# Patient Record
Sex: Male | Born: 1935
Health system: Southern US, Community
[De-identification: ages and names within clinical notes are randomized; demographics above are authoritative.]

## PROBLEM LIST (undated history)

## (undated) DIAGNOSIS — M6282 Rhabdomyolysis: Secondary | ICD-10-CM

## (undated) DIAGNOSIS — H9190 Unspecified hearing loss, unspecified ear: Secondary | ICD-10-CM

## (undated) DIAGNOSIS — I951 Orthostatic hypotension: Secondary | ICD-10-CM

## (undated) DIAGNOSIS — B004 Herpesviral encephalitis: Secondary | ICD-10-CM

## (undated) DIAGNOSIS — R911 Solitary pulmonary nodule: Secondary | ICD-10-CM

## (undated) DIAGNOSIS — I480 Paroxysmal atrial fibrillation: Secondary | ICD-10-CM

## (undated) DIAGNOSIS — K5790 Diverticulosis of intestine, part unspecified, without perforation or abscess without bleeding: Secondary | ICD-10-CM

## (undated) DIAGNOSIS — F0390 Unspecified dementia without behavioral disturbance: Secondary | ICD-10-CM

## (undated) DIAGNOSIS — E785 Hyperlipidemia, unspecified: Secondary | ICD-10-CM

## (undated) DIAGNOSIS — R002 Palpitations: Secondary | ICD-10-CM

## (undated) DIAGNOSIS — R413 Other amnesia: Secondary | ICD-10-CM

## (undated) DIAGNOSIS — I2510 Atherosclerotic heart disease of native coronary artery without angina pectoris: Secondary | ICD-10-CM

## (undated) DIAGNOSIS — E871 Hypo-osmolality and hyponatremia: Secondary | ICD-10-CM

## (undated) DIAGNOSIS — R0602 Shortness of breath: Secondary | ICD-10-CM

## (undated) DIAGNOSIS — E119 Type 2 diabetes mellitus without complications: Secondary | ICD-10-CM

## (undated) HISTORY — DX: Type 2 diabetes mellitus without complications: E11.9

## (undated) HISTORY — PX: HERNIA REPAIR: SHX51

## (undated) HISTORY — DX: Atherosclerotic heart disease of native coronary artery without angina pectoris: I25.10

## (undated) HISTORY — PX: TONSILLECTOMY: SUR1361

## (undated) HISTORY — DX: Unspecified hearing loss, unspecified ear: H91.90

## (undated) HISTORY — DX: Hyperlipidemia, unspecified: E78.5

## (undated) HISTORY — DX: Unspecified dementia without behavioral disturbance: F03.90

## (undated) HISTORY — DX: Paroxysmal atrial fibrillation: I48.0

## (undated) HISTORY — DX: Hypo-osmolality and hyponatremia: E87.1

## (undated) HISTORY — DX: Rhabdomyolysis: M62.82

## (undated) HISTORY — DX: Herpesviral encephalitis: B00.4

## (undated) HISTORY — DX: Palpitations: R00.2

## (undated) HISTORY — DX: Shortness of breath: R06.02

## (undated) HISTORY — DX: Orthostatic hypotension: I95.1

## (undated) HISTORY — DX: Solitary pulmonary nodule: R91.1

## (undated) HISTORY — DX: Other amnesia: R41.3

## (undated) HISTORY — DX: Diverticulosis of intestine, part unspecified, without perforation or abscess without bleeding: K57.90

---

## 2001-09-28 ENCOUNTER — Emergency Department (HOSPITAL_COMMUNITY): Admission: EM | Admit: 2001-09-28 | Discharge: 2001-09-28 | Payer: Self-pay | Admitting: *Deleted

## 2001-09-28 ENCOUNTER — Encounter: Payer: Self-pay | Admitting: *Deleted

## 2002-05-28 ENCOUNTER — Encounter: Payer: Self-pay | Admitting: Family Medicine

## 2002-05-28 ENCOUNTER — Ambulatory Visit (HOSPITAL_COMMUNITY): Admission: RE | Admit: 2002-05-28 | Discharge: 2002-05-28 | Payer: Self-pay | Admitting: Family Medicine

## 2003-01-07 ENCOUNTER — Encounter (INDEPENDENT_AMBULATORY_CARE_PROVIDER_SITE_OTHER): Payer: Self-pay | Admitting: *Deleted

## 2003-01-07 ENCOUNTER — Ambulatory Visit (HOSPITAL_COMMUNITY): Admission: RE | Admit: 2003-01-07 | Discharge: 2003-01-07 | Payer: Self-pay | Admitting: Internal Medicine

## 2005-06-18 ENCOUNTER — Ambulatory Visit (HOSPITAL_COMMUNITY): Admission: RE | Admit: 2005-06-18 | Discharge: 2005-06-18 | Payer: Self-pay | Admitting: Family Medicine

## 2005-06-21 ENCOUNTER — Ambulatory Visit (HOSPITAL_COMMUNITY): Admission: RE | Admit: 2005-06-21 | Discharge: 2005-06-21 | Payer: Self-pay | Admitting: Family Medicine

## 2005-10-10 ENCOUNTER — Ambulatory Visit (HOSPITAL_COMMUNITY): Admission: RE | Admit: 2005-10-10 | Discharge: 2005-10-10 | Payer: Self-pay | Admitting: *Deleted

## 2006-05-06 ENCOUNTER — Ambulatory Visit: Payer: Self-pay | Admitting: Critical Care Medicine

## 2006-05-19 ENCOUNTER — Ambulatory Visit: Payer: Self-pay | Admitting: Critical Care Medicine

## 2006-05-19 ENCOUNTER — Encounter (INDEPENDENT_AMBULATORY_CARE_PROVIDER_SITE_OTHER): Payer: Self-pay | Admitting: *Deleted

## 2006-05-19 ENCOUNTER — Ambulatory Visit: Admission: RE | Admit: 2006-05-19 | Discharge: 2006-05-19 | Payer: Self-pay | Admitting: Critical Care Medicine

## 2006-06-02 ENCOUNTER — Ambulatory Visit (HOSPITAL_COMMUNITY): Admission: RE | Admit: 2006-06-02 | Discharge: 2006-06-02 | Payer: Self-pay | Admitting: Critical Care Medicine

## 2006-08-28 ENCOUNTER — Ambulatory Visit: Payer: Self-pay | Admitting: Critical Care Medicine

## 2006-08-28 LAB — CONVERTED CEMR LAB
CO2: 29 meq/L (ref 19–32)
Calcium: 8.9 mg/dL (ref 8.4–10.5)
Chloride: 102 meq/L (ref 96–112)
GFR calc Af Amer: 122 mL/min
Glucose, Bld: 149 mg/dL — ABNORMAL HIGH (ref 70–99)
Sodium: 136 meq/L (ref 135–145)

## 2006-09-01 ENCOUNTER — Ambulatory Visit (HOSPITAL_COMMUNITY): Admission: RE | Admit: 2006-09-01 | Discharge: 2006-09-01 | Payer: Self-pay | Admitting: Critical Care Medicine

## 2006-09-10 ENCOUNTER — Ambulatory Visit (HOSPITAL_COMMUNITY): Admission: RE | Admit: 2006-09-10 | Discharge: 2006-09-10 | Payer: Self-pay | Admitting: Family Medicine

## 2006-12-01 ENCOUNTER — Ambulatory Visit: Payer: Self-pay | Admitting: Critical Care Medicine

## 2006-12-01 LAB — CONVERTED CEMR LAB
Chloride: 104 meq/L (ref 96–112)
Creatinine, Ser: 0.8 mg/dL (ref 0.4–1.5)
Glucose, Bld: 169 mg/dL — ABNORMAL HIGH (ref 70–99)
Potassium: 4.3 meq/L (ref 3.5–5.1)
Sodium: 139 meq/L (ref 135–145)

## 2006-12-02 ENCOUNTER — Ambulatory Visit: Payer: Self-pay | Admitting: Cardiology

## 2007-07-15 ENCOUNTER — Ambulatory Visit (HOSPITAL_COMMUNITY): Admission: RE | Admit: 2007-07-15 | Discharge: 2007-07-15 | Payer: Self-pay | Admitting: *Deleted

## 2007-07-22 ENCOUNTER — Encounter: Payer: Self-pay | Admitting: Cardiovascular Disease

## 2007-08-17 ENCOUNTER — Encounter (INDEPENDENT_AMBULATORY_CARE_PROVIDER_SITE_OTHER): Payer: Self-pay | Admitting: *Deleted

## 2007-08-17 DIAGNOSIS — J984 Other disorders of lung: Secondary | ICD-10-CM | POA: Insufficient documentation

## 2007-09-10 ENCOUNTER — Ambulatory Visit: Payer: Self-pay | Admitting: Cardiology

## 2007-09-11 DIAGNOSIS — I4891 Unspecified atrial fibrillation: Secondary | ICD-10-CM | POA: Insufficient documentation

## 2007-09-11 DIAGNOSIS — E119 Type 2 diabetes mellitus without complications: Secondary | ICD-10-CM | POA: Insufficient documentation

## 2007-09-14 ENCOUNTER — Ambulatory Visit: Payer: Self-pay | Admitting: Critical Care Medicine

## 2007-09-14 DIAGNOSIS — I10 Essential (primary) hypertension: Secondary | ICD-10-CM | POA: Insufficient documentation

## 2007-10-08 ENCOUNTER — Encounter: Payer: Self-pay | Admitting: Critical Care Medicine

## 2007-11-23 IMAGING — CT CT PELVIS W/ CM
2 of 5 series · 15 of 46 positions shown, 17 images · IV contrast (Omnipaque 300)
Comparison: None.

CLINICAL DATA: Abdominal pain. 
 ABDOMEN CT WITH CONTRAST ([DATE] HOURS):
TECHNIQUE: Multidetector CT imaging of the abdomen was performed following the standard protocol during bolus administration of intravenous contrast.
 Contrast:  100 cc Omnipaque 300
TECHNIQUE: Multidetector CT imaging of the pelvis was performed following the standard protocol during bolus administration of intravenous contrast.

[Series 2: abd_pel 5.0 b40f · axial · 0.77mm/px · z∈[-484,-88]mm · 12 of 95 slices shown, 14 images]
[im 8/95  soft-tissue]
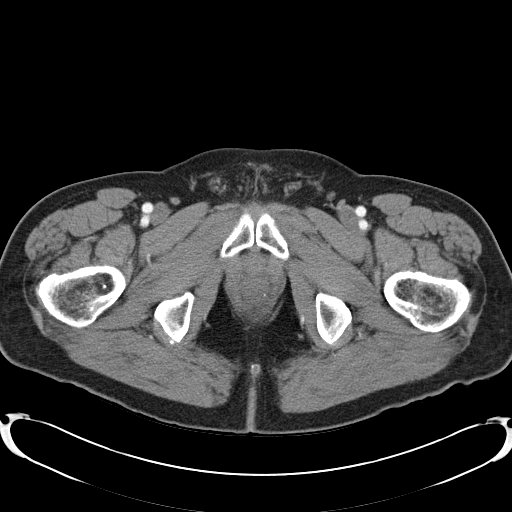
[im 8/95  bone]
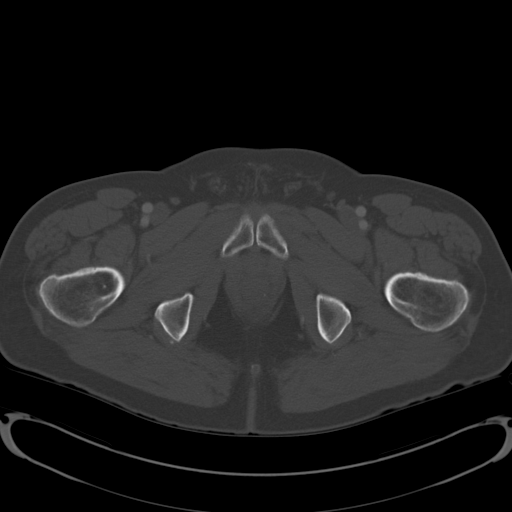
[im 15/95  soft-tissue]
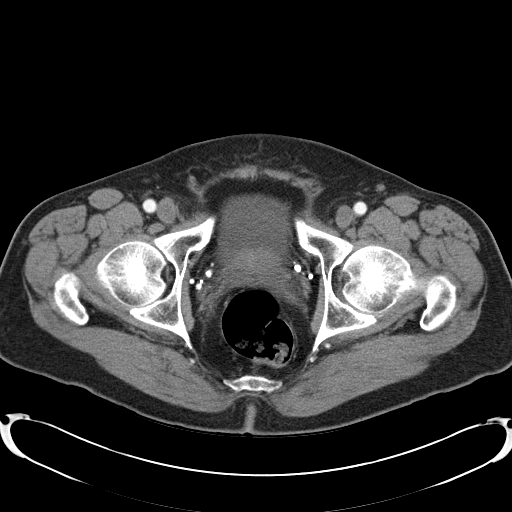
[im 22/95  soft-tissue]
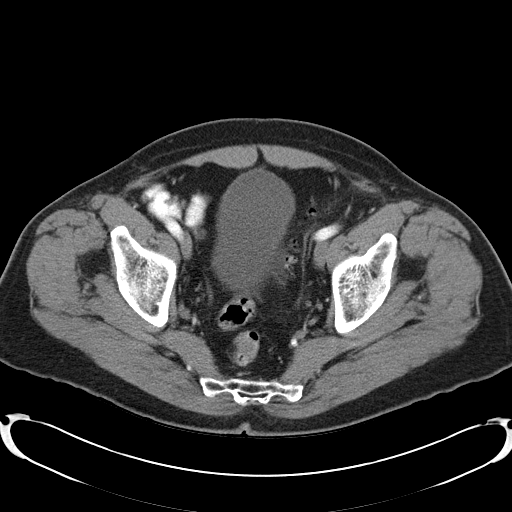
[im 29/95  soft-tissue]
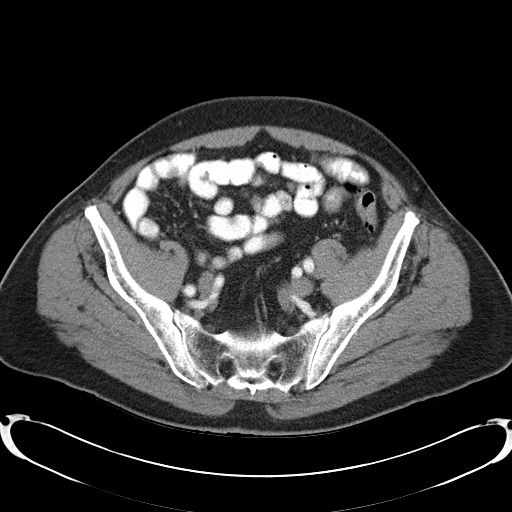
[im 37/95  soft-tissue]
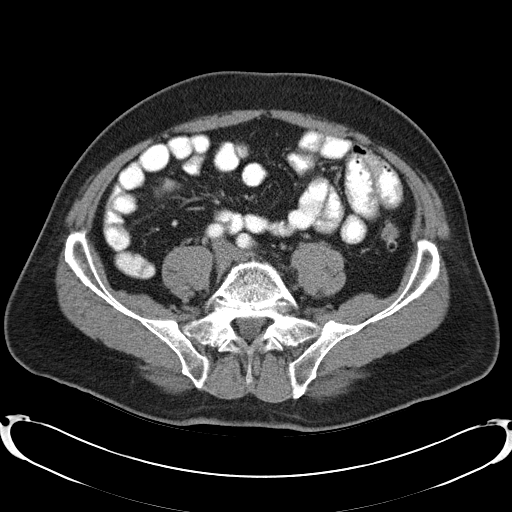
[im 44/95  soft-tissue]
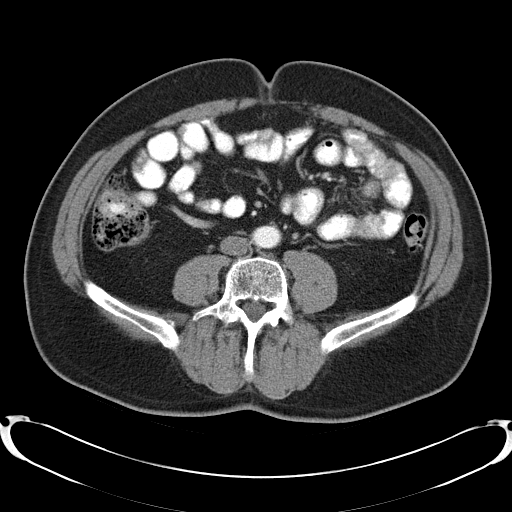
[im 51/95  soft-tissue]
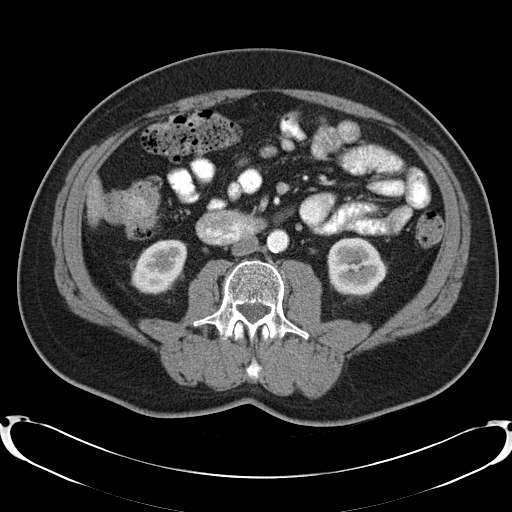
[im 58/95  soft-tissue]
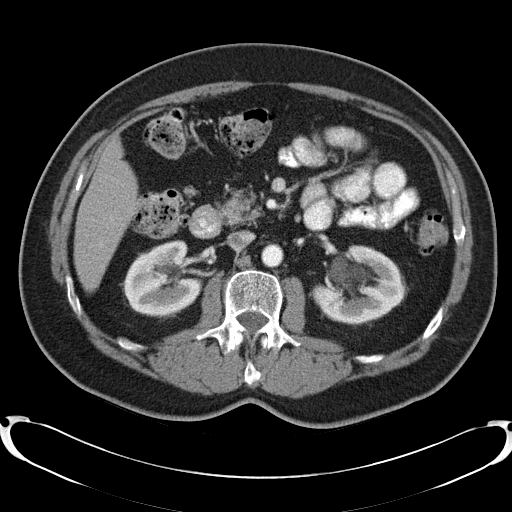
[im 66/95  soft-tissue]
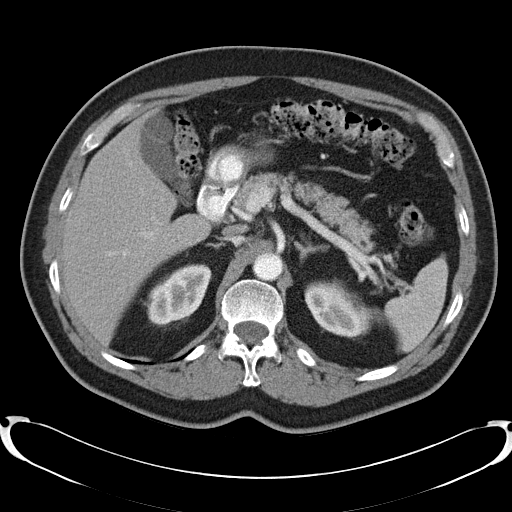
[im 66/95  bone]
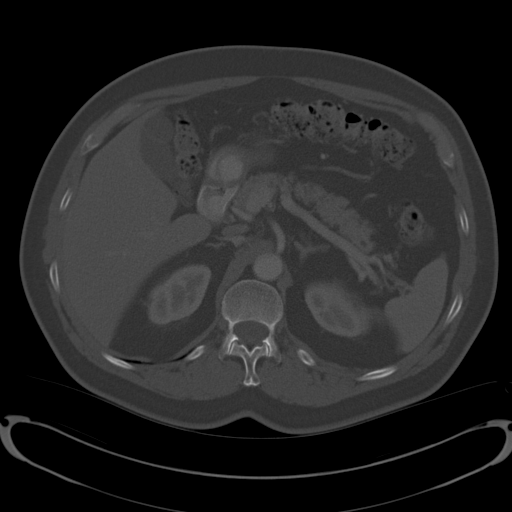
[im 73/95  soft-tissue]
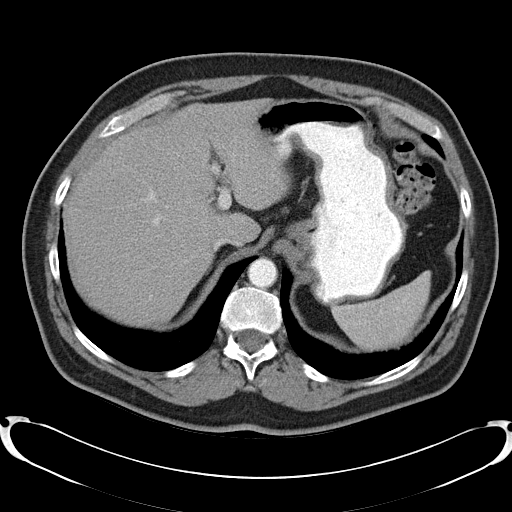
[im 80/95  soft-tissue]
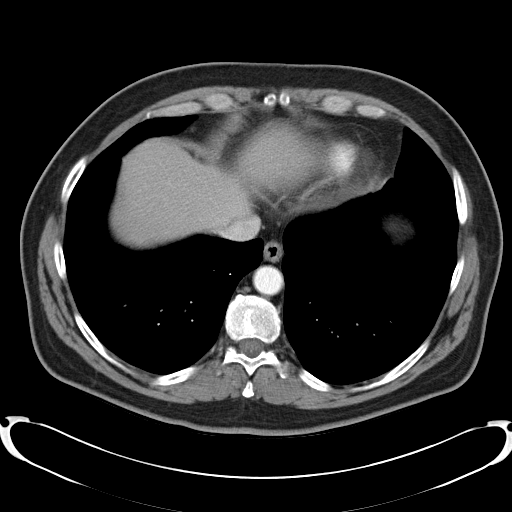
[im 87/95  soft-tissue]
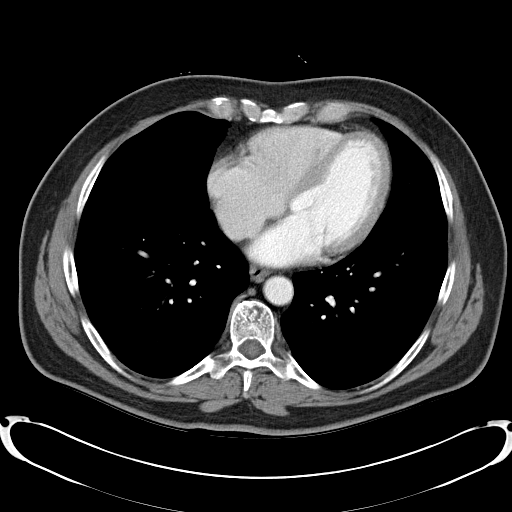

[Series 4: mpr coronal a/p · coronal · 0.73mm/px · 3 of 81 slices shown]
[im 27/81  soft-tissue]
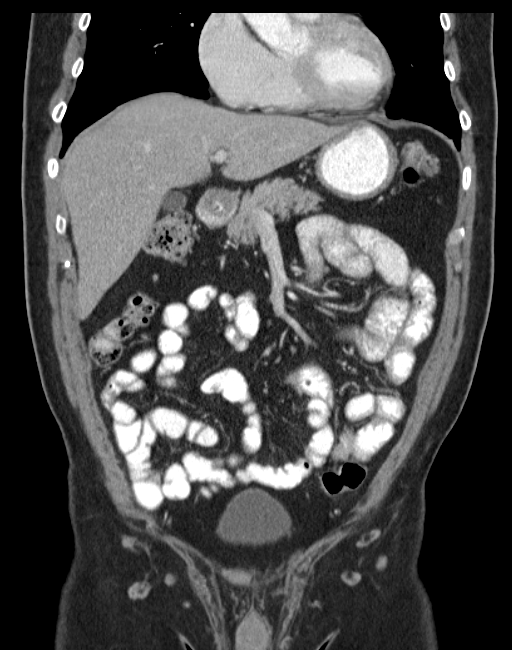
[im 36/81  soft-tissue]
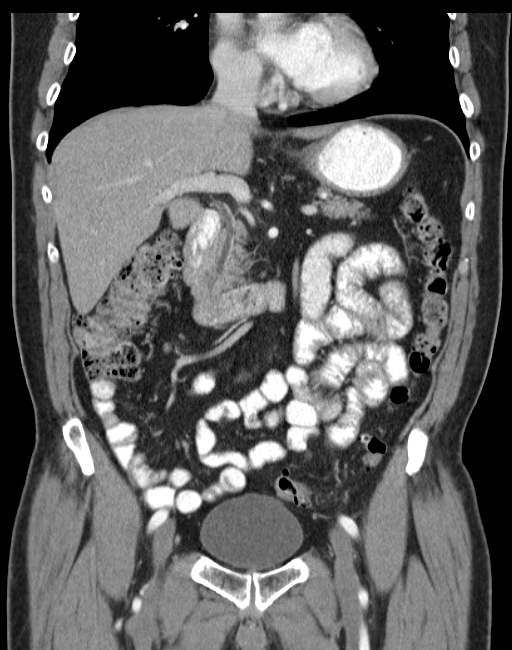
[im 45/81  soft-tissue]
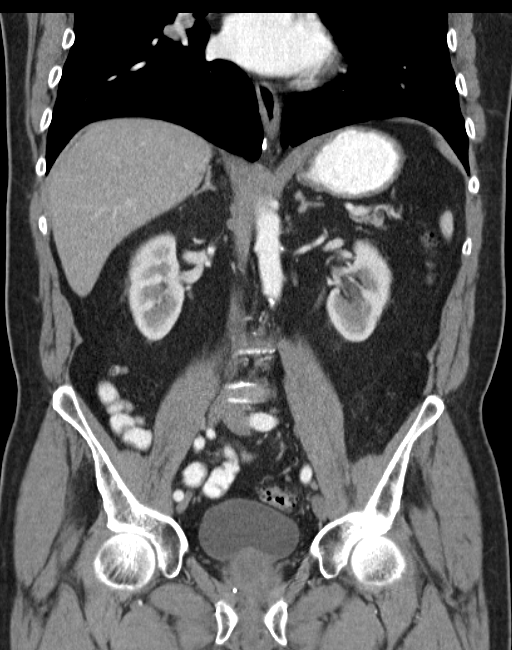

[15 of 46 positions shown; findings below may reference images not displayed]

FINDINGS: A left lower lobe nodule on image 17 is unchanged compared to the chest CT from 09/01/06. 
 Calcified gallstones are again present.  Stool is seen throughout the colon.  The spleen, adrenal glands, and pancreas are within normal limits.  Bilateral pelvic cysts are seen in the kidneys.  Negative free fluid or abnormal adenopathy.
IMPRESSION: 1.  Stable left lower lobe nodule.
 2.  Cholelithiasis. 
 3.  Prominent stool throughout the colon. 
 PELVIS CT WITH CONTRAST:
FINDINGS: The appendix is normal.  Sigmoid diverticulosis without evidence of diverticulitis is present.  The bladder and prostate are within normal limits.  Negative free fluid or abnormal adenopathy.  Spondylolisthesis grade I at L5-S1 is stable.
IMPRESSION: No acute intrapelvic pathology.  Stable exam.

## 2008-08-11 ENCOUNTER — Encounter (HOSPITAL_COMMUNITY): Admission: RE | Admit: 2008-08-11 | Discharge: 2008-09-10 | Payer: Self-pay | Admitting: Family Medicine

## 2008-08-16 ENCOUNTER — Encounter: Payer: Self-pay | Admitting: Internal Medicine

## 2008-09-08 ENCOUNTER — Encounter: Payer: Self-pay | Admitting: Internal Medicine

## 2008-12-13 ENCOUNTER — Encounter: Payer: Self-pay | Admitting: Internal Medicine

## 2008-12-14 ENCOUNTER — Encounter: Payer: Self-pay | Admitting: Internal Medicine

## 2008-12-14 ENCOUNTER — Encounter (INDEPENDENT_AMBULATORY_CARE_PROVIDER_SITE_OTHER): Payer: Self-pay | Admitting: *Deleted

## 2008-12-14 ENCOUNTER — Ambulatory Visit (HOSPITAL_COMMUNITY): Admission: RE | Admit: 2008-12-14 | Discharge: 2008-12-14 | Payer: Self-pay | Admitting: Family Medicine

## 2008-12-19 ENCOUNTER — Encounter: Payer: Self-pay | Admitting: Internal Medicine

## 2009-01-10 ENCOUNTER — Encounter: Payer: Self-pay | Admitting: Internal Medicine

## 2009-03-01 ENCOUNTER — Ambulatory Visit: Payer: Self-pay | Admitting: Internal Medicine

## 2009-03-01 DIAGNOSIS — K5732 Diverticulitis of large intestine without perforation or abscess without bleeding: Secondary | ICD-10-CM | POA: Insufficient documentation

## 2010-01-30 ENCOUNTER — Inpatient Hospital Stay (HOSPITAL_COMMUNITY): Admission: EM | Admit: 2010-01-30 | Discharge: 2010-02-12 | Payer: Self-pay | Admitting: Emergency Medicine

## 2010-02-13 ENCOUNTER — Emergency Department (HOSPITAL_COMMUNITY): Admission: EM | Admit: 2010-02-13 | Discharge: 2010-02-13 | Payer: Self-pay | Admitting: Emergency Medicine

## 2010-02-27 ENCOUNTER — Observation Stay (HOSPITAL_COMMUNITY): Admission: EM | Admit: 2010-02-27 | Discharge: 2010-03-01 | Payer: Self-pay | Admitting: Emergency Medicine

## 2010-03-14 ENCOUNTER — Encounter (HOSPITAL_COMMUNITY): Admission: RE | Admit: 2010-03-14 | Discharge: 2010-03-16 | Payer: Self-pay | Admitting: Internal Medicine

## 2010-03-14 ENCOUNTER — Ambulatory Visit (HOSPITAL_COMMUNITY): Payer: Self-pay | Admitting: Internal Medicine

## 2010-03-20 ENCOUNTER — Ambulatory Visit (HOSPITAL_COMMUNITY): Payer: Self-pay | Admitting: Cardiology

## 2010-03-20 ENCOUNTER — Encounter (HOSPITAL_COMMUNITY): Admission: RE | Admit: 2010-03-20 | Discharge: 2010-03-30 | Payer: Self-pay | Admitting: Cardiology

## 2010-03-29 ENCOUNTER — Ambulatory Visit (HOSPITAL_COMMUNITY): Admission: RE | Admit: 2010-03-29 | Discharge: 2010-03-29 | Payer: Self-pay | Admitting: Neurology

## 2010-04-06 ENCOUNTER — Ambulatory Visit (HOSPITAL_COMMUNITY): Admission: RE | Admit: 2010-04-06 | Discharge: 2010-04-06 | Payer: Self-pay | Admitting: Neurology

## 2010-04-11 ENCOUNTER — Encounter: Payer: Self-pay | Admitting: Cardiovascular Disease

## 2010-06-19 ENCOUNTER — Encounter
Admission: RE | Admit: 2010-06-19 | Discharge: 2010-06-19 | Payer: Self-pay | Source: Home / Self Care | Attending: Neurology | Admitting: Neurology

## 2010-06-21 ENCOUNTER — Encounter
Admission: RE | Admit: 2010-06-21 | Discharge: 2010-06-21 | Payer: Self-pay | Source: Home / Self Care | Attending: Neurology | Admitting: Neurology

## 2010-09-13 LAB — GLUCOSE, CAPILLARY: Glucose-Capillary: 77 mg/dL (ref 70–99)

## 2010-09-13 LAB — BASIC METABOLIC PANEL
Chloride: 112 mEq/L (ref 96–112)
GFR calc Af Amer: >60 mL/min (ref >60)
Potassium: 4.8 mEq/L (ref 3.5–5.1)

## 2010-09-13 LAB — CREATININE, SERUM: GFR calc non Af Amer: >60 mL/min (ref >60)

## 2010-09-14 LAB — CBC
HCT: 27.9 % — ABNORMAL LOW (ref 39.0–52.0)
HCT: 29.6 % — ABNORMAL LOW (ref 39.0–52.0)
HCT: 29.7 % — ABNORMAL LOW (ref 39.0–52.0)
HCT: 31.2 % — ABNORMAL LOW (ref 39.0–52.0)
HCT: 35 % — ABNORMAL LOW (ref 39.0–52.0)
HCT: 35.5 % — ABNORMAL LOW (ref 39.0–52.0)
Hemoglobin: 10.2 g/dL — ABNORMAL LOW (ref 13.0–17.0)
Hemoglobin: 10.2 g/dL — ABNORMAL LOW (ref 13.0–17.0)
Hemoglobin: 10.7 g/dL — ABNORMAL LOW (ref 13.0–17.0)
Hemoglobin: 11.4 g/dL — ABNORMAL LOW (ref 13.0–17.0)
Hemoglobin: 9.6 g/dL — ABNORMAL LOW (ref 13.0–17.0)
MCH: 31.7 pg (ref 26.0–34.0)
MCH: 31.9 pg (ref 26.0–34.0)
MCH: 31.9 pg (ref 26.0–34.0)
MCH: 32.1 pg (ref 26.0–34.0)
MCH: 32.6 pg (ref 26.0–34.0)
MCHC: 34 g/dL (ref 30.0–36.0)
MCHC: 34.1 g/dL (ref 30.0–36.0)
MCHC: 34.3 g/dL (ref 30.0–36.0)
MCHC: 34.4 g/dL (ref 30.0–36.0)
MCHC: 34.5 g/dL (ref 30.0–36.0)
MCHC: 34.5 g/dL (ref 30.0–36.0)
MCHC: 34.6 g/dL (ref 30.0–36.0)
MCV: 96.1 fL (ref 78.0–100.0)
MCV: 96.4 fL (ref 78.0–100.0)
MCV: 92.6 fL (ref 78.0–100.0)
MCV: 92.7 fL (ref 78.0–100.0)
MCV: 92.7 fL (ref 78.0–100.0)
MCV: 92.8 fL (ref 78.0–100.0)
MCV: 93.3 fL (ref 78.0–100.0)
Platelets: 190 10*3/uL (ref 150–400)
Platelets: 209 10*3/uL (ref 150–400)
Platelets: 163 10*3/uL (ref 150–400)
Platelets: 187 10*3/uL (ref 150–400)
Platelets: 225 10*3/uL (ref 150–400)
RBC: 3.51 MIL/uL — ABNORMAL LOW (ref 4.22–5.81)
RBC: 3.01 MIL/uL — ABNORMAL LOW (ref 4.22–5.81)
RBC: 3.19 MIL/uL — ABNORMAL LOW (ref 4.22–5.81)
RBC: 3.21 MIL/uL — ABNORMAL LOW (ref 4.22–5.81)
RBC: 3.36 MIL/uL — ABNORMAL LOW (ref 4.22–5.81)
RDW: 16.6 % — ABNORMAL HIGH (ref 11.5–15.5)
RDW: 17.4 % — ABNORMAL HIGH (ref 11.5–15.5)
RDW: 12.5 % (ref 11.5–15.5)
RDW: 12.6 % (ref 11.5–15.5)
RDW: 12.8 % (ref 11.5–15.5)
RDW: 13 % (ref 11.5–15.5)
RDW: 13.1 % (ref 11.5–15.5)
RDW: 13.2 % (ref 11.5–15.5)
RDW: 13.3 % (ref 11.5–15.5)
RDW: 13.3 % (ref 11.5–15.5)
WBC: 10 10*3/uL (ref 4.0–10.5)
WBC: 6.9 10*3/uL (ref 4.0–10.5)
WBC: 5.2 10*3/uL (ref 4.0–10.5)
WBC: 5.6 10*3/uL (ref 4.0–10.5)
WBC: 5.8 10*3/uL (ref 4.0–10.5)
WBC: 6.4 10*3/uL (ref 4.0–10.5)

## 2010-09-14 LAB — GLUCOSE, CAPILLARY
Glucose-Capillary: 112 mg/dL — ABNORMAL HIGH (ref 70–99)
Glucose-Capillary: 116 mg/dL — ABNORMAL HIGH (ref 70–99)
Glucose-Capillary: 169 mg/dL — ABNORMAL HIGH (ref 70–99)
Glucose-Capillary: 278 mg/dL — ABNORMAL HIGH (ref 70–99)
Glucose-Capillary: 335 mg/dL — ABNORMAL HIGH (ref 70–99)
Glucose-Capillary: 122 mg/dL — ABNORMAL HIGH (ref 70–99)
Glucose-Capillary: 131 mg/dL — ABNORMAL HIGH (ref 70–99)
Glucose-Capillary: 143 mg/dL — ABNORMAL HIGH (ref 70–99)
Glucose-Capillary: 144 mg/dL — ABNORMAL HIGH (ref 70–99)
Glucose-Capillary: 145 mg/dL — ABNORMAL HIGH (ref 70–99)
Glucose-Capillary: 152 mg/dL — ABNORMAL HIGH (ref 70–99)
Glucose-Capillary: 155 mg/dL — ABNORMAL HIGH (ref 70–99)
Glucose-Capillary: 158 mg/dL — ABNORMAL HIGH (ref 70–99)
Glucose-Capillary: 176 mg/dL — ABNORMAL HIGH (ref 70–99)
Glucose-Capillary: 184 mg/dL — ABNORMAL HIGH (ref 70–99)
Glucose-Capillary: 223 mg/dL — ABNORMAL HIGH (ref 70–99)
Glucose-Capillary: 231 mg/dL — ABNORMAL HIGH (ref 70–99)
Glucose-Capillary: 272 mg/dL — ABNORMAL HIGH (ref 70–99)
Glucose-Capillary: 315 mg/dL — ABNORMAL HIGH (ref 70–99)

## 2010-09-14 LAB — BASIC METABOLIC PANEL
BUN: 13 mg/dL (ref 6–23)
BUN: 20 mg/dL (ref 6–23)
BUN: 4 mg/dL — ABNORMAL LOW (ref 6–23)
BUN: 4 mg/dL — ABNORMAL LOW (ref 6–23)
BUN: 5 mg/dL — ABNORMAL LOW (ref 6–23)
BUN: 5 mg/dL — ABNORMAL LOW (ref 6–23)
CO2: 22 mEq/L (ref 19–32)
CO2: 23 mEq/L (ref 19–32)
CO2: 23 mEq/L (ref 19–32)
CO2: 24 mEq/L (ref 19–32)
Calcium: 8.9 mg/dL (ref 8.4–10.5)
Calcium: 7.6 mg/dL — ABNORMAL LOW (ref 8.4–10.5)
Calcium: 7.6 mg/dL — ABNORMAL LOW (ref 8.4–10.5)
Calcium: 7.9 mg/dL — ABNORMAL LOW (ref 8.4–10.5)
Calcium: 8.2 mg/dL — ABNORMAL LOW (ref 8.4–10.5)
Calcium: 8.6 mg/dL (ref 8.4–10.5)
Calcium: 8.7 mg/dL (ref 8.4–10.5)
Chloride: 96 mEq/L (ref 96–112)
Chloride: 100 mEq/L (ref 96–112)
Chloride: 104 mEq/L (ref 96–112)
Chloride: 105 mEq/L (ref 96–112)
Creatinine, Ser: 1.2 mg/dL (ref 0.4–1.5)
Creatinine, Ser: 0.78 mg/dL (ref 0.4–1.5)
Creatinine, Ser: 0.78 mg/dL (ref 0.4–1.5)
Creatinine, Ser: 0.78 mg/dL (ref 0.4–1.5)
Creatinine, Ser: 0.85 mg/dL (ref 0.4–1.5)
Creatinine, Ser: 0.88 mg/dL (ref 0.4–1.5)
Creatinine, Ser: 0.95 mg/dL (ref 0.4–1.5)
GFR calc Af Amer: >60 mL/min (ref >60)
GFR calc Af Amer: >60 mL/min (ref >60)
GFR calc Af Amer: >60 mL/min (ref >60)
GFR calc Af Amer: >60 mL/min (ref >60)
GFR calc Af Amer: >60 mL/min (ref >60)
GFR calc non Af Amer: 59 mL/min — ABNORMAL LOW (ref >60)
GFR calc non Af Amer: >60 mL/min (ref >60)
GFR calc non Af Amer: >60 mL/min (ref >60)
GFR calc non Af Amer: >60 mL/min (ref >60)
GFR calc non Af Amer: >60 mL/min (ref >60)
GFR calc non Af Amer: >60 mL/min (ref >60)
GFR calc non Af Amer: >60 mL/min (ref >60)
GFR calc non Af Amer: >60 mL/min (ref >60)
Glucose, Bld: 111 mg/dL — ABNORMAL HIGH (ref 70–99)
Glucose, Bld: 119 mg/dL — ABNORMAL HIGH (ref 70–99)
Glucose, Bld: 132 mg/dL — ABNORMAL HIGH (ref 70–99)
Glucose, Bld: 134 mg/dL — ABNORMAL HIGH (ref 70–99)
Glucose, Bld: 147 mg/dL — ABNORMAL HIGH (ref 70–99)
Glucose, Bld: 92 mg/dL (ref 70–99)
Glucose, Bld: 95 mg/dL (ref 70–99)
Potassium: 3.1 mEq/L — ABNORMAL LOW (ref 3.5–5.1)
Potassium: 3.2 mEq/L — ABNORMAL LOW (ref 3.5–5.1)
Potassium: 3.4 mEq/L — ABNORMAL LOW (ref 3.5–5.1)
Potassium: 3.6 mEq/L (ref 3.5–5.1)
Sodium: 131 mEq/L — ABNORMAL LOW (ref 135–145)
Sodium: 133 mEq/L — ABNORMAL LOW (ref 135–145)
Sodium: 133 mEq/L — ABNORMAL LOW (ref 135–145)
Sodium: 134 mEq/L — ABNORMAL LOW (ref 135–145)
Sodium: 136 mEq/L (ref 135–145)
Sodium: 136 mEq/L (ref 135–145)

## 2010-09-14 LAB — DIFFERENTIAL
Basophils Absolute: 0 10*3/uL (ref 0.0–0.1)
Basophils Absolute: 0 10*3/uL (ref 0.0–0.1)
Basophils Absolute: 0 10*3/uL (ref 0.0–0.1)
Basophils Absolute: 0 10*3/uL (ref 0.0–0.1)
Basophils Absolute: 0 10*3/uL (ref 0.0–0.1)
Basophils Absolute: 0 10*3/uL (ref 0.0–0.1)
Basophils Absolute: 0 10*3/uL (ref 0.0–0.1)
Basophils Absolute: 0.1 10*3/uL (ref 0.0–0.1)
Basophils Relative: 0 % (ref 0–1)
Basophils Relative: 0 % (ref 0–1)
Basophils Relative: 0 % (ref 0–1)
Basophils Relative: 1 % (ref 0–1)
Basophils Relative: 1 % (ref 0–1)
Basophils Relative: 1 % (ref 0–1)
Basophils Relative: 1 % (ref 0–1)
Eosinophils Absolute: 0.1 10*3/uL (ref 0.0–0.7)
Eosinophils Absolute: 0.5 10*3/uL (ref 0.0–0.7)
Eosinophils Absolute: 0.1 10*3/uL (ref 0.0–0.7)
Eosinophils Absolute: 0.4 10*3/uL (ref 0.0–0.7)
Eosinophils Absolute: 0.4 10*3/uL (ref 0.0–0.7)
Eosinophils Absolute: 0.4 10*3/uL (ref 0.0–0.7)
Eosinophils Absolute: 0.4 10*3/uL (ref 0.0–0.7)
Eosinophils Relative: 1 % (ref 0–5)
Eosinophils Relative: 0 % (ref 0–5)
Eosinophils Relative: 1 % (ref 0–5)
Eosinophils Relative: 6 % — ABNORMAL HIGH (ref 0–5)
Eosinophils Relative: 7 % — ABNORMAL HIGH (ref 0–5)
Eosinophils Relative: 7 % — ABNORMAL HIGH (ref 0–5)
Lymphocytes Relative: 13 % (ref 12–46)
Lymphocytes Relative: 16 % (ref 12–46)
Lymphocytes Relative: 17 % (ref 12–46)
Lymphocytes Relative: 19 % (ref 12–46)
Lymphocytes Relative: 23 % (ref 12–46)
Lymphs Abs: 1.7 10*3/uL (ref 0.7–4.0)
Lymphs Abs: 0.9 10*3/uL (ref 0.7–4.0)
Lymphs Abs: 1 10*3/uL (ref 0.7–4.0)
Lymphs Abs: 1 10*3/uL (ref 0.7–4.0)
Lymphs Abs: 1.6 10*3/uL (ref 0.7–4.0)
Monocytes Absolute: 0.6 10*3/uL (ref 0.1–1.0)
Monocytes Absolute: 0.5 10*3/uL (ref 0.1–1.0)
Monocytes Absolute: 0.5 10*3/uL (ref 0.1–1.0)
Monocytes Absolute: 0.6 10*3/uL (ref 0.1–1.0)
Monocytes Absolute: 0.6 10*3/uL (ref 0.1–1.0)
Monocytes Absolute: 0.8 10*3/uL (ref 0.1–1.0)
Monocytes Relative: 8 % (ref 3–12)
Monocytes Relative: 10 % (ref 3–12)
Monocytes Relative: 10 % (ref 3–12)
Monocytes Relative: 10 % (ref 3–12)
Monocytes Relative: 11 % (ref 3–12)
Monocytes Relative: 7 % (ref 3–12)
Monocytes Relative: 7 % (ref 3–12)
Monocytes Relative: 8 % (ref 3–12)
Monocytes Relative: 9 % (ref 3–12)
Neutro Abs: 3.2 10*3/uL (ref 1.7–7.7)
Neutro Abs: 3.8 10*3/uL (ref 1.7–7.7)
Neutro Abs: 3.8 10*3/uL (ref 1.7–7.7)
Neutro Abs: 4.9 10*3/uL (ref 1.7–7.7)
Neutro Abs: 5 10*3/uL (ref 1.7–7.7)
Neutro Abs: 8.8 10*3/uL — ABNORMAL HIGH (ref 1.7–7.7)
Neutrophils Relative %: 58 % (ref 43–77)
Neutrophils Relative %: 62 % (ref 43–77)
Neutrophils Relative %: 66 % (ref 43–77)
Neutrophils Relative %: 67 % (ref 43–77)
Neutrophils Relative %: 77 % (ref 43–77)
Neutrophils Relative %: 78 % — ABNORMAL HIGH (ref 43–77)
Neutrophils Relative %: 84 % — ABNORMAL HIGH (ref 43–77)

## 2010-09-14 LAB — PROCALCITONIN: Procalcitonin: <0.5 ng/mL

## 2010-09-14 LAB — BLOOD GAS, ARTERIAL
Acid-base deficit: 0.1 mmol/L (ref 0.0–2.0)
Bicarbonate: 24.2 mEq/L — ABNORMAL HIGH (ref 20.0–24.0)
pH, Arterial: 7.392 (ref 7.350–7.450)
pO2, Arterial: 10.2 mmHg — CL (ref 80.0–100.0)

## 2010-09-14 LAB — HEPATIC FUNCTION PANEL
ALT: 23 U/L (ref 0–53)
AST: 25 U/L (ref 0–37)
Albumin: 3.5 g/dL (ref 3.5–5.2)
Alkaline Phosphatase: 50 U/L (ref 39–117)
Total Bilirubin: 0.7 mg/dL (ref 0.3–1.2)

## 2010-09-14 LAB — CSF CELL COUNT WITH DIFFERENTIAL
Eosinophils, CSF: 0 % (ref 0–1)
Lymphs, CSF: 74 % (ref 40–80)
Monocyte-Macrophage-Spinal Fluid: 26 % (ref 15–45)
RBC Count, CSF: 12 /mm3 — ABNORMAL HIGH
Tube #: 4
WBC, CSF: 383 /mm3 (ref 0–5)

## 2010-09-14 LAB — URINE CULTURE
Colony Count: 6000
Culture  Setup Time: 201108310049

## 2010-09-14 LAB — LACTIC ACID, PLASMA: Lactic Acid, Venous: 3.4 mmol/L — ABNORMAL HIGH (ref 0.5–2.2)

## 2010-09-14 LAB — PREPARE FRESH FROZEN PLASMA

## 2010-09-14 LAB — LIPID PANEL
LDL Cholesterol: 61 mg/dL (ref 0–99)
Total CHOL/HDL Ratio: 5.7 RATIO
Triglycerides: 164 mg/dL — ABNORMAL HIGH (ref <150)
VLDL: 33 mg/dL (ref 0–40)

## 2010-09-14 LAB — URINALYSIS, ROUTINE W REFLEX MICROSCOPIC
Bilirubin Urine: NEGATIVE
Glucose, UA: NEGATIVE mg/dL
Hgb urine dipstick: NEGATIVE
Ketones, ur: NEGATIVE mg/dL
Nitrite: NEGATIVE
Protein, ur: NEGATIVE mg/dL
Urobilinogen, UA: 0.2 mg/dL (ref 0.0–1.0)
pH: 5.5 (ref 5.0–8.0)

## 2010-09-14 LAB — COMPREHENSIVE METABOLIC PANEL
ALT: 31 U/L (ref 0–53)
AST: 20 U/L (ref 0–37)
Albumin: 3.2 g/dL — ABNORMAL LOW (ref 3.5–5.2)
Alkaline Phosphatase: 41 U/L (ref 39–117)
Alkaline Phosphatase: 30 U/L — ABNORMAL LOW (ref 39–117)
Alkaline Phosphatase: 36 U/L — ABNORMAL LOW (ref 39–117)
BUN: 8 mg/dL (ref 6–23)
BUN: 15 mg/dL (ref 6–23)
BUN: 7 mg/dL (ref 6–23)
CO2: 23 mEq/L (ref 19–32)
Calcium: 8.5 mg/dL (ref 8.4–10.5)
Chloride: 101 mEq/L (ref 96–112)
GFR calc Af Amer: >60 mL/min (ref >60)
GFR calc non Af Amer: >60 mL/min (ref >60)
Glucose, Bld: 128 mg/dL — ABNORMAL HIGH (ref 70–99)
Glucose, Bld: 203 mg/dL — ABNORMAL HIGH (ref 70–99)
Potassium: 3.2 mEq/L — ABNORMAL LOW (ref 3.5–5.1)
Potassium: 3 mEq/L — ABNORMAL LOW (ref 3.5–5.1)
Potassium: 3.9 mEq/L (ref 3.5–5.1)
Sodium: 137 mEq/L (ref 135–145)
Sodium: 131 mEq/L — ABNORMAL LOW (ref 135–145)
Total Bilirubin: 0.9 mg/dL (ref 0.3–1.2)
Total Protein: 5.6 g/dL — ABNORMAL LOW (ref 6.0–8.3)
Total Protein: 5.6 g/dL — ABNORMAL LOW (ref 6.0–8.3)
Total Protein: 6.3 g/dL (ref 6.0–8.3)

## 2010-09-14 LAB — CK TOTAL AND CKMB (NOT AT ARMC)
CK, MB: 10.2 ng/mL (ref 0.3–4.0)
CK, MB: 5.8 ng/mL — ABNORMAL HIGH (ref 0.3–4.0)
Relative Index: 0.4 (ref 0.0–2.5)
Total CK: 1680 U/L — ABNORMAL HIGH (ref 7–232)

## 2010-09-14 LAB — VARICELLA-ZOSTER BY PCR: Varicella-Zoster, PCR: NOT DETECTED

## 2010-09-14 LAB — RAPID URINE DRUG SCREEN, HOSP PERFORMED
Amphetamines: NOT DETECTED
Barbiturates: NOT DETECTED
Benzodiazepines: NOT DETECTED
Cocaine: NOT DETECTED
Opiates: NOT DETECTED

## 2010-09-14 LAB — HERPES SIMPLEX VIRUS(HSV) DNA BY PCR: HSV 2 DNA: NOT DETECTED

## 2010-09-14 LAB — AFB CULTURE WITH SMEAR (NOT AT ARMC)

## 2010-09-14 LAB — POCT CARDIAC MARKERS: Myoglobin, poc: 63.3 ng/mL (ref 12–200)

## 2010-09-14 LAB — ANA: Anti Nuclear Antibody(ANA): NEGATIVE

## 2010-09-14 LAB — CULTURE, BLOOD (ROUTINE X 2)

## 2010-09-14 LAB — ROCKY MTN SPOTTED FVR AB, IGG-BLOOD: RMSF IgG: 0.56 IV

## 2010-09-14 LAB — CSF CULTURE W GRAM STAIN

## 2010-09-14 LAB — TYPE AND SCREEN

## 2010-09-14 LAB — B. BURGDORFI ANTIBODIES: B burgdorferi Ab IgG+IgM: 0.48 {ISR}

## 2010-09-14 LAB — C-REACTIVE PROTEIN: CRP: 5.2 mg/dL — ABNORMAL HIGH (ref <0.6)

## 2010-11-16 NOTE — Op Note (Signed)
   NAME:  William Fisher, William Fisher                          ACCOUNT NO.:  [id number]   MEDICAL RECORD NO.:  [id number]                   PATIENT TYPE:  AMB   LOCATION:  DAY                                  FACILITY:  [facility]   PHYSICIAN:  Lionel December, M.D.                 DATE OF BIRTH:  [birth year]   DATE OF PROCEDURE:  01/07/2003  DATE OF DISCHARGE:                                 OPERATIVE REPORT   PROCEDURE:  Total colonoscopy.   INDICATIONS FOR PROCEDURE:  Mr. Ozment is a 77 year old Caucasian male who  has chronic or intermittent hematochezia possibly secondary to hemorrhoids.  He is undergoing colonoscopy to make sure that he does not have any other  lesion that might be causing his bleeding. The procedure is reviewed with  the patient, informed consent is obtained.   PREOP MEDICATIONS:  Demerol 50 mg IV, Versed 4 mg IV in divided dose.   INSTRUMENT:  Olympus video system.   FINDINGS:  The procedure performed in endoscopy suite. The patient's vital  signs and O2 saturations were monitored during the procedure and remained  stable. The patient was placed in the left lateral decubitus position and  rectal examination performed. This was within normal limits. The scope was  placed in the rectum and advanced under direct vision to the sigmoid colon  and beyond. Preparation was excellent. Multiple diverticula were noted at  the sigmoid colon with a few more in the descending and transverse colon.  The scope was passed to the cecum which was identified by the appendiceal  orifice and ileocecal valve. Pictures taken for the record. As the scope was  withdrawn, colonic mucosa was once again carefully examined and was normal  throughout. Rectal mucosa was also normal. The scope was retroflexed to  examine the anorectal junction and small hemorrhoids were noted below the  dentate line. The endoscope was straightened and withdrawn. The patient  tolerated the procedure well.   FINAL DIAGNOSES:  1. Pancolonic diverticulosis; however, most of the diverticula are at the     sigmoid colon.  2. Small external hemorrhoids.    RECOMMENDATIONS:  High fiber diet, Citrucel or equivalent one tablespoonful  daily. He may consider next screening exam in 10 years from now.                                               Lionel December, M.D.    NR/MEDQ  D:  01/07/2003  T:  01/07/2003  Job:  [id number]   cc:   Patrica Duel, M.D.  [address], [address]  [place]  [place] [address]  Fax: [phone]

## 2010-11-16 NOTE — Assessment & Plan Note (Signed)
[facility]                               PULMONARY OFFICE NOTE   RJ, PEDROSA                       MRN:          [id number]  DATE:05/06/2006                            DOB:          1932/04/12    CHIEF COMPLAINT:  Evaluate left lower lobe nodule.   HISTORY OF PRESENT ILLNESS:  This is a 77 year old white male who on routine  physical was found to have a nodule in the left lower lobe.  He originally  had a CT scan done 6 months ago, it was 4 mm in diameter.  Now on repeat  scan 3 weeks ago it was up to 6  mm in diameter.  It is noncalcified.  The  patient is a lifelong never smoker.  He currently has no symptoms of cough,  shortness of breath, or chest discomfort.  He has had some edema in the  lower extremities and denies any pneumonia.  He is not short of breath with  rest or with exertion.  He denies any hemoptysis or chest discomfort.  There  is no acid reflux, indigestion, loss of appetite.  There is no change in  weight, abdominal pain, dysphagia, sore throat, tooth or dental problems.  There is no nasal congestion, sneezing, itching, earache, or other  complaints.  He is a lifelong never smoker.  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   PAST MEDICAL HISTORY:  Medical; history of heart dysrhythmias with PVC's,  history of diabetes, otherwise noncontributory.   PAST SURGICAL HISTORY:  Herniorrhaphy 25 years ago, tonsillectomy at age 40.   ALLERGIES:  No known drug allergies.   CURRENT MEDICATIONS:  1. Atenolol 50 mg daily.  2. Lisinopril 10 mg daily.  3. Metformin 500 mg b.i.d.   SOCIAL HISTORY:  Lifelong never smoker.  He is retired from farming.  He  lives at home with his wife and has children.   FAMILY HISTORY:  Heart disease in a brother and sister.  Father had a  stroke.   REVIEW OF SYSTEMS:  Otherwise noncontributory.   PHYSICAL EXAMINATION:   GENERAL:  This is a well-developed, well-nourished,  middle-age male in no distress.  VITAL SIGNS:  Temperature 98, blood pressure 118/68, pulse 68, saturation  97% on room air.  CHEST:  Clear without evidence of adventitious breath sounds.  No wheeze,  rales, or rhonchi noted.  HEART:  Regular rate and rhythm without S3, normal S1 and S2.  ABDOMEN:  Soft, nontender.  EXTREMITIES:  No edema or clubbing.  SKIN:  Clear.  NEUROLOGY:  Intact.  HEENT:  No jugular venous distention and no lymphadenopathy.  Oropharynx is  clear.  NECK:  Supple.   Pulmonary functions were obtained and reviewed and revealed normal  spirometry with FEV1 of 120% predicted, FVC of 113% predicted, total  incapacity of 94% predicted, and diffusion capacity of 116% predicted.   CT scan shows a noncalcified 6 mm nodule left lower lobe.  No other  abnormalities seen.  IMPRESSION:  Enlarging left lower lobe noncalcified nodule that is worrisome  for malignancy until proven otherwise, but also may be benign.   RECOMMENDATIONS:  Obtain a PET scan and pursue bronchoscopy.  Also we will  likely pursue open biopsy of this lesion, but will await results of the  other studies.     Charlcie Cradle Delford Field, MD, [facility]  Electronically Signed    PEW/MedQ  DD: 05/06/2006  DT: 05/07/2006  Job #: [id number]   cc:   Patrica Duel, M.D.

## 2010-11-16 NOTE — Letter (Signed)
December 03, 2006    Patrica Duel, M.D.  [address]  [place], [place] [address]   RE:  William Fisher, William Fisher  MRN:  [id number]  /  DOB:  12/24/1931   Dear Dr. Nobie Putnam,   This is a follow-up note on William Fisher, who is the 77 year old male  found to have nodule in the left lower lung.  PET scan was  indeterminate.  Repeat CT scan done on December 02, 2006 shows no change in  left lower lobe nodule and right lower lobe nodules.  No significant  abnormalities on lymph nodes seen.  His fasting blood sugar on this  report date was 149.   We recommend a repeat CT in nine months, and we will arrange this.    Sincerely,      Charlcie Cradle. Delford Field, MD, [facility]  Electronically Signed    PEW/MedQ  DD: 12/03/2006  DT: 12/03/2006  Job #: [id number]

## 2010-11-16 NOTE — Consult Note (Signed)
NAMEKJUAN, William Fisher                            ACCOUNT NO.:  [id number]   MEDICAL RECORD NO.:  [id number]                  PATIENT TYPE:   LOCATION:                                       FACILITY:   PHYSICIAN:  Lionel December, M.D.                 DATE OF BIRTH:  [birth year]   DATE OF CONSULTATION:  12/28/2002  DATE OF DISCHARGE:                                   CONSULTATION   REFERRING PHYSICIAN:  Patrica Duel, M.D.   REASON FOR CONSULTATION:  Rectal bleeding, colonoscopy.   HISTORY OF PRESENT ILLNESS:  William Fisher is a 77 year old gentleman who has  had chronic intermittent bright red blood per rectum noted on the toilet  tissue.  His last colonoscopy was somewhere between 7-10 years ago.  He  denies any prior history of polyps.  No family history of colorectal cancer.  He has bowel movements daily.  Sometimes his stools get hard.  He treats his  rectal bleeding with Preparation H with good result.  He occasionally has  pelvic pain.  He notes it is primarily when he is having difficulty  initiating is urinary stream.  He denies any nausea or vomiting, heartburn,  dysphagia, or odynophagia.   CURRENT MEDICATIONS:  1. Atenolol 25 mg daily.  2. Ibuprofen p.r.n.  3. Aspirin 81 mg daily.  4. Multivitamin daily.   ALLERGIES:  PAIN MEDICATION, unsure name.   PAST MEDICAL HISTORY:  Irregular heartbeat, tonsillectomy, inguinal hernia  repair.   FAMILY HISTORY:  His mother had diabetes mellitus.  His father had a stroke.  A sister was killed in an auto accident in 7.  He has a brother who has  had a heart valve replacement complicated by a stroke.  Negative for  colorectal cancer.   SOCIAL HISTORY:  Married for 46 years.  He has four sons.  He is farmer.  He  denies any tobacco or alcohol use.   REVIEW OF SYSTEMS:  Please see HPI for GI.  GENERAL:  Denies any weight  loss.  CARDIOPULMONARY:  Denies any chest pain or shortness of breath.  Please see HPI for GU.   PHYSICAL  EXAMINATION:  VITAL SIGNS:  Weight 194, height 5 feet 9 inches,  temperature 97.7, blood pressure 120/82, pulse 68.  GENERAL:  A pleasant, well-nourished, well-developed, Caucasian male in no  acute distress.  SKIN:  Warm and dry, no jaundice.  HEENT:  Conjunctivae are pink, sclerae nonicteric.  Oropharyngeal mucosa  moist and pink, no lesions, erythema, or exudate.  No lymphadenopathy or  thyromegaly.  CHEST:  Lungs clear to auscultation.  CARDIAC:  Reveals regular rate and rhythm, normal S1, S2, no murmurs, rubs,  or gallops.  ABDOMEN:  Positive bowel sounds, soft, nontender, nondistended.  No  organomegaly or masses.  EXTREMITIES:  No edema.  RECTAL:  Deferred at time of colonoscopy.   IMPRESSION:  The patient is  a pleasant 77 year old gentleman with chronic  intermittent hematochezia felt to be due from a benign anorectal source.  Recommend colonoscopy at this time for further evaluation.   PLAN:  1. Colonoscopy in the near future.  2. The patient gives history of symptoms suggestive of benign prostatic     hypertrophy.  Recommend him follow up with Dr. Nobie Putnam regarding these     symptoms.     Tana Coast, P.A.                        Lionel December, M.D.    LL/MEDQ  D:  12/28/2002  T:  12/28/2002  Job:  [id number]

## 2010-11-16 NOTE — Op Note (Signed)
NAME:  William Fisher, William Fisher                ACCOUNT NO.:  [id number]   MEDICAL RECORD NO.:  [id number]          PATIENT TYPE:  AMB   LOCATION:  CARD                         FACILITY:  [facility]   PHYSICIAN:  Charlcie Cradle. Delford Field, MD, FCCPDATE OF BIRTH:  Feb 04, 1932   DATE OF PROCEDURE:  05/19/2006  DATE OF DISCHARGE:                                 OPERATIVE REPORT   PROCEDURE PERFORMED:  Bronchoscopy.   INDICATIONS FOR PROCEDURE:  Left lower lobe lung nodule, evaluate for cause.   OPERATOR:  Charlcie Cradle. Delford Field, MD   ANESTHESIA:  1% Xylocaine local.   PREOP MEDICATION:  Demerol 30 mg, Versed 3 mg IV push.   DESCRIPTION OF PROCEDURE:  The Pentax video bronchoscope was introduced via  the right naris.  The upper airways were visualized and were unremarkable.  The entire tracheobronchial tree was visualized and revealed no  endobronchial lesions.  Attention was paid to the left lower lobe orifice.  Transbronchial biopsies x 4 were obtained after the fourth biopsy.  A 50 mL  hemorrhage occurred.  This was controlled with topical epinephrine and  thrombin 1000 units per bronchoscope.   COMPLICATIONS:  Hemorrhage controlled with thrombin and epinephrine,  resolved at end of the procedure.   IMPRESSION:  Left lower lobe nodule, probable granuloma.   RECOMMENDATIONS:  Follow-up of pathology.      Charlcie Cradle Delford Field, MD, [facility]  Electronically Signed     PEW/MEDQ  D:  05/19/2006  T:  05/19/2006  Job:  [id number]

## 2011-05-12 IMAGING — CT CT HEAD W/O CM
1 series · 15 of 30 positions shown, 19 images · non-contrast
Comparison: 02/07/2010, 01/31/2010, 01/30/2010

CLINICAL DATA: History of herpes encephalitis, right temporal
region.  Irregular heart beat, nausea, hypertension, diabetes

CT HEAD WITHOUT CONTRAST
TECHNIQUE: Contiguous axial images were obtained from the base of
the skull through the vertex without contrast.

[Series 2: headseq 4.8 h37s · axial · 0.47mm/px · z∈[+136,+293]mm · 15 of 36 slices shown, 19 images]
[im 2/36  brain]
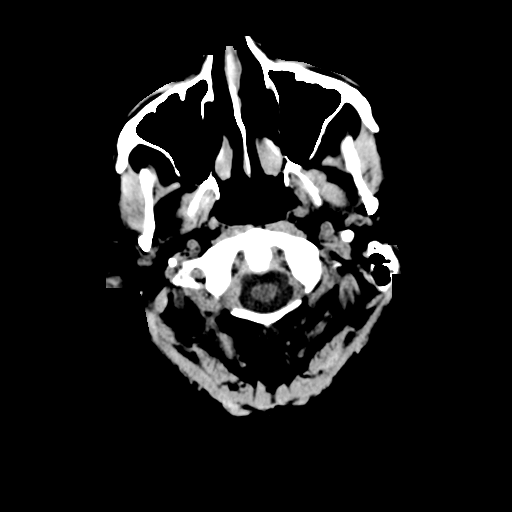
[im 2/36  bone]
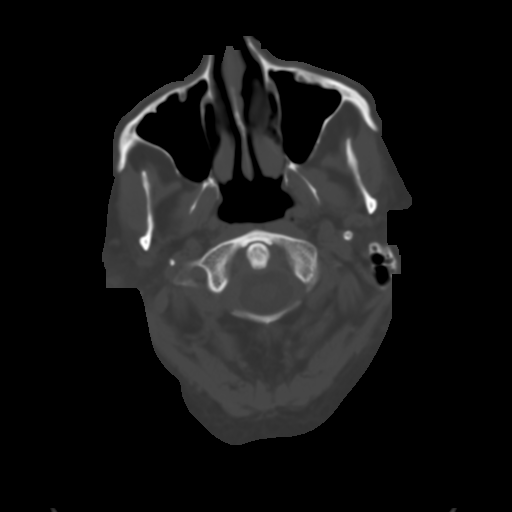
[im 4/36  brain]
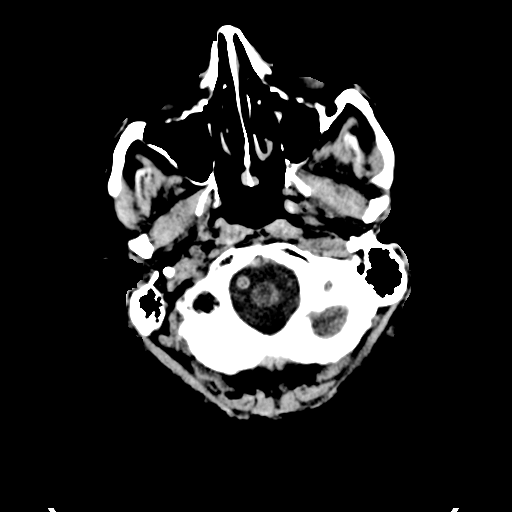
[im 7/36  brain]
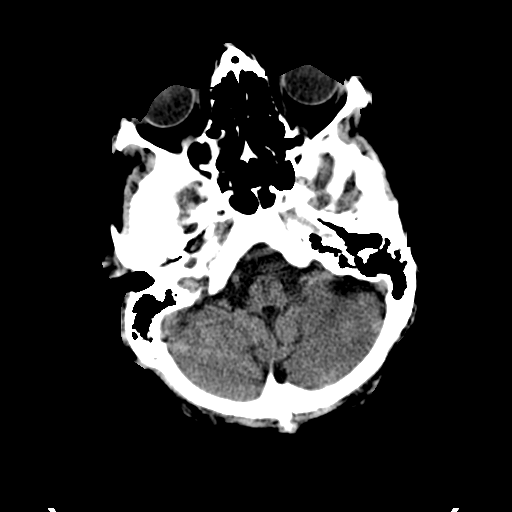
[im 9/36  brain]
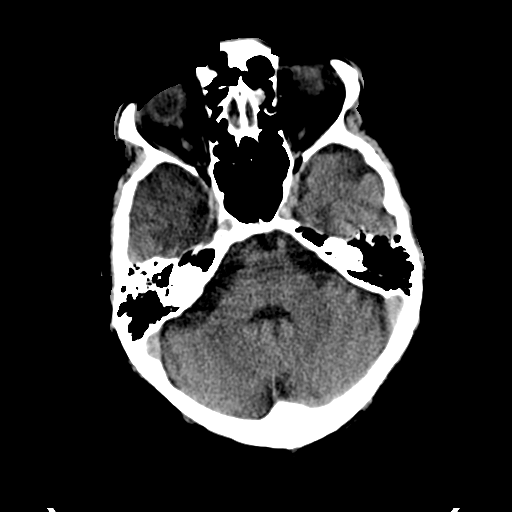
[im 11/36  brain]
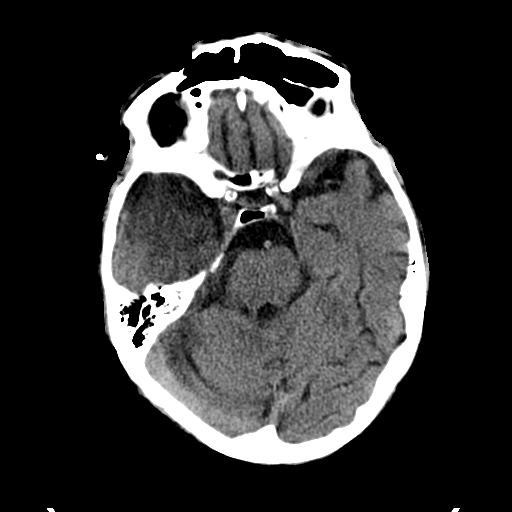
[im 11/36  bone]
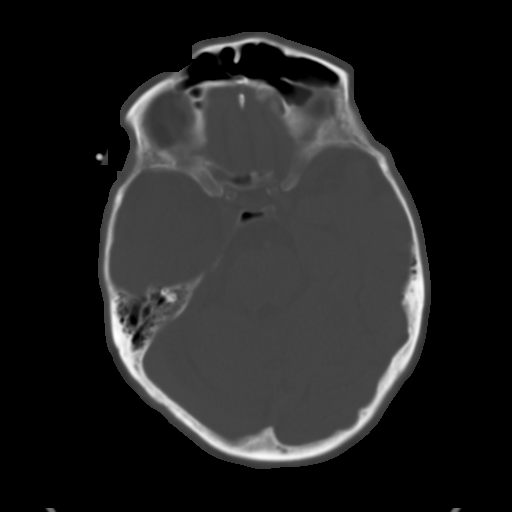
[im 14/36  brain]
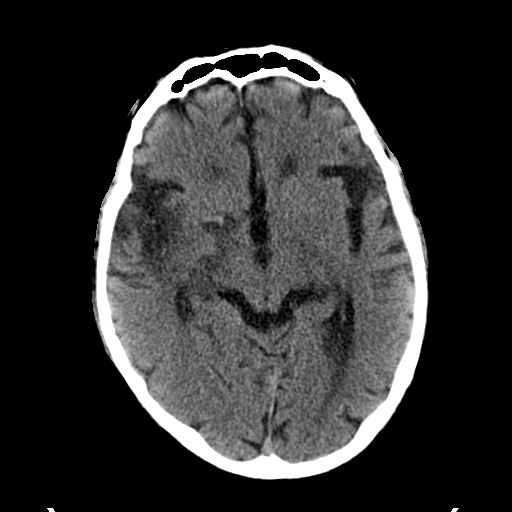
[im 16/36  brain]
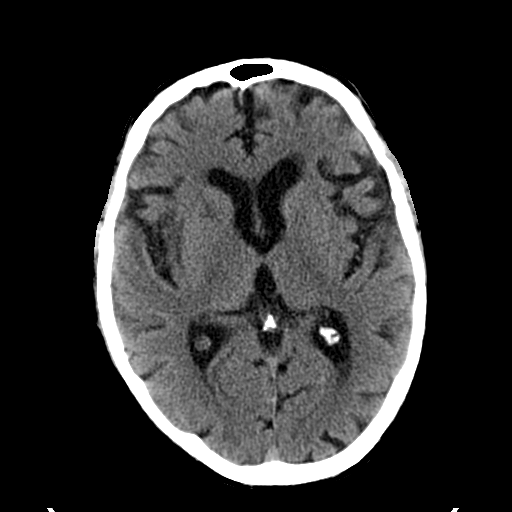
[im 19/36  brain]
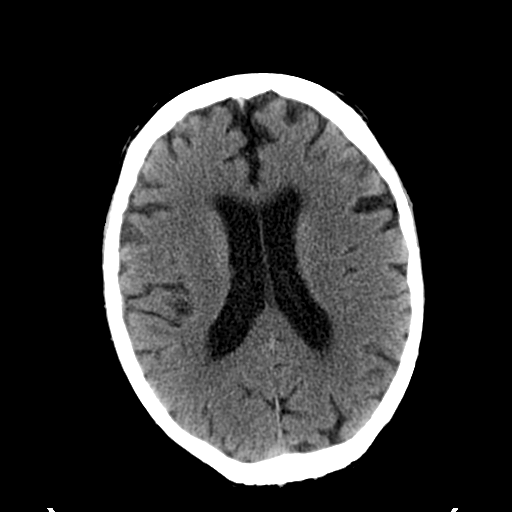
[im 20/36  brain]
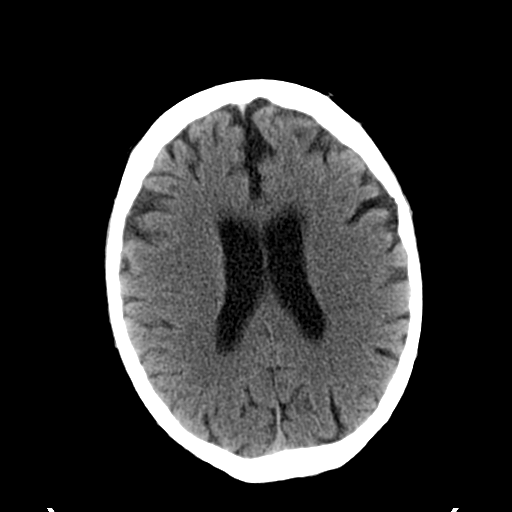
[im 20/36  bone]
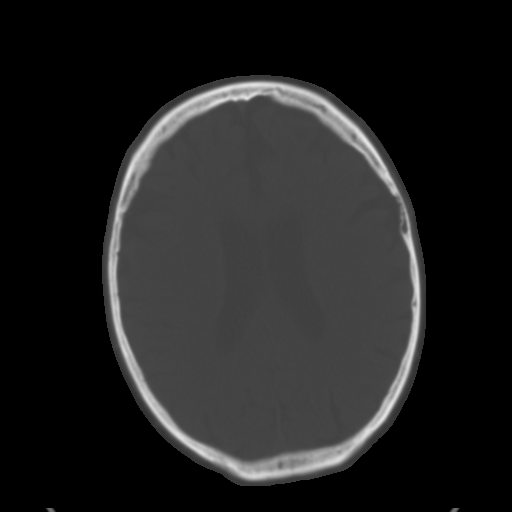
[im 22/36  brain]
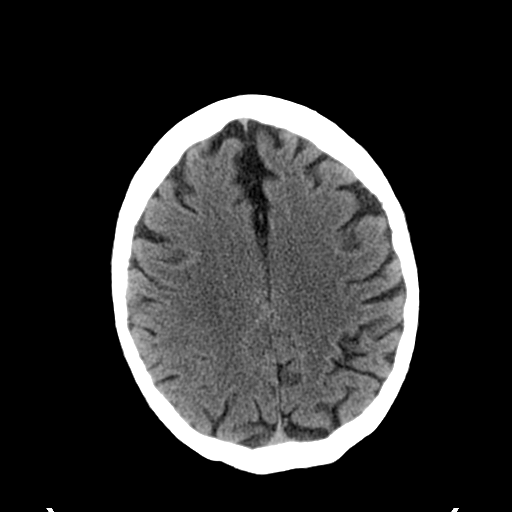
[im 25/36  brain]
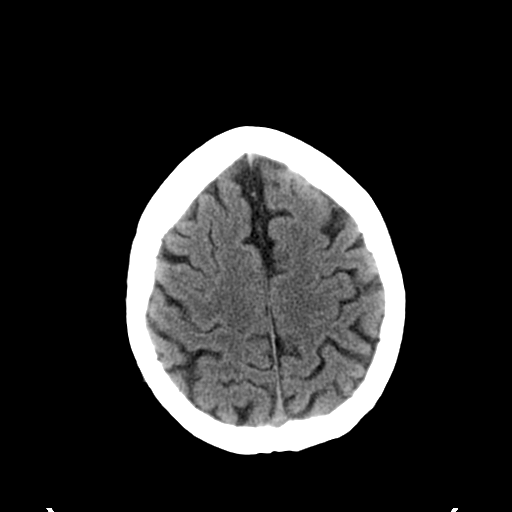
[im 27/36  brain]
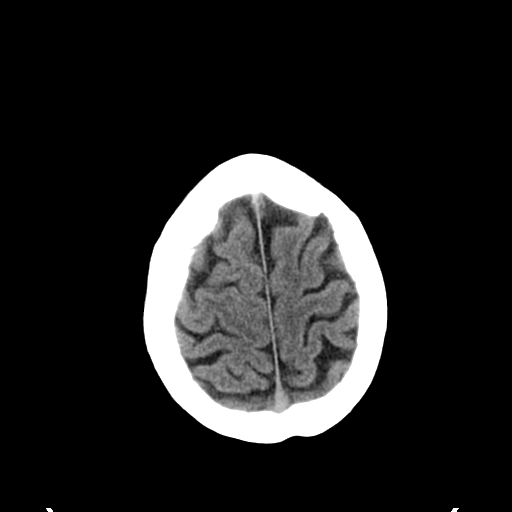
[im 29/36  brain]
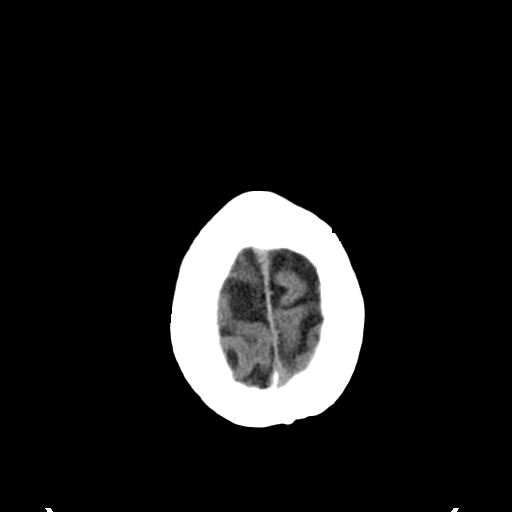
[im 29/36  bone]
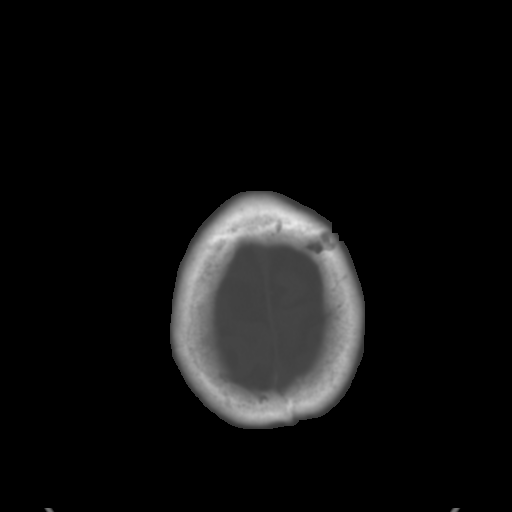
[im 32/36  brain]
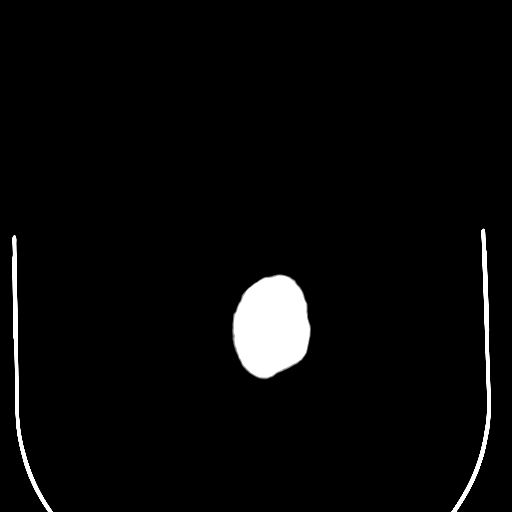
[im 34/36  brain]
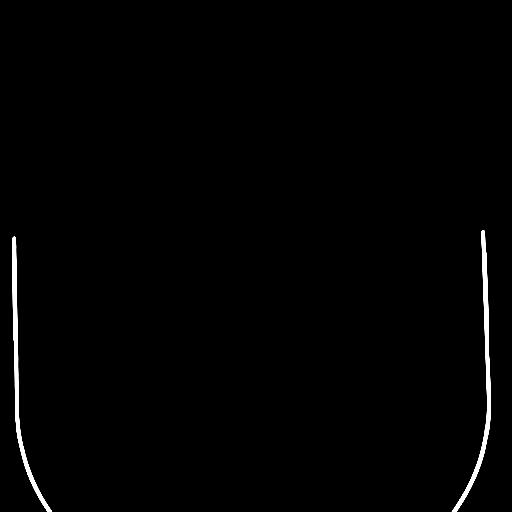

[15 of 30 positions shown; findings below may reference images not displayed]

FINDINGS: Interval improvement in the hypodense edema pattern
throughout the right temporal lobe, insular cortex and external
capsule.  Minimal hypoattenuation persist in this region.  No
significant mass effect.  No acute intracranial hemorrhage,
definite new infarction, mass lesion, midline shift, herniation,
hydrocephalus, or extra-axial fluid collection.  Diffuse brain
atrophy persist with periventricular microvascular ischemic
changes.  The cisterns are patent.  No cerebellar abnormality.
Atherosclerosis of the intracranial vessels at the skull base.
Mastoids and sinuses remain clear.  Small retention cyst or polyp
in the right maxillary sinus superiorly.
IMPRESSION: Improvement in right temporal hypodensity with less mass effect
consistent with resolving herpes encephalitis.

Atrophy and microvascular ischemic changes.

No current acute intracranial finding or hemorrhage.

## 2011-06-12 ENCOUNTER — Other Ambulatory Visit: Payer: Self-pay | Admitting: Neurology

## 2011-06-12 ENCOUNTER — Other Ambulatory Visit: Payer: Self-pay | Admitting: Respiratory Therapy

## 2011-06-12 DIAGNOSIS — B003 Herpesviral meningitis: Secondary | ICD-10-CM

## 2011-06-12 DIAGNOSIS — R519 Headache, unspecified: Secondary | ICD-10-CM

## 2011-06-12 DIAGNOSIS — F0390 Unspecified dementia without behavioral disturbance: Secondary | ICD-10-CM

## 2011-06-18 ENCOUNTER — Ambulatory Visit
Admission: RE | Admit: 2011-06-18 | Discharge: 2011-06-18 | Disposition: A | Discharge status: Home / Self Care | Payer: Medicare Other | Source: Ambulatory Visit | Attending: Neurology | Admitting: Neurology

## 2011-06-18 DIAGNOSIS — B003 Herpesviral meningitis: Secondary | ICD-10-CM

## 2011-06-18 DIAGNOSIS — R519 Headache, unspecified: Secondary | ICD-10-CM

## 2011-06-18 DIAGNOSIS — F0390 Unspecified dementia without behavioral disturbance: Secondary | ICD-10-CM

## 2011-06-18 MED ORDER — GADOBENATE DIMEGLUMINE 529 MG/ML IV SOLN
18.0000 mL | Freq: Once | INTRAVENOUS | Status: AC | PRN
  Administered 2011-06-18: 18 mL via INTRAVENOUS

## 2011-06-19 ENCOUNTER — Other Ambulatory Visit: Payer: Self-pay | Admitting: Neurology

## 2011-06-19 DIAGNOSIS — R519 Headache, unspecified: Secondary | ICD-10-CM

## 2011-07-01 ENCOUNTER — Ambulatory Visit
Admission: RE | Admit: 2011-07-01 | Discharge: 2011-07-01 | Disposition: A | Discharge status: Home / Self Care | Payer: Medicare Other | Source: Ambulatory Visit | Attending: Neurology | Admitting: Neurology

## 2011-07-01 DIAGNOSIS — R519 Headache, unspecified: Secondary | ICD-10-CM

## 2011-07-16 ENCOUNTER — Ambulatory Visit (HOSPITAL_COMMUNITY)
Admission: RE | Admit: 2011-07-16 | Discharge: 2011-07-16 | Disposition: A | Discharge status: Home / Self Care | Payer: Medicare Other | Source: Ambulatory Visit | Attending: Neurology | Admitting: Neurology

## 2011-07-16 DIAGNOSIS — E119 Type 2 diabetes mellitus without complications: Secondary | ICD-10-CM | POA: Insufficient documentation

## 2011-07-16 DIAGNOSIS — IMO0001 Reserved for inherently not codable concepts without codable children: Secondary | ICD-10-CM | POA: Insufficient documentation

## 2011-07-16 DIAGNOSIS — I10 Essential (primary) hypertension: Secondary | ICD-10-CM | POA: Insufficient documentation

## 2011-07-16 DIAGNOSIS — M542 Cervicalgia: Secondary | ICD-10-CM | POA: Insufficient documentation

## 2011-07-16 NOTE — Patient Instructions (Addendum)
HEP

## 2011-07-16 NOTE — Progress Notes (Signed)
Physical Therapy Evaluation  Patient Details  Name: William Fisher MRN: [id number] Date of Birth: Jul 01, 1932  Today's Date: 07/16/2011 Time: 1020-1100 Time Calculation (min): 40 min  Visit#: 1  of 8   Re-eval: 08/15/11 Assessment Diagnosis: cervical pain Prior Therapy: none  Past Medical History: No past medical history on file. Past Surgical History: No past surgical history on file.  Subjective Symptoms/Limitations Symptoms: Mr. Sellen states that he has been having pain on the right side of his neckl that runs up to his head.  The patient states that he normally is sore but the pain comes and goes.  He states that has the pain at least once a day.  The patient is being referred to PT to reduce his complaint of pain. How long can you sit comfortably?: no problem How long can you stand comfortably?: no problem How long can you walk comfortably?: no problem Special Tests: Pt states morning are worst and activity relieves. Pain Assessment Currently in Pain?: Yes Pain Score:   2 Pain Location: Neck Pain Orientation: Right Pain Type: Chronic pain Pain Onset: More than a month ago Pain Frequency: Intermittent Pain Relieving Factors: ibuprofen Effect of Pain on Daily Activities: decreases   Prior Functioning  Prior Function Vocation: Retired Leisure: Hobbies-yes (Comment) Comments: farms   Assessment Cervical AROM Cervical Flexion: wnl with reps causing no increase of symptoms Cervical Extension:  (wnl with reps causing no change.) Cervical - Right Side Bend:  (decreased 20% with reps causing slight increase of pain) Cervical - Left Side Bend:  (decreased 10% with reps causing to increase in pain) Cervical - Right Rotation: decreased 10% Cervical - Left Rotation: wnl Cervical Strength Cervical Flexion: 5/5 Cervical Extension: 5/5 Cervical - Right Side Bend: 5/5 Cervical - Left Side Bend: 5/5 Cervical - Right Rotation: 5/5 Cervical - Left Rotation:  5/5 Palpation Palpation: mm spasm mid trap area   Posture/Postural Control Posture/Postural Control: Postural limitations Postural Limitations: Pt has forward head and increased kyphosis   Stretches Shoulder Rolls:  (shoulders up/back relax for relaxation) Neck Exercises W Back: 10 reps Additional Neck Exercises    Manual Therapy Manual Therapy: Joint mobilization Joint Mobilization: manual traction Massage: to B cervical concentrating on R  Physical Therapy Assessment and Plan PT Assessment and Plan Clinical Impression Statement: Pt with mid trap mm spasm; decreased ROM and pain who will benefit from skilled PT to address above limitation and improve pt qualithy of life. Rehab Potential: Good PT Frequency: Min 2X/week PT Duration: 4 weeks PT Treatment/Interventions: Therapeutic exercise;Other (comment) (modalities as needed for pain.) PT Plan: Begin T-band exercises for posture; c-retraction and chest stretch as well as HMP with c-traction begining with 17 # pull max; min 12# x 12 min     Goals Home Exercise Program Pt will Perform Home Exercise Program: Independently PT Short Term Goals Time to Complete Short Term Goals: 2 weeks PT Short Term Goal 1: ROM wnl PT Short Term Goal 2: Pain level to be no greater than a 4 PT Long Term Goals Time to Complete Long Term Goals: 4 weeks PT Long Term Goal 1: Pt to be I in Advance HEP PT Long Term Goal 2: Pt pain to be no greater than a 2 Long Term Goal 3: Pt to only have pain twice a week  Problem List Patient Active Problem List  Diagnoses  . DM w/o Complication Type II  . HYPERTENSION  . PAROXYSMAL ATRIAL FIBRILLATION  . PULMONARY NODULE  . DIVERTICULITIS-COLON  PT - End of Session Activity Tolerance: Patient tolerated treatment well General Behavior During Session: [facility] for tasks performed Cognition: [facility] for tasks performed   Liban Guedes,CINDY 07/16/2011, 12:13 PM  Physician Documentation Your signature is  required to indicate approval of the treatment plan as stated above.  Please sign and either send electronically or make a copy of this report for your files and return this physician signed original.   Please mark one 1.__approve of plan  2. ___approve of plan with the following conditions.   ______________________________                                                          _____________________ Physician Signature                                                                                                             Date

## 2011-07-18 ENCOUNTER — Ambulatory Visit (HOSPITAL_COMMUNITY)
Admission: RE | Admit: 2011-07-18 | Discharge: 2011-07-18 | Disposition: A | Discharge status: Home / Self Care | Payer: Medicare Other | Source: Ambulatory Visit | Attending: Neurology | Admitting: Neurology

## 2011-07-18 NOTE — Progress Notes (Signed)
Physical Therapy Treatment Patient Details  Name: William Fisher MRN: [id number] Date of Birth: 01/19/1932  Today's Date: 07/18/2011 Time: 2130-8657 Time Calculation (min): 43 min Visit#: 2  of 8   Re-eval: 08/15/11 Charges: Therex x 26' Mech traction x 12'  Subjective: Symptoms/Limitations Symptoms: Pt reports that he felt a little better after he left last session. Pain Assessment Currently in Pain?: Yes Pain Score:   5 Pain Location: Neck Pain Orientation: Right   Exercise/Treatments Stretches Shoulder Rolls: up, back, relax x 10 Neck Exercises Neck Retraction: 10 reps Shoulder Extension: 10 reps;Theraband Theraband Level (Shoulder Extension): Level 3 (Green) Row: 10 reps;Theraband Theraband Level (Row): Level 3 (Green) Scapular Retraction: 10 reps;Theraband Theraband Level (Scapular Retraction): Level 3 (Green) W Back: 10 reps  Modalities Modalities: Traction Traction Type of Traction: Cervical Min (lbs): 12# Max (lbs): 17# Hold Time: 45 Rest Time: 15 Time: 12'  Physical Therapy Assessment and Plan PT Assessment and Plan Clinical Impression Statement: Pt requires frewquent VC's to slow down when completeing exercises. Pt also requires multimodal cueing for proper form with tband exercises. Began cervical mechanical traction this session to decrease cervical pain. Pt reports no change in pain at end of session. PT Plan: Continue to progress per PT POC. Assess reaction to traction.     Problem List Patient Active Problem List  Diagnoses  . DM w/o Complication Type II  . HYPERTENSION  . PAROXYSMAL ATRIAL FIBRILLATION  . PULMONARY NODULE  . DIVERTICULITIS-COLON    PT - End of Session Activity Tolerance: Patient tolerated treatment well General Behavior During Session: [facility] for tasks performed Cognition: [facility] for tasks performed  Antonieta Iba 07/18/2011, 10:23 AM

## 2011-07-22 ENCOUNTER — Ambulatory Visit (HOSPITAL_COMMUNITY): Payer: Medicare Other | Admitting: Physical Therapy

## 2011-07-22 ENCOUNTER — Telehealth (HOSPITAL_COMMUNITY): Payer: Self-pay

## 2011-07-23 ENCOUNTER — Ambulatory Visit (HOSPITAL_COMMUNITY): Payer: Medicare Other | Admitting: Physical Therapy

## 2011-07-24 ENCOUNTER — Ambulatory Visit (HOSPITAL_COMMUNITY): Payer: Medicare Other | Admitting: *Deleted

## 2011-07-25 ENCOUNTER — Ambulatory Visit (HOSPITAL_COMMUNITY): Payer: Medicare Other | Admitting: *Deleted

## 2011-07-29 ENCOUNTER — Ambulatory Visit (HOSPITAL_COMMUNITY)
Admission: RE | Admit: 2011-07-29 | Discharge: 2011-07-29 | Disposition: A | Discharge status: Home / Self Care | Payer: Medicare Other | Source: Ambulatory Visit | Attending: Neurology | Admitting: Neurology

## 2011-07-29 NOTE — Progress Notes (Signed)
Physical Therapy Treatment Patient Details  Name: William Fisher MRN: [id number] Date of Birth: [birth year]  Today's Date: 07/29/2011 Time: 1100-1138 Time Calculation (min): 38 min Visit#: 3  of 8   Re-eval: 08/15/11  there ex 17; HMP 1; c-traction   Subjective: Symptoms/Limitations Symptoms: I'm about the same.  I had to cancel last treatment due to my brother dying. Pain Assessment Currently in Pain?: Yes Pain Score:   5 Pain Location: Neck Pain Orientation: Right Pain Type: Chronic pain Pain Onset: More than a month ago Pain Frequency: Intermittent   Exercise/Treatments   Stretches Chest Stretch: 5 reps Shoulder Rolls: up, back, relax x 10 Neck Exercises Neck Retraction: 10 reps;Limitations Neck Retraction Limitations: against wall Neck Lateral Flexion - Right: 5 reps Neck Lateral Flexion - Left: 5 reps Shoulder Extension: 10 reps;Theraband Theraband Level (Shoulder Extension): Level 3 (Green) Row: 10 reps;Theraband Theraband Level (Row): Level 3 (Green) Scapular Retraction: 10 reps;Theraband Theraband Level (Scapular Retraction): Level 3 (Green) W Back: 10 reps Additional Neck Exercises Wall Pushups/Modified Pushups:  (10)  Modalities Modalities: Moist Heat;Traction Moist Heat Therapy Number Minutes Moist Heat: 15 Minutes Moist Heat Location:  (cervical) Traction Type of Traction: Cervical Min (lbs): 12 Max (lbs): 18 Hold Time: 60 Rest Time: 20 Time: 12  Physical Therapy Assessment and Plan PT Assessment and Plan Clinical Impression Statement: Pt continues to need verbal cuing for proper technique of exercises. Rehab Potential: Good PT Frequency: Min 2X/week PT Treatment/Interventions: Therapeutic exercise;Other (comment) (HMP with c-traction) PT Plan: begin prone chin tuckhead lift, w-back and row exercises may increase traction to 20#    Goals    Problem List Patient Active Problem List  Diagnoses  . DM w/o Complication Type II  . HYPERTENSION   . PAROXYSMAL ATRIAL FIBRILLATION  . PULMONARY NODULE  . DIVERTICULITIS-COLON    PT - End of Session Activity Tolerance: Patient tolerated treatment well General Behavior During Session: [facility] for tasks performed Cognition: [facility] for tasks performed PT Plan of Care Consulted and Agree with Plan of Care: Patient  RUSSELL,CINDY 07/29/2011, 11:36 AM

## 2011-07-30 ENCOUNTER — Ambulatory Visit (HOSPITAL_COMMUNITY): Payer: Medicare Other | Admitting: *Deleted

## 2011-07-31 ENCOUNTER — Ambulatory Visit (HOSPITAL_COMMUNITY)
Admission: RE | Admit: 2011-07-31 | Discharge: 2011-07-31 | Disposition: A | Discharge status: Home / Self Care | Payer: Medicare Other | Source: Ambulatory Visit | Attending: Internal Medicine | Admitting: Internal Medicine

## 2011-07-31 NOTE — Progress Notes (Signed)
Physical Therapy Treatment Patient Details  Name: William Fisher MRN: [id number] Date of Birth: [birth year]  Today's Date: 07/31/2011 Time: 1020-1104 Time Calculation (min): 44 min Visit#: 4  of 8   Re-eval: 08/15/11 Charges:  therex  20', Traction X 1 unit, MHP X 1 unit    Subjective: Symptoms/Limitations Symptoms: Pt. states his pain did not change after last visit; currently 5/10 pain;  Explained we would try increasing traction today (per PT POC) and see if it would decrease pain and if not, try something different.  (pt. has missed 1 week of appts due to death in family).   Pain Assessment Pain Score:   5 Pain Location: Neck   Exercise/Treatments Stretches Shoulder Rolls: 10 reps Neck Exercises Neck Retraction: 10 reps Neck Retraction Limitations: prone Neck Lateral Flexion - Right: 10 reps Neck Lateral Flexion - Left: 10 reps Shoulder Extension: 15 reps Theraband Level (Shoulder Extension): Level 3 (Green) Row: 15 reps;Theraband Theraband Level (Row): Level 3 (Green) Scapular Retraction: 15 reps;Theraband Theraband Level (Scapular Retraction): Level 3 (Green) W Back: Prone;10 reps Upper Extremity Lift: Limitations Uppder Extremity Lift Limitations: prone rows 10 reps Additional Neck Exercises UBE (Upper Arm Bike): 4' backward   Modalities Moist Heat Therapy Number Minutes Moist Heat: 15 Minutes Moist Heat Location:  (neck) Traction Type of Traction: Cervical Min (lbs): 15 Max (lbs): 20 Hold Time: 60 Rest Time: 15 Time: 15  Physical Therapy Assessment and Plan PT Assessment and Plan Clinical Impression Statement: Began prone exercises with good form; requires VC's for correct form with theraband exercises and will need continued reinforcement.  Pt given tband and written instructions for HEP.  Increased max hold to 20# today with traction to see if more effective in decreasing pain. PT Plan: Assess pain; if no better discuss changing modality with PT.      Problem List Patient Active Problem List  Diagnoses  . DM w/o Complication Type II  . HYPERTENSION  . PAROXYSMAL ATRIAL FIBRILLATION  . PULMONARY NODULE  . DIVERTICULITIS-COLON    PT - End of Session Activity Tolerance: Patient tolerated treatment well General Behavior During Session: [facility] for tasks performed Cognition: [facility] for tasks performed PT Plan of Care PT Home Exercise Plan: pt. given theraband and written instructions for HEP  Xiana Carns B. Bascom Levels, PTA 07/31/2011, 10:55 AM

## 2011-08-01 ENCOUNTER — Ambulatory Visit (HOSPITAL_COMMUNITY): Payer: Medicare Other | Admitting: Physical Therapy

## 2011-08-06 ENCOUNTER — Ambulatory Visit (HOSPITAL_COMMUNITY)
Admission: RE | Admit: 2011-08-06 | Discharge: 2011-08-06 | Disposition: A | Discharge status: Home / Self Care | Payer: Medicare Other | Source: Ambulatory Visit | Attending: Neurology | Admitting: Neurology

## 2011-08-06 DIAGNOSIS — M542 Cervicalgia: Secondary | ICD-10-CM | POA: Insufficient documentation

## 2011-08-06 DIAGNOSIS — I10 Essential (primary) hypertension: Secondary | ICD-10-CM | POA: Insufficient documentation

## 2011-08-06 DIAGNOSIS — IMO0001 Reserved for inherently not codable concepts without codable children: Secondary | ICD-10-CM | POA: Insufficient documentation

## 2011-08-06 DIAGNOSIS — E119 Type 2 diabetes mellitus without complications: Secondary | ICD-10-CM | POA: Insufficient documentation

## 2011-08-06 NOTE — Progress Notes (Signed)
Physical Therapy Treatment Patient Details  Name: William Fisher MRN: [id number] Date of Birth: [birth year]  Today's Date: 08/06/2011 Time: 2725-3664 Time Calculation (min): 43 min Visit#: 5  of 8   Re-eval: 08/15/11 Charges: Therex x 23' Manual x 15' MHP (w/manual x 10')  Subjective: Symptoms/Limitations Symptoms: My pain is the same. The traction hurts. Pain Assessment Currently in Pain?: Yes Pain Score:   5 Pain Location: Neck Pain Orientation: Right   Exercise/Treatments Stretches Shoulder Rolls: 10 reps Neck Exercises Neck Retraction: 10 reps Neck Retraction Limitations: prone Neck Lateral Flexion - Right: 10 reps Neck Lateral Flexion - Left: 10 reps Shoulder Extension: 15 reps Theraband Level (Shoulder Extension): Level 3 (Green) Row: 15 reps;Theraband Theraband Level (Row): Level 3 (Green) Scapular Retraction: 15 reps;Theraband Theraband Level (Scapular Retraction): Level 3 (Green) W Back: Prone;10 reps Upper Extremity Lift: Limitations Uppder Extremity Lift Limitations: prone rows 10 reps Additional Neck Exercises UBE (Upper Arm Bike): 4' backward  Modalities Modalities: Moist Heat Manual Therapy Manual Therapy: Other (comment) Other Manual Therapy: Manual cervical traction and STM to R SCM and lateral pterygoid to decrease pain x 15' Moist Heat Therapy Number Minutes Moist Heat: 15 Minutes Moist Heat Location: Other (comment) (cervical/upper back w/ manual)  Physical Therapy Assessment and Plan PT Assessment and Plan Clinical Impression Statement: Mechanical traction not completed today secondary to no decrease in pain. Began manual cervical traction and STM to R SCM and lateral pterygoid to decrease pain. Pt reports pain decrease to 0/10 at end of session. PT Plan: Continue to progress per PT POC. Assess lasting effects of manual next session.     Problem List Patient Active Problem List  Diagnoses  . DM w/o Complication Type II  . HYPERTENSION  .  PAROXYSMAL ATRIAL FIBRILLATION  . PULMONARY NODULE  . DIVERTICULITIS-COLON    PT - End of Session Activity Tolerance: Patient tolerated treatment well General Behavior During Session: [facility] for tasks performed Cognition: [name] for tasks performed  Antonieta Iba 08/06/2011, 11:57 AM

## 2011-08-08 ENCOUNTER — Ambulatory Visit (HOSPITAL_COMMUNITY)
Admission: RE | Admit: 2011-08-08 | Discharge: 2011-08-08 | Disposition: A | Discharge status: Home / Self Care | Payer: Medicare Other | Source: Ambulatory Visit | Attending: Internal Medicine | Admitting: Internal Medicine

## 2011-08-08 ENCOUNTER — Encounter: Payer: Self-pay | Admitting: Cardiovascular Disease

## 2011-08-08 NOTE — Progress Notes (Addendum)
Physical Therapy Treatment Patient Details  Name: EL PILE MRN: [id number] Date of Birth: Dec 08, 1931  Today's Date: 08/08/2011 Time: 4098-1191 Time Calculation (min): 30 min Visit#: 6  of 8   Re-eval: 08/15/11 Charges:  ROMM x 1 Therex 15' Pt education (self care) x 8'  Subjective: Symptoms/Limitations Symptoms: I feel terrible. I don't think these exercises are helping. Pain Assessment Currently in Pain?: Yes Pain Score:   6 Pain Location: Neck Pain Orientation: Right  Objective:  08/08/11 1122  Cervical AROM  Cervical Flexion WNL  Cervical Extension WNL  Cervical - Right Side Bend decreased 10%  Cervical - Left Side Bend decreased 10%  Cervical - Right Rotation WNL     Exercise/Treatments  Stretches Upper Trapezius Stretch: 3 reps;30 seconds Shoulder Rolls: 10 reps Neck Exercises Neck Retraction: 10 reps Shoulder Extension: 15 reps Theraband Level (Shoulder Extension): Level 3 (Green) Row: 15 reps;Theraband Theraband Level (Row): Level 3 (Green) Scapular Retraction: 15 reps;Theraband Theraband Level (Scapular Retraction): Level 3 (Green) W Back: 10 reps;Seated Additional Neck Exercises UBE (Upper Arm Bike): 4' backward  Physical Therapy Assessment and Plan PT Assessment and Plan Clinical Impression Statement: Pt displays improvements in AROM but continues to be limited with B side bend. Pt has not seen any change in his pain since beginning therapy. Pt reports that he wishes to complete his exercises at home. Pt educated on the importance of proper form with exercises and completing them everyday. PT Plan: D/C per pt request.    Goals Home Exercise Program Pt will Perform Home Exercise Program: Independently PT Short Term Goals Time to Complete Short Term Goals: 2 weeks PT Short Term Goal 1: ROM wnl PT Short Term Goal 1 - Progress: Partly met PT Short Term Goal 2: Pain level to be no greater than a 4 PT Short Term Goal 2 - Progress: Not met PT Long  Term Goals Time to Complete Long Term Goals: 4 weeks PT Long Term Goal 1: Pt to be I in Advance HEP PT Long Term Goal 1 - Progress: Partly met PT Long Term Goal 2: Pt pain to be no greater than a 2 PT Long Term Goal 2 - Progress: Not met Long Term Goal 3: Pt to only have pain twice a week Long Term Goal 3 Progress: Not met  Problem List Patient Active Problem List  Diagnoses  . DM w/o Complication Type II  . HYPERTENSION  . PAROXYSMAL ATRIAL FIBRILLATION  . PULMONARY NODULE  . DIVERTICULITIS-COLON    PT - End of Session Activity Tolerance: Patient tolerated treatment well General Behavior During Session: [facility] for tasks performed Cognition: [facility] for tasks performed  Antonieta Iba / Lurena Joiner B. Demarrion Meiklejohn, PT, DPT  08/08/2011, 11:50 AM

## 2011-08-12 ENCOUNTER — Ambulatory Visit (HOSPITAL_COMMUNITY): Payer: Medicare Other | Admitting: Physical Therapy

## 2011-08-14 ENCOUNTER — Ambulatory Visit (HOSPITAL_COMMUNITY): Payer: Medicare Other | Admitting: *Deleted

## 2011-12-18 ENCOUNTER — Ambulatory Visit (INDEPENDENT_AMBULATORY_CARE_PROVIDER_SITE_OTHER): Payer: Medicare Other | Admitting: Cardiovascular Disease

## 2011-12-18 ENCOUNTER — Encounter: Payer: Self-pay | Admitting: Cardiovascular Disease

## 2011-12-18 ENCOUNTER — Encounter: Payer: Self-pay | Admitting: *Deleted

## 2011-12-18 VITALS — BP 116/72 | HR 89 | Resp 16 | Ht 69.0 in | Wt 183.0 lb

## 2011-12-18 DIAGNOSIS — I10 Essential (primary) hypertension: Secondary | ICD-10-CM

## 2011-12-18 DIAGNOSIS — I4891 Unspecified atrial fibrillation: Secondary | ICD-10-CM

## 2011-12-18 DIAGNOSIS — E119 Type 2 diabetes mellitus without complications: Secondary | ICD-10-CM

## 2011-12-18 MED ORDER — ATENOLOL 25 MG PO TABS: 25.0000 mg | ORAL_TABLET | Freq: Every day | ORAL | Status: DC

## 2011-12-18 NOTE — Assessment & Plan Note (Signed)
Well controlled.  Continue current medications and low sodium Dash type diet.    

## 2011-12-18 NOTE — Progress Notes (Signed)
Patient ID: William Fisher, male   DOB: 01-24-1932, 78 y.o.   MRN: [id number] 78 yo referred by Dr [name] for F/U   Perviously seen by [facility].  History of atrial arrythmias and ? Very isolated PAF.  Was on coumadin until 2011 when her got encephalitis and was stopped.  No CAD with normal cath in 2009.  He recovered remarkably well from encephalitis with just residual headaches that he sees [name] for.  Continues to have palpitations and relative tachycardia.  On Atenolol in past.  Has NIDDM with some dietary indiscretion.  Elevated chol Rx with statin in past.  No chest pain syncope or dyspnea.  Compliant with meds  Memory is failing a bit.  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    Reviewed numerous records from [facility] time 20 minutes.  Last seen by [name] 5/12    ROS: Denies fever, malais, weight loss, blurry vision, decreased visual acuity, cough, sputum, SOB, hemoptysis, pleuritic pain, palpitaitons, heartburn, abdominal pain, melena, lower extremity edema, claudication, or rash.  All other systems reviewed and negative   General: Affect appropriate Healthy:  appears stated age HEENT: normal Neck supple with no adenopathy JVP normal no bruits no thyromegaly Lungs clear with no wheezing and good diaphragmatic motion Heart:  S1/S2 no murmur,rub, gallop or click PMI normal Abdomen: benighn, BS positve, no tenderness, no AAA no bruit.  No HSM or HJR Distal pulses intact with no bruits No edema Neuro non-focal Skin warm and dry No muscular weakness  Medications Current Outpatient Prescriptions  Medication Sig Dispense Refill  . aspirin 81 MG tablet Take 81 mg by mouth daily.      Marland Kitchen glipiZIDE (GLUCOTROL XL) 5 MG 24 hr tablet Take 5 mg by mouth daily.      Marland Kitchen ibuprofen (ADVIL,MOTRIN) 800 MG tablet Take 800 mg by mouth every 8 (eight) hours as needed.      . metFORMIN (GLUCOPHAGE) 500 MG tablet Take 500 mg by mouth 2 (two) times daily with a meal.      . Multiple Vitamin (MULTIVITAMIN) capsule Take  1 capsule by mouth daily.      Marland Kitchen atenolol (TENORMIN) 25 MG tablet Take 1 tablet (25 mg total) by mouth daily.  30 tablet  6    Allergies Codeine and Guaifenesin  Family History: No family history on file.  Social History: History   Social History  . Marital Status: Married    Spouse Name: N/A    Number of Children: N/A  . Years of Education: N/A   Occupational History  . Not on file.   Social History Main Topics  . Smoking status: Never Smoker   . Smokeless tobacco: Not on file  . Alcohol Use: No  . Drug Use: No  . Sexually Active: Not on file   Other Topics Concern  . Not on file   Social History Narrative  . No narrative on file    Electrocardiogram:  NSR rate 91 PVC no ischemic changes  Assessment and Plan

## 2011-12-18 NOTE — Assessment & Plan Note (Signed)
Discussed low carb diet.  Target hemoglobin A1c is 6.5 or less.  Continue current medications.  

## 2011-12-18 NOTE — Patient Instructions (Signed)
**Note De-Identified  Obfuscation** Your physician has recommended you make the following change in your medication: start taking Atenolol 25 mg daily  Your physician recommends that you schedule a follow-up appointment in: 6 months

## 2011-12-18 NOTE — Assessment & Plan Note (Signed)
Maint NSR with age and history of encephalitis do not think coumadin needs to be resumed.  Restart atenolol 25 mg as he does have relative tachycardia and palpitations.

## 2011-12-19 NOTE — Addendum Note (Signed)
Addended by: Marrion Coy L on: 12/19/2011 04:26 PM   Modules accepted: Orders

## 2011-12-24 ENCOUNTER — Encounter: Payer: Self-pay | Admitting: Cardiovascular Disease

## 2012-07-23 ENCOUNTER — Ambulatory Visit (INDEPENDENT_AMBULATORY_CARE_PROVIDER_SITE_OTHER): Payer: Medicare Other | Admitting: Cardiovascular Disease

## 2012-07-23 ENCOUNTER — Encounter: Payer: Self-pay | Admitting: Cardiovascular Disease

## 2012-07-23 VITALS — BP 124/80 | HR 87 | Ht 68.5 in | Wt 181.0 lb

## 2012-07-23 DIAGNOSIS — I10 Essential (primary) hypertension: Secondary | ICD-10-CM

## 2012-07-23 DIAGNOSIS — I4891 Unspecified atrial fibrillation: Secondary | ICD-10-CM

## 2012-07-23 DIAGNOSIS — E119 Type 2 diabetes mellitus without complications: Secondary | ICD-10-CM

## 2012-07-23 NOTE — Assessment & Plan Note (Signed)
Poorly controlled Compliant with meds.  F/u Dr Margo Aye Discussed issues with dietary indiscretion

## 2012-07-23 NOTE — Assessment & Plan Note (Signed)
Well controlled.  Continue current medications and low sodium Dash type diet.    

## 2012-07-23 NOTE — Assessment & Plan Note (Signed)
Maint NSR  Encouraged him to take Atenolol daily  ASA 81 mg

## 2012-07-23 NOTE — Patient Instructions (Addendum)
Your physician recommends that you schedule a follow-up appointment in: 6 MONTHS 

## 2012-07-23 NOTE — Progress Notes (Signed)
Patient ID: William Fisher, male   DOB: [birth year], 79 y.o.   MRN: [id number] 79 yo referred by William Fisher for F/U Perviously seen by [facility]. History of atrial arrythmias and ? Very isolated PAF. Was on coumadin until 2011 when her got encephalitis and was stopped. No CAD with normal cath in 2009. He recovered remarkably well from encephalitis with just residual headaches that he sees William Fisher for. Continues to have palpitations and relative tachycardia. On Atenolol in past. Has NIDDM with some dietary indiscretion. Elevated chol Rx with statin in past. No chest pain syncope or dyspnea. Compliant with meds Memory is failing a bit. 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  Reviewed numerous records from [facility] time 20 minutes. Last seen by William Fisher 5/12   Not taking atenolol daily just as needed.  Some palpitations but nothing too bothersome.  Reviewed lab work from William Fisher done 1/20  LDL 98  Kidneys and liver normal. PSA normal BS too high with A1c 8.3  Discussed low carb diet with him  Grieving over loss of wife in June 28, 2023 Married 55 years She died of cancer ant [employer]   ROS: Denies fever, malais, weight loss, blurry vision, decreased visual acuity, cough, sputum, SOB, hemoptysis, pleuritic pain, palpitaitons, heartburn, abdominal pain, melena, lower extremity edema, claudication, or rash.  All other systems reviewed and negative  General: Affect appropriate Healthy:  appears stated age HEENT: normal Neck supple with no adenopathy JVP normal no bruits no thyromegaly Lungs clear with no wheezing and good diaphragmatic motion Heart:  S1/S2 no murmur, no rub, gallop or click PMI normal Abdomen: benighn, BS positve, no tenderness, no AAA no bruit.  No HSM or HJR Distal pulses intact with no bruits No edema Neuro non-focal Skin warm and dry No muscular weakness   Current Outpatient Prescriptions  Medication Sig Dispense Refill  . atenolol (TENORMIN) 25 MG tablet Take 25-50 mg by mouth as needed.      Marland Kitchen  ibuprofen (ADVIL,MOTRIN) 800 MG tablet Take 800 mg by mouth every 8 (eight) hours as needed.      . metFORMIN (GLUCOPHAGE) 500 MG tablet Take 500 mg by mouth 2 (two) times daily with a meal.      . Multiple Vitamin (MULTIVITAMIN) capsule Take 1 capsule by mouth daily.        Allergies  Codeine and Guaifenesin  Electrocardiogram:  6/19  SR rate 91  PVC  Assessment and Plan

## 2012-10-21 ENCOUNTER — Encounter: Payer: Self-pay | Admitting: Cardiovascular Disease

## 2012-11-09 ENCOUNTER — Encounter: Payer: Self-pay | Admitting: Cardiovascular Disease

## 2012-11-09 ENCOUNTER — Ambulatory Visit (INDEPENDENT_AMBULATORY_CARE_PROVIDER_SITE_OTHER): Payer: Medicare Other | Admitting: Cardiovascular Disease

## 2012-11-09 VITALS — BP 120/82 | HR 90 | Ht 70.0 in | Wt 186.4 lb

## 2012-11-09 DIAGNOSIS — I10 Essential (primary) hypertension: Secondary | ICD-10-CM

## 2012-11-09 DIAGNOSIS — E119 Type 2 diabetes mellitus without complications: Secondary | ICD-10-CM

## 2012-11-09 DIAGNOSIS — I4891 Unspecified atrial fibrillation: Secondary | ICD-10-CM

## 2012-11-09 MED ORDER — ATENOLOL 25 MG PO TABS: 25.0000 mg | ORAL_TABLET | ORAL | Status: DC | PRN

## 2012-11-09 NOTE — Assessment & Plan Note (Signed)
Maint NSR with no real palpitaitons Refill for Atenolol called in

## 2012-11-09 NOTE — Assessment & Plan Note (Signed)
Meds adjusted by Dr Margo Aye Discussed low carb diet, and exercise. Agree with Dr Margo Aye if he doesn't start doing better with his diet he will need insulin

## 2012-11-09 NOTE — Assessment & Plan Note (Signed)
Well controlled.  Continue current medications and low sodium Dash type diet.    

## 2012-11-09 NOTE — Patient Instructions (Signed)
Your physician wants you to follow-up in: 1 year with Dr. Nishan You will receive a reminder letter in the mail two months in advance. If you don't receive a letter, please call our office to schedule the follow-up appointment.  Your physician recommends that you continue on your current medications as directed. Please refer to the Current Medication list given to you today.  

## 2012-11-09 NOTE — Progress Notes (Signed)
Patient ID: NUH LIPTON, male   DOB: 05/22/1932, 79 y.o.   MRN: [id number] 79 yo referred by Dr [name] for F/U Perviously seen by [facility]. History of atrial arrythmias and ? Very isolated PAF. Was on coumadin until 2011 when her got encephalitis and was stopped. No CAD with normal cath in 2009. He recovered remarkably well from encephalitis with just residual headaches that he sees [name] for. Continues to have palpitations and relative tachycardia. On Atenolol in past. Has NIDDM with some dietary indiscretion. Elevated chol Rx with statin in past. No chest pain syncope or dyspnea. Compliant with meds Memory is failing a bit. 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  Reviewed numerous records from [facility] time 20 minutes. Last seen by [name] 5/12   Labs from primary [facility] reviewed LDL 98  AIC 8..3   ROS: Denies fever, malais, weight loss, blurry vision, decreased visual acuity, cough, sputum, SOB, hemoptysis, pleuritic pain, palpitaitons, heartburn, abdominal pain, melena, lower extremity edema, claudication, or rash.  All other systems reviewed and negative  General: Affect appropriate Healthy:  appears stated age HEENT: normal Neck supple with no adenopathy JVP normal no bruits no thyromegaly Lungs clear with no wheezing and good diaphragmatic motion Heart:  S1/S2 no murmur, no rub, gallop or click PMI normal Abdomen: benighn, BS positve, no tenderness, no AAA no bruit.  No HSM or HJR Distal pulses intact with no bruits No edema Neuro non-focal Skin warm and dry No muscular weakness   Current Outpatient Prescriptions  Medication Sig Dispense Refill  . atenolol (TENORMIN) 25 MG tablet Take 25-50 mg by mouth as needed.      . Dapagliflozin Propanediol (FARXIGA) 5 MG TABS Take by mouth daily.      Marland Kitchen ibuprofen (ADVIL,MOTRIN) 800 MG tablet Take 800 mg by mouth every 8 (eight) hours as needed.      . Multiple Vitamin (MULTIVITAMIN) capsule Take 1 capsule by mouth daily.      . NON FORMULARY  cebria fo memory      . Saxagliptin-Metformin (KOMBIGLYZE XR) 10-998 MG TB24 Take by mouth daily.       No current facility-administered medications for this visit.    Allergies  Codeine and Guaifenesin  Electrocardiogram: SR rate 92 normal ECG  Assessment and Plan

## 2012-12-21 ENCOUNTER — Encounter: Payer: Self-pay | Admitting: Internal Medicine

## 2013-02-02 ENCOUNTER — Ambulatory Visit: Payer: Medicare Other | Admitting: Cardiovascular Disease

## 2013-07-28 ENCOUNTER — Encounter: Payer: Self-pay | Admitting: Internal Medicine

## 2013-10-17 ENCOUNTER — Other Ambulatory Visit: Payer: Self-pay | Admitting: Cardiovascular Disease

## 2013-12-13 ENCOUNTER — Other Ambulatory Visit: Payer: Self-pay | Admitting: Cardiovascular Disease

## 2013-12-13 NOTE — Telephone Encounter (Signed)
Needed 1 yr fu 5/15,  made apt for 6/25 at 9:15am with Dr.[place]isahn in [place].LM giving pt this information.one month refill given until after yearly fu complete

## 2013-12-23 ENCOUNTER — Encounter: Payer: Self-pay | Admitting: Cardiovascular Disease

## 2013-12-23 ENCOUNTER — Ambulatory Visit (INDEPENDENT_AMBULATORY_CARE_PROVIDER_SITE_OTHER): Payer: Medicare Other | Admitting: Cardiovascular Disease

## 2013-12-23 VITALS — BP 110/76 | HR 75 | Ht 70.0 in | Wt 183.0 lb

## 2013-12-23 DIAGNOSIS — I480 Paroxysmal atrial fibrillation: Secondary | ICD-10-CM

## 2013-12-23 DIAGNOSIS — I10 Essential (primary) hypertension: Secondary | ICD-10-CM

## 2013-12-23 DIAGNOSIS — I4891 Unspecified atrial fibrillation: Secondary | ICD-10-CM

## 2013-12-23 DIAGNOSIS — E119 Type 2 diabetes mellitus without complications: Secondary | ICD-10-CM

## 2013-12-23 NOTE — Patient Instructions (Signed)
Your physician wants you to follow-up in: 1 year in [place] You will receive a reminder letter in the mail two months in advance. If you don't receive a letter, please call our office to schedule the follow-up appointment.      Your physician recommends that you continue on your current medications as directed. Please refer to the Current Medication list given to you today.      Thank you for choosing [facility] !

## 2013-12-23 NOTE — Assessment & Plan Note (Signed)
Well controlled.  Continue current medications and low sodium Dash type diet.   Consider adding ACE for renal protection Will defer to primary

## 2013-12-23 NOTE — Assessment & Plan Note (Signed)
Discussed low carb diet.  Target hemoglobin A1c is 6.5 or less.  Continue current medications.  

## 2013-12-23 NOTE — Assessment & Plan Note (Signed)
No palpitations  maint NSR  ECG normal ASA

## 2013-12-23 NOTE — Progress Notes (Signed)
Patient ID: William Fisher, male   DOB: 02/25/1932, 80 y.o.   MRN: [id number] 80 yo referred by William Fisher for F/U Perviously seen by William Fisher. History of atrial arrythmias and ? Very isolated PAF. Was on coumadin until 2011 when her got encephalitis and was stopped. No CAD with normal cath in 2009. He recovered remarkably well from encephalitis with just residual headaches that he sees [place] AbtsJim Fisher for. Continues to have palpitations and relative tachycardia. On Atenolol in past. Has NIDDM with some dietary indiscretion. Elevated chol Rx with statin in past. No chest pain syncope or dyspnea. Compliant with meds Memory is failing a bit. 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  Reviewed numerous records from William Fisher time 20 minutes. Last seen by William Fisher 5/12  [facility] LDL 98 AIC 8..3    Lonely as wife died 1.5 years ago.  Biggest issue has been his DM.  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  All other systems reviewed and negative  General: Affect appropriate Healthy:  appears stated age HEENT: normal Neck supple with no adenopathy JVP normal no bruits no thyromegaly Lungs clear with no wheezing and good diaphragmatic motion Heart:  S1/S2 no murmur, no rub, gallop or click PMI normal Abdomen: benighn, BS positve, no tenderness, no AAA no bruit.  No HSM or HJR Distal pulses intact with no bruits No edema Neuro non-focal Skin warm and dry Some moles on his back that don't look malignant No muscular weakness   Current Outpatient Prescriptions  Medication Sig Dispense Refill  . aspirin EC 81 MG tablet Take 81 mg by mouth daily.      Marland Kitchen. atenolol (TENORMIN) 25 MG tablet  TAKE 1- 2 TABLETS BY MOUTH EVERY DAY AS NEEDED  30 tablet  0  . glipiZIDE (GLUCOTROL XL) 2.5 MG 24 hr tablet Take 2.5 mg by mouth daily with breakfast.       . ibuprofen (ADVIL,MOTRIN) 800 MG tablet Take 800 mg by mouth every 8 (eight) hours as needed.      . Multiple Vitamin (MULTIVITAMIN) capsule Take 1 capsule by mouth daily.      . Saxagliptin-Metformin (KOMBIGLYZE XR) 10-998 MG TB24 Take by mouth daily.       No current facility-administered medications for this visit.    Allergies  Codeine and Guaifenesin  Electrocardiogram:  SR PAC;s  No acute ischemic changes    2014  NSR normal ECG today rate 75  Assessment and Plan

## 2014-02-07 ENCOUNTER — Other Ambulatory Visit: Payer: Self-pay | Admitting: Cardiovascular Disease

## 2014-07-13 ENCOUNTER — Encounter: Payer: Self-pay | Admitting: Neurology

## 2014-07-13 ENCOUNTER — Ambulatory Visit (INDEPENDENT_AMBULATORY_CARE_PROVIDER_SITE_OTHER): Payer: Medicare Other | Admitting: Neurology

## 2014-07-13 VITALS — BP 161/85 | HR 89 | Ht 69.0 in | Wt 178.0 lb

## 2014-07-13 DIAGNOSIS — R413 Other amnesia: Secondary | ICD-10-CM

## 2014-07-13 DIAGNOSIS — R5381 Other malaise: Secondary | ICD-10-CM

## 2014-07-13 DIAGNOSIS — E538 Deficiency of other specified B group vitamins: Secondary | ICD-10-CM

## 2014-07-13 DIAGNOSIS — R5383 Other fatigue: Secondary | ICD-10-CM

## 2014-07-13 HISTORY — DX: Other amnesia: R41.3

## 2014-07-13 NOTE — Progress Notes (Signed)
Reason for visit: Change in behavior  William Fisher is a 81 y.o. 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 The patient had involvement of the right temporal area, and he was treated with acyclovir at the time. The patient comes to the office today with his son. The son indicates that since the episode of herpes encephalitis, the patient has been a bit more docile, and he has been more susceptible to financial scams. His wife died 2 years ago, and since he has been living alone, this has become more of an issue. The patient has not had any definite change in memory. He has not had intermittent episodes of confusion or blackout episodes. He has not had any problems with numbness or weakness of the face, arms, or legs. He reports no problems with balance or issues with falling. He has not having problems controlling the bowels or the bladder. The patient operates a motor vehicle without difficulty. He does his own finances and pays his bills on time. He occasionally will misplace something about the house, but not frequently. Occasionally he may have troubles remembering the words for something or the name for someone. This is not a big issue for him. The patient has been seen previously for headaches, this is no longer an issue. He is sent to this office for further evaluation. The son believes that the change in behavior has been a static issue since the encephalitis.  Past Medical History  Diagnosis Date  . Hyperlipidemia   . Herpes encephalitis   . Diabetes mellitus   . Palpitations   . Dementia   . Coronary artery disease   . Orthostatic hypotension   . SOB (shortness of breath)   . Paroxysmal atrial fibrillation   . Memory change 07/13/2014  . HOH (hard of hearing)     hearing aides    Past Surgical History  Procedure Laterality Date  . Tonsillectomy    . Hernia repair       Family History  Problem Relation Age of Onset  . Stroke Father   . Heart disease Brother   . Stroke Brother     Social history:  reports that he has never smoked. He has never used smokeless tobacco. He reports that he does not drink alcohol or use illicit drugs.  Medications:  Current Outpatient Prescriptions on File Prior to Visit  Medication Sig Dispense Refill  . atenolol (TENORMIN) 25 MG tablet TAKE 1- 2 TABLETS BY MOUTH EVERY DAY AS NEEDED 30 tablet 11  . ibuprofen (ADVIL,MOTRIN) 800 MG tablet Take 800 mg by mouth every 8 (eight) hours as needed.    . Multiple Vitamin (MULTIVITAMIN) capsule Take 1 capsule by mouth daily.     No current facility-administered medications on file prior to visit.      Allergies  Allergen Reactions  . Codeine   . Guaifenesin     ROS:  Out of a complete 14 system review of symptoms, the patient complains only of the following symptoms, and all other reviewed systems are negative.  Hearing loss Moles Joint pain  Blood pressure 161/85, pulse 89, height  (1.753 m), weight 178 lb (80.74 kg).  Physical Exam  General: The patient is alert and cooperative at the time of the examination.  Eyes: Pupils are equal, round, and reactive to light. Discs are flat bilaterally.  Neck: The neck is  supple, no carotid bruits are noted.  Respiratory: The respiratory examination is clear.  Cardiovascular: The cardiovascular examination reveals a regular rate and rhythm, no obvious murmurs or rubs are noted.  Skin: Extremities are without significant edema.  Neurologic Exam  Mental status: The Mini-Mental Status Examination done today shows a total score of 26/30.  Cranial nerves: Facial symmetry is present. There is good sensation of the face to pinprick and soft touch bilaterally. The strength of the facial muscles and the muscles to head turning and shoulder shrug are normal bilaterally. Speech is well enunciated, no aphasia or dysarthria  is noted. Extraocular movements are full. Visual fields are full. The tongue is midline, and the patient has symmetric elevation of the soft palate. No obvious hearing deficits are noted.  Motor: The motor testing reveals 5 over 5 strength of all 4 extremities. Good symmetric motor tone is noted throughout.  Sensory: Sensory testing is intact to pinprick, soft touch, vibration sensation, and position sense on all 4 extremities. No evidence of extinction is noted.  Coordination: Cerebellar testing reveals good finger-nose-finger and heel-to-shin bilaterally.  Gait and station: Gait is normal. Tandem gait is normal. Romberg is negative. No drift is seen.  Reflexes: Deep tendon reflexes are symmetric and normal bilaterally. Toes are downgoing bilaterally.   MRI brain 06/18/11:  Impression: Abnormal MRI brain (with and without contrast) demonstrating: 1. Right anterior temporal cystic encephalomalacia and gliosis from prior herpes encephalitis. 2. In comparison from prior MRI on 06/21/10, there has been resolution of right temporal enhancement.    Assessment/Plan:  1. History of herpes encephalitis, right temporal involvement  2. Mild static encephalopathy  The patient may have mild static encephalopathy following the herpes encephalitis. The family does not believe that the patient has had a progressive memory issue. We will check blood work today, and we will follow the memory over time, if some progression is noted, he will be started on medications for memory. The patient appears to be functioning relatively well day-to-day.  Marlan Palau. Keith Willis MD 07/14/2014 7:56 AM  [facility] [address] [place], [place] [id number]  Phone [phone] Fax [phone]

## 2014-07-14 LAB — SPECIMEN STATUS REPORT

## 2014-07-16 ENCOUNTER — Telehealth: Payer: Self-pay | Admitting: Neurology

## 2014-07-16 LAB — VITAMIN B12: VITAMIN B 12: 276 pg/mL (ref 211–946)

## 2014-07-16 LAB — COPPER, SERUM: Copper: 97 ug/dL (ref 72–166)

## 2014-07-16 LAB — TSH: TSH: 4.85 u[IU]/mL — ABNORMAL HIGH (ref 0.450–4.500)

## 2014-07-16 NOTE — Telephone Encounter (Signed)
I called the patient. The blood work shows minimal elevation of TSH. I will send the blood work to the primary MD. I discussed this with the patient.

## 2014-07-28 ENCOUNTER — Telehealth: Payer: Self-pay | Admitting: *Deleted

## 2014-07-28 DIAGNOSIS — R413 Other amnesia: Secondary | ICD-10-CM

## 2014-07-28 NOTE — Telephone Encounter (Signed)
I called the son. I discussed the blood work results with him. The patient has had significant issues with poor judgment, he has been involved with several phone scams, and he has spent a lot of money in this regard. The family is concerned that he will become financially destitute. I will set up formal neuropsychological evaluation this get a better picture of his executive functioning. The patient does have mild memory disorder. The family may try to get a court order to determine that this patient is not competent to manage his financial affairs.

## 2015-03-14 ENCOUNTER — Encounter: Payer: Self-pay | Admitting: Internal Medicine

## 2015-03-14 ENCOUNTER — Encounter: Payer: Self-pay | Admitting: Cardiovascular Disease

## 2015-03-14 ENCOUNTER — Ambulatory Visit: Payer: Medicare Other | Admitting: Neurology

## 2015-07-13 ENCOUNTER — Other Ambulatory Visit: Payer: Self-pay | Admitting: Cardiovascular Disease

## 2015-10-23 DIAGNOSIS — E782 Mixed hyperlipidemia: Secondary | ICD-10-CM | POA: Diagnosis not present

## 2015-10-23 DIAGNOSIS — E119 Type 2 diabetes mellitus without complications: Secondary | ICD-10-CM | POA: Diagnosis not present

## 2015-10-23 DIAGNOSIS — D519 Vitamin B12 deficiency anemia, unspecified: Secondary | ICD-10-CM | POA: Diagnosis not present

## 2015-10-25 DIAGNOSIS — D519 Vitamin B12 deficiency anemia, unspecified: Secondary | ICD-10-CM | POA: Diagnosis not present

## 2015-10-25 DIAGNOSIS — G3184 Mild cognitive impairment, so stated: Secondary | ICD-10-CM | POA: Diagnosis not present

## 2015-10-25 DIAGNOSIS — I10 Essential (primary) hypertension: Secondary | ICD-10-CM | POA: Diagnosis not present

## 2015-10-25 DIAGNOSIS — E782 Mixed hyperlipidemia: Secondary | ICD-10-CM | POA: Diagnosis not present

## 2016-01-24 DIAGNOSIS — D519 Vitamin B12 deficiency anemia, unspecified: Secondary | ICD-10-CM | POA: Diagnosis not present

## 2016-01-24 DIAGNOSIS — E559 Vitamin D deficiency, unspecified: Secondary | ICD-10-CM | POA: Diagnosis not present

## 2016-01-24 DIAGNOSIS — E782 Mixed hyperlipidemia: Secondary | ICD-10-CM | POA: Diagnosis not present

## 2016-01-24 DIAGNOSIS — E119 Type 2 diabetes mellitus without complications: Secondary | ICD-10-CM | POA: Diagnosis not present

## 2016-01-26 DIAGNOSIS — D519 Vitamin B12 deficiency anemia, unspecified: Secondary | ICD-10-CM | POA: Diagnosis not present

## 2016-01-26 DIAGNOSIS — G3184 Mild cognitive impairment, so stated: Secondary | ICD-10-CM | POA: Diagnosis not present

## 2016-01-26 DIAGNOSIS — E1165 Type 2 diabetes mellitus with hyperglycemia: Secondary | ICD-10-CM | POA: Diagnosis not present

## 2016-01-26 DIAGNOSIS — E782 Mixed hyperlipidemia: Secondary | ICD-10-CM | POA: Diagnosis not present

## 2016-01-26 DIAGNOSIS — I10 Essential (primary) hypertension: Secondary | ICD-10-CM | POA: Diagnosis not present

## 2016-05-01 DIAGNOSIS — E119 Type 2 diabetes mellitus without complications: Secondary | ICD-10-CM | POA: Diagnosis not present

## 2016-05-01 DIAGNOSIS — E782 Mixed hyperlipidemia: Secondary | ICD-10-CM | POA: Diagnosis not present

## 2016-05-01 DIAGNOSIS — D519 Vitamin B12 deficiency anemia, unspecified: Secondary | ICD-10-CM | POA: Diagnosis not present

## 2016-05-03 DIAGNOSIS — D519 Vitamin B12 deficiency anemia, unspecified: Secondary | ICD-10-CM | POA: Diagnosis not present

## 2016-05-03 DIAGNOSIS — E782 Mixed hyperlipidemia: Secondary | ICD-10-CM | POA: Diagnosis not present

## 2016-05-03 DIAGNOSIS — G3184 Mild cognitive impairment, so stated: Secondary | ICD-10-CM | POA: Diagnosis not present

## 2016-05-03 DIAGNOSIS — E119 Type 2 diabetes mellitus without complications: Secondary | ICD-10-CM | POA: Diagnosis not present

## 2016-05-03 DIAGNOSIS — I10 Essential (primary) hypertension: Secondary | ICD-10-CM | POA: Diagnosis not present

## 2016-08-02 DIAGNOSIS — E119 Type 2 diabetes mellitus without complications: Secondary | ICD-10-CM | POA: Diagnosis not present

## 2016-08-02 DIAGNOSIS — I10 Essential (primary) hypertension: Secondary | ICD-10-CM | POA: Diagnosis not present

## 2016-08-02 DIAGNOSIS — E782 Mixed hyperlipidemia: Secondary | ICD-10-CM | POA: Diagnosis not present

## 2016-08-06 DIAGNOSIS — I10 Essential (primary) hypertension: Secondary | ICD-10-CM | POA: Diagnosis not present

## 2016-08-06 DIAGNOSIS — G3184 Mild cognitive impairment, so stated: Secondary | ICD-10-CM | POA: Diagnosis not present

## 2016-08-06 DIAGNOSIS — D519 Vitamin B12 deficiency anemia, unspecified: Secondary | ICD-10-CM | POA: Diagnosis not present

## 2016-08-06 DIAGNOSIS — E1165 Type 2 diabetes mellitus with hyperglycemia: Secondary | ICD-10-CM | POA: Diagnosis not present

## 2016-12-03 DIAGNOSIS — Z125 Encounter for screening for malignant neoplasm of prostate: Secondary | ICD-10-CM | POA: Diagnosis not present

## 2016-12-03 DIAGNOSIS — E1165 Type 2 diabetes mellitus with hyperglycemia: Secondary | ICD-10-CM | POA: Diagnosis not present

## 2016-12-03 DIAGNOSIS — D519 Vitamin B12 deficiency anemia, unspecified: Secondary | ICD-10-CM | POA: Diagnosis not present

## 2017-03-10 DIAGNOSIS — E1165 Type 2 diabetes mellitus with hyperglycemia: Secondary | ICD-10-CM | POA: Diagnosis not present

## 2017-03-10 DIAGNOSIS — D519 Vitamin B12 deficiency anemia, unspecified: Secondary | ICD-10-CM | POA: Diagnosis not present

## 2017-03-10 DIAGNOSIS — Z23 Encounter for immunization: Secondary | ICD-10-CM | POA: Diagnosis not present

## 2017-03-10 DIAGNOSIS — I10 Essential (primary) hypertension: Secondary | ICD-10-CM | POA: Diagnosis not present

## 2017-03-10 DIAGNOSIS — G3184 Mild cognitive impairment, so stated: Secondary | ICD-10-CM | POA: Diagnosis not present

## 2017-03-11 DIAGNOSIS — H903 Sensorineural hearing loss, bilateral: Secondary | ICD-10-CM | POA: Diagnosis not present

## 2017-04-14 DIAGNOSIS — E1165 Type 2 diabetes mellitus with hyperglycemia: Secondary | ICD-10-CM | POA: Diagnosis not present

## 2017-04-14 DIAGNOSIS — I10 Essential (primary) hypertension: Secondary | ICD-10-CM | POA: Diagnosis not present

## 2017-04-16 DIAGNOSIS — G3184 Mild cognitive impairment, so stated: Secondary | ICD-10-CM | POA: Diagnosis not present

## 2017-04-16 DIAGNOSIS — D519 Vitamin B12 deficiency anemia, unspecified: Secondary | ICD-10-CM | POA: Diagnosis not present

## 2017-04-16 DIAGNOSIS — E1165 Type 2 diabetes mellitus with hyperglycemia: Secondary | ICD-10-CM | POA: Diagnosis not present

## 2017-04-16 DIAGNOSIS — I10 Essential (primary) hypertension: Secondary | ICD-10-CM | POA: Diagnosis not present

## 2017-08-07 DIAGNOSIS — I10 Essential (primary) hypertension: Secondary | ICD-10-CM | POA: Diagnosis not present

## 2017-08-07 DIAGNOSIS — E1165 Type 2 diabetes mellitus with hyperglycemia: Secondary | ICD-10-CM | POA: Diagnosis not present

## 2017-08-07 DIAGNOSIS — Z125 Encounter for screening for malignant neoplasm of prostate: Secondary | ICD-10-CM | POA: Diagnosis not present

## 2017-08-07 DIAGNOSIS — E782 Mixed hyperlipidemia: Secondary | ICD-10-CM | POA: Diagnosis not present

## 2017-08-11 DIAGNOSIS — D519 Vitamin B12 deficiency anemia, unspecified: Secondary | ICD-10-CM | POA: Diagnosis not present

## 2017-08-11 DIAGNOSIS — I10 Essential (primary) hypertension: Secondary | ICD-10-CM | POA: Diagnosis not present

## 2017-08-11 DIAGNOSIS — G3184 Mild cognitive impairment, so stated: Secondary | ICD-10-CM | POA: Diagnosis not present

## 2017-08-11 DIAGNOSIS — E782 Mixed hyperlipidemia: Secondary | ICD-10-CM | POA: Diagnosis not present

## 2017-11-10 DIAGNOSIS — E1165 Type 2 diabetes mellitus with hyperglycemia: Secondary | ICD-10-CM | POA: Diagnosis not present

## 2017-11-10 DIAGNOSIS — E782 Mixed hyperlipidemia: Secondary | ICD-10-CM | POA: Diagnosis not present

## 2017-11-10 DIAGNOSIS — D519 Vitamin B12 deficiency anemia, unspecified: Secondary | ICD-10-CM | POA: Diagnosis not present

## 2017-11-26 DIAGNOSIS — E1165 Type 2 diabetes mellitus with hyperglycemia: Secondary | ICD-10-CM | POA: Diagnosis not present

## 2017-11-26 DIAGNOSIS — I10 Essential (primary) hypertension: Secondary | ICD-10-CM | POA: Diagnosis not present

## 2017-11-26 DIAGNOSIS — G3184 Mild cognitive impairment, so stated: Secondary | ICD-10-CM | POA: Diagnosis not present

## 2017-11-26 DIAGNOSIS — E782 Mixed hyperlipidemia: Secondary | ICD-10-CM | POA: Diagnosis not present

## 2017-12-12 DIAGNOSIS — H11121 Conjunctival concretions, right eye: Secondary | ICD-10-CM | POA: Diagnosis not present

## 2018-01-12 DIAGNOSIS — H16223 Keratoconjunctivitis sicca, not specified as Sjogren's, bilateral: Secondary | ICD-10-CM | POA: Diagnosis not present

## 2018-01-12 DIAGNOSIS — H524 Presbyopia: Secondary | ICD-10-CM | POA: Diagnosis not present

## 2018-02-27 DIAGNOSIS — E1165 Type 2 diabetes mellitus with hyperglycemia: Secondary | ICD-10-CM | POA: Diagnosis not present

## 2018-02-27 DIAGNOSIS — E782 Mixed hyperlipidemia: Secondary | ICD-10-CM | POA: Diagnosis not present

## 2018-02-27 DIAGNOSIS — I10 Essential (primary) hypertension: Secondary | ICD-10-CM | POA: Diagnosis not present

## 2018-03-04 DIAGNOSIS — G3184 Mild cognitive impairment, so stated: Secondary | ICD-10-CM | POA: Diagnosis not present

## 2018-03-04 DIAGNOSIS — E782 Mixed hyperlipidemia: Secondary | ICD-10-CM | POA: Diagnosis not present

## 2018-03-04 DIAGNOSIS — D519 Vitamin B12 deficiency anemia, unspecified: Secondary | ICD-10-CM | POA: Diagnosis not present

## 2018-03-04 DIAGNOSIS — E1165 Type 2 diabetes mellitus with hyperglycemia: Secondary | ICD-10-CM | POA: Diagnosis not present

## 2018-05-14 DIAGNOSIS — Z23 Encounter for immunization: Secondary | ICD-10-CM | POA: Diagnosis not present

## 2018-07-08 DIAGNOSIS — E1165 Type 2 diabetes mellitus with hyperglycemia: Secondary | ICD-10-CM | POA: Diagnosis not present

## 2018-07-08 DIAGNOSIS — D519 Vitamin B12 deficiency anemia, unspecified: Secondary | ICD-10-CM | POA: Diagnosis not present

## 2018-07-08 DIAGNOSIS — I10 Essential (primary) hypertension: Secondary | ICD-10-CM | POA: Diagnosis not present

## 2018-07-08 DIAGNOSIS — E782 Mixed hyperlipidemia: Secondary | ICD-10-CM | POA: Diagnosis not present

## 2018-07-13 DIAGNOSIS — E782 Mixed hyperlipidemia: Secondary | ICD-10-CM | POA: Diagnosis not present

## 2018-07-13 DIAGNOSIS — D519 Vitamin B12 deficiency anemia, unspecified: Secondary | ICD-10-CM | POA: Diagnosis not present

## 2018-07-13 DIAGNOSIS — E1165 Type 2 diabetes mellitus with hyperglycemia: Secondary | ICD-10-CM | POA: Diagnosis not present

## 2018-07-13 DIAGNOSIS — I10 Essential (primary) hypertension: Secondary | ICD-10-CM | POA: Diagnosis not present

## 2018-07-21 DIAGNOSIS — H5203 Hypermetropia, bilateral: Secondary | ICD-10-CM | POA: Diagnosis not present

## 2018-10-26 DIAGNOSIS — Z0000 Encounter for general adult medical examination without abnormal findings: Secondary | ICD-10-CM | POA: Diagnosis not present

## 2018-11-18 DIAGNOSIS — E1165 Type 2 diabetes mellitus with hyperglycemia: Secondary | ICD-10-CM | POA: Diagnosis not present

## 2018-11-18 DIAGNOSIS — D519 Vitamin B12 deficiency anemia, unspecified: Secondary | ICD-10-CM | POA: Diagnosis not present

## 2018-11-18 DIAGNOSIS — I10 Essential (primary) hypertension: Secondary | ICD-10-CM | POA: Diagnosis not present

## 2018-11-18 DIAGNOSIS — E782 Mixed hyperlipidemia: Secondary | ICD-10-CM | POA: Diagnosis not present

## 2018-11-24 DIAGNOSIS — G3184 Mild cognitive impairment, so stated: Secondary | ICD-10-CM | POA: Diagnosis not present

## 2018-11-24 DIAGNOSIS — I10 Essential (primary) hypertension: Secondary | ICD-10-CM | POA: Diagnosis not present

## 2018-11-24 DIAGNOSIS — E782 Mixed hyperlipidemia: Secondary | ICD-10-CM | POA: Diagnosis not present

## 2018-11-24 DIAGNOSIS — E1165 Type 2 diabetes mellitus with hyperglycemia: Secondary | ICD-10-CM | POA: Diagnosis not present

## 2019-04-05 DIAGNOSIS — E1165 Type 2 diabetes mellitus with hyperglycemia: Secondary | ICD-10-CM | POA: Diagnosis not present

## 2019-04-05 DIAGNOSIS — E782 Mixed hyperlipidemia: Secondary | ICD-10-CM | POA: Diagnosis not present

## 2019-04-05 DIAGNOSIS — I10 Essential (primary) hypertension: Secondary | ICD-10-CM | POA: Diagnosis not present

## 2019-04-05 DIAGNOSIS — D519 Vitamin B12 deficiency anemia, unspecified: Secondary | ICD-10-CM | POA: Diagnosis not present

## 2019-04-08 DIAGNOSIS — I10 Essential (primary) hypertension: Secondary | ICD-10-CM | POA: Diagnosis not present

## 2019-04-08 DIAGNOSIS — E782 Mixed hyperlipidemia: Secondary | ICD-10-CM | POA: Diagnosis not present

## 2019-04-08 DIAGNOSIS — Z23 Encounter for immunization: Secondary | ICD-10-CM | POA: Diagnosis not present

## 2019-04-08 DIAGNOSIS — E1165 Type 2 diabetes mellitus with hyperglycemia: Secondary | ICD-10-CM | POA: Diagnosis not present

## 2019-04-19 DIAGNOSIS — E1165 Type 2 diabetes mellitus with hyperglycemia: Secondary | ICD-10-CM | POA: Diagnosis not present

## 2019-04-19 DIAGNOSIS — I10 Essential (primary) hypertension: Secondary | ICD-10-CM | POA: Diagnosis not present

## 2019-04-19 DIAGNOSIS — G3184 Mild cognitive impairment, so stated: Secondary | ICD-10-CM | POA: Diagnosis not present

## 2019-04-19 DIAGNOSIS — E782 Mixed hyperlipidemia: Secondary | ICD-10-CM | POA: Diagnosis not present

## 2019-06-02 DIAGNOSIS — E782 Mixed hyperlipidemia: Secondary | ICD-10-CM | POA: Diagnosis not present

## 2019-06-02 DIAGNOSIS — E1165 Type 2 diabetes mellitus with hyperglycemia: Secondary | ICD-10-CM | POA: Diagnosis not present

## 2019-06-02 DIAGNOSIS — I10 Essential (primary) hypertension: Secondary | ICD-10-CM | POA: Diagnosis not present

## 2019-06-02 DIAGNOSIS — G3184 Mild cognitive impairment, so stated: Secondary | ICD-10-CM | POA: Diagnosis not present

## 2019-07-22 DIAGNOSIS — E1165 Type 2 diabetes mellitus with hyperglycemia: Secondary | ICD-10-CM | POA: Diagnosis not present

## 2019-07-22 DIAGNOSIS — I10 Essential (primary) hypertension: Secondary | ICD-10-CM | POA: Diagnosis not present

## 2019-07-22 DIAGNOSIS — E7849 Other hyperlipidemia: Secondary | ICD-10-CM | POA: Diagnosis not present

## 2019-07-22 DIAGNOSIS — G3184 Mild cognitive impairment, so stated: Secondary | ICD-10-CM | POA: Diagnosis not present

## 2019-08-12 DIAGNOSIS — E782 Mixed hyperlipidemia: Secondary | ICD-10-CM | POA: Diagnosis not present

## 2019-08-12 DIAGNOSIS — E1165 Type 2 diabetes mellitus with hyperglycemia: Secondary | ICD-10-CM | POA: Diagnosis not present

## 2019-08-12 DIAGNOSIS — D519 Vitamin B12 deficiency anemia, unspecified: Secondary | ICD-10-CM | POA: Diagnosis not present

## 2019-08-12 DIAGNOSIS — G3184 Mild cognitive impairment, so stated: Secondary | ICD-10-CM | POA: Diagnosis not present

## 2019-08-17 DIAGNOSIS — I10 Essential (primary) hypertension: Secondary | ICD-10-CM | POA: Diagnosis not present

## 2019-08-17 DIAGNOSIS — G3184 Mild cognitive impairment, so stated: Secondary | ICD-10-CM | POA: Diagnosis not present

## 2019-08-17 DIAGNOSIS — E1165 Type 2 diabetes mellitus with hyperglycemia: Secondary | ICD-10-CM | POA: Diagnosis not present

## 2019-08-17 DIAGNOSIS — E782 Mixed hyperlipidemia: Secondary | ICD-10-CM | POA: Diagnosis not present

## 2019-10-22 DIAGNOSIS — I10 Essential (primary) hypertension: Secondary | ICD-10-CM | POA: Diagnosis not present

## 2019-10-22 DIAGNOSIS — E782 Mixed hyperlipidemia: Secondary | ICD-10-CM | POA: Diagnosis not present

## 2019-10-22 DIAGNOSIS — E1165 Type 2 diabetes mellitus with hyperglycemia: Secondary | ICD-10-CM | POA: Diagnosis not present

## 2019-10-22 DIAGNOSIS — D519 Vitamin B12 deficiency anemia, unspecified: Secondary | ICD-10-CM | POA: Diagnosis not present

## 2019-11-25 DIAGNOSIS — E1165 Type 2 diabetes mellitus with hyperglycemia: Secondary | ICD-10-CM | POA: Diagnosis not present

## 2019-11-25 DIAGNOSIS — D509 Iron deficiency anemia, unspecified: Secondary | ICD-10-CM | POA: Diagnosis not present

## 2019-11-25 DIAGNOSIS — D519 Vitamin B12 deficiency anemia, unspecified: Secondary | ICD-10-CM | POA: Diagnosis not present

## 2019-11-25 DIAGNOSIS — D649 Anemia, unspecified: Secondary | ICD-10-CM | POA: Diagnosis not present

## 2019-11-30 DIAGNOSIS — E1165 Type 2 diabetes mellitus with hyperglycemia: Secondary | ICD-10-CM | POA: Diagnosis not present

## 2019-11-30 DIAGNOSIS — I10 Essential (primary) hypertension: Secondary | ICD-10-CM | POA: Diagnosis not present

## 2019-11-30 DIAGNOSIS — G3184 Mild cognitive impairment, so stated: Secondary | ICD-10-CM | POA: Diagnosis not present

## 2019-11-30 DIAGNOSIS — E782 Mixed hyperlipidemia: Secondary | ICD-10-CM | POA: Diagnosis not present

## 2020-01-26 DIAGNOSIS — E1165 Type 2 diabetes mellitus with hyperglycemia: Secondary | ICD-10-CM | POA: Diagnosis not present

## 2020-01-26 DIAGNOSIS — I10 Essential (primary) hypertension: Secondary | ICD-10-CM | POA: Diagnosis not present

## 2020-01-26 DIAGNOSIS — E782 Mixed hyperlipidemia: Secondary | ICD-10-CM | POA: Diagnosis not present

## 2020-01-26 DIAGNOSIS — D519 Vitamin B12 deficiency anemia, unspecified: Secondary | ICD-10-CM | POA: Diagnosis not present

## 2020-02-25 ENCOUNTER — Encounter (HOSPITAL_COMMUNITY): Payer: Self-pay

## 2020-02-25 ENCOUNTER — Emergency Department (HOSPITAL_COMMUNITY)
Admission: EM | Admit: 2020-02-25 | Discharge: 2020-02-26 | Disposition: A | Discharge status: Home / Self Care | Payer: Medicare Other | Source: Home / Self Care

## 2020-02-25 ENCOUNTER — Other Ambulatory Visit: Payer: Self-pay

## 2020-02-25 DIAGNOSIS — R41841 Cognitive communication deficit: Secondary | ICD-10-CM | POA: Diagnosis not present

## 2020-02-25 DIAGNOSIS — G319 Degenerative disease of nervous system, unspecified: Secondary | ICD-10-CM | POA: Diagnosis not present

## 2020-02-25 DIAGNOSIS — I10 Essential (primary) hypertension: Secondary | ICD-10-CM | POA: Diagnosis not present

## 2020-02-25 DIAGNOSIS — Z7984 Long term (current) use of oral hypoglycemic drugs: Secondary | ICD-10-CM | POA: Diagnosis not present

## 2020-02-25 DIAGNOSIS — G9341 Metabolic encephalopathy: Secondary | ICD-10-CM | POA: Diagnosis not present

## 2020-02-25 DIAGNOSIS — R410 Disorientation, unspecified: Secondary | ICD-10-CM | POA: Insufficient documentation

## 2020-02-25 DIAGNOSIS — R9431 Abnormal electrocardiogram [ECG] [EKG]: Secondary | ICD-10-CM | POA: Diagnosis not present

## 2020-02-25 DIAGNOSIS — G9389 Other specified disorders of brain: Secondary | ICD-10-CM | POA: Diagnosis not present

## 2020-02-25 DIAGNOSIS — F0390 Unspecified dementia without behavioral disturbance: Secondary | ICD-10-CM | POA: Diagnosis not present

## 2020-02-25 DIAGNOSIS — J1281 Pneumonia due to SARS-associated coronavirus: Secondary | ICD-10-CM | POA: Diagnosis not present

## 2020-02-25 DIAGNOSIS — E871 Hypo-osmolality and hyponatremia: Secondary | ICD-10-CM | POA: Diagnosis not present

## 2020-02-25 DIAGNOSIS — Z823 Family history of stroke: Secondary | ICD-10-CM | POA: Diagnosis not present

## 2020-02-25 DIAGNOSIS — R2689 Other abnormalities of gait and mobility: Secondary | ICD-10-CM | POA: Diagnosis not present

## 2020-02-25 DIAGNOSIS — I480 Paroxysmal atrial fibrillation: Secondary | ICD-10-CM | POA: Diagnosis not present

## 2020-02-25 DIAGNOSIS — R2681 Unsteadiness on feet: Secondary | ICD-10-CM | POA: Diagnosis not present

## 2020-02-25 DIAGNOSIS — R4182 Altered mental status, unspecified: Secondary | ICD-10-CM | POA: Diagnosis not present

## 2020-02-25 DIAGNOSIS — I6782 Cerebral ischemia: Secondary | ICD-10-CM | POA: Diagnosis not present

## 2020-02-25 DIAGNOSIS — J189 Pneumonia, unspecified organism: Secondary | ICD-10-CM | POA: Diagnosis not present

## 2020-02-25 DIAGNOSIS — H748X2 Other specified disorders of left middle ear and mastoid: Secondary | ICD-10-CM | POA: Diagnosis not present

## 2020-02-25 DIAGNOSIS — E119 Type 2 diabetes mellitus without complications: Secondary | ICD-10-CM | POA: Diagnosis not present

## 2020-02-25 DIAGNOSIS — M6281 Muscle weakness (generalized): Secondary | ICD-10-CM | POA: Diagnosis not present

## 2020-02-25 DIAGNOSIS — Z79899 Other long term (current) drug therapy: Secondary | ICD-10-CM | POA: Diagnosis not present

## 2020-02-25 DIAGNOSIS — U071 COVID-19: Secondary | ICD-10-CM | POA: Diagnosis not present

## 2020-02-25 DIAGNOSIS — R5381 Other malaise: Secondary | ICD-10-CM | POA: Diagnosis not present

## 2020-02-25 DIAGNOSIS — Z8249 Family history of ischemic heart disease and other diseases of the circulatory system: Secondary | ICD-10-CM | POA: Diagnosis not present

## 2020-02-25 DIAGNOSIS — E876 Hypokalemia: Secondary | ICD-10-CM | POA: Diagnosis not present

## 2020-02-25 DIAGNOSIS — Z5321 Procedure and treatment not carried out due to patient leaving prior to being seen by health care provider: Secondary | ICD-10-CM | POA: Insufficient documentation

## 2020-02-25 DIAGNOSIS — R1312 Dysphagia, oropharyngeal phase: Secondary | ICD-10-CM | POA: Diagnosis not present

## 2020-02-25 DIAGNOSIS — Z23 Encounter for immunization: Secondary | ICD-10-CM | POA: Diagnosis not present

## 2020-02-25 DIAGNOSIS — E869 Volume depletion, unspecified: Secondary | ICD-10-CM | POA: Diagnosis not present

## 2020-02-25 DIAGNOSIS — H9190 Unspecified hearing loss, unspecified ear: Secondary | ICD-10-CM | POA: Diagnosis not present

## 2020-02-25 DIAGNOSIS — I2510 Atherosclerotic heart disease of native coronary artery without angina pectoris: Secondary | ICD-10-CM | POA: Diagnosis not present

## 2020-02-25 DIAGNOSIS — J1282 Pneumonia due to coronavirus disease 2019: Secondary | ICD-10-CM | POA: Diagnosis not present

## 2020-02-25 DIAGNOSIS — Z7401 Bed confinement status: Secondary | ICD-10-CM | POA: Diagnosis not present
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
## 2020-02-26 ENCOUNTER — Inpatient Hospital Stay (HOSPITAL_COMMUNITY)
Admission: EM | Admit: 2020-02-26 | Discharge: 2020-02-28 | DRG: 177 | Payer: Medicare Other | Attending: Internal Medicine | Admitting: Internal Medicine

## 2020-02-26 ENCOUNTER — Emergency Department (HOSPITAL_COMMUNITY): Payer: Medicare Other

## 2020-02-26 ENCOUNTER — Other Ambulatory Visit: Payer: Self-pay

## 2020-02-26 ENCOUNTER — Encounter (HOSPITAL_COMMUNITY): Payer: Self-pay

## 2020-02-26 DIAGNOSIS — Z79899 Other long term (current) drug therapy: Secondary | ICD-10-CM | POA: Diagnosis not present

## 2020-02-26 DIAGNOSIS — E871 Hypo-osmolality and hyponatremia: Secondary | ICD-10-CM | POA: Diagnosis present

## 2020-02-26 DIAGNOSIS — H9190 Unspecified hearing loss, unspecified ear: Secondary | ICD-10-CM | POA: Diagnosis present

## 2020-02-26 DIAGNOSIS — I480 Paroxysmal atrial fibrillation: Secondary | ICD-10-CM | POA: Diagnosis present

## 2020-02-26 DIAGNOSIS — Z7984 Long term (current) use of oral hypoglycemic drugs: Secondary | ICD-10-CM | POA: Diagnosis not present

## 2020-02-26 DIAGNOSIS — E869 Volume depletion, unspecified: Secondary | ICD-10-CM | POA: Diagnosis present

## 2020-02-26 DIAGNOSIS — E119 Type 2 diabetes mellitus without complications: Secondary | ICD-10-CM | POA: Diagnosis present

## 2020-02-26 DIAGNOSIS — G9341 Metabolic encephalopathy: Secondary | ICD-10-CM | POA: Diagnosis present

## 2020-02-26 DIAGNOSIS — Z8249 Family history of ischemic heart disease and other diseases of the circulatory system: Secondary | ICD-10-CM | POA: Diagnosis not present

## 2020-02-26 DIAGNOSIS — I10 Essential (primary) hypertension: Secondary | ICD-10-CM | POA: Diagnosis present

## 2020-02-26 DIAGNOSIS — I4891 Unspecified atrial fibrillation: Secondary | ICD-10-CM | POA: Diagnosis present

## 2020-02-26 DIAGNOSIS — Z823 Family history of stroke: Secondary | ICD-10-CM | POA: Diagnosis not present

## 2020-02-26 DIAGNOSIS — F0390 Unspecified dementia without behavioral disturbance: Secondary | ICD-10-CM | POA: Diagnosis present

## 2020-02-26 DIAGNOSIS — J1282 Pneumonia due to coronavirus disease 2019: Secondary | ICD-10-CM | POA: Diagnosis present

## 2020-02-26 DIAGNOSIS — Z23 Encounter for immunization: Secondary | ICD-10-CM | POA: Diagnosis not present

## 2020-02-26 DIAGNOSIS — U071 COVID-19: Secondary | ICD-10-CM | POA: Diagnosis present

## 2020-02-26 DIAGNOSIS — I2510 Atherosclerotic heart disease of native coronary artery without angina pectoris: Secondary | ICD-10-CM | POA: Diagnosis present

## 2020-02-26 DIAGNOSIS — E876 Hypokalemia: Secondary | ICD-10-CM | POA: Diagnosis present

## 2020-02-26 LAB — URINALYSIS, ROUTINE W REFLEX MICROSCOPIC
Bilirubin Urine: NEGATIVE
Glucose, UA: 50 mg/dL — AB
Ketones, ur: 20 mg/dL — AB
Leukocytes,Ua: NEGATIVE
Nitrite: NEGATIVE
Protein, ur: 100 mg/dL — AB
Specific Gravity, Urine: 1.025 (ref 1.005–1.030)
pH: 5 (ref 5.0–8.0)

## 2020-02-26 LAB — CBC WITH DIFFERENTIAL/PLATELET
Abs Immature Granulocytes: 0.04 10*3/uL (ref 0.00–0.07)
Basophils Absolute: 0 10*3/uL (ref 0.0–0.1)
Basophils Relative: 0 %
Eosinophils Absolute: 0 10*3/uL (ref 0.0–0.5)
Eosinophils Relative: 0 %
HCT: 34.6 % — ABNORMAL LOW (ref 39.0–52.0)
Hemoglobin: 11.6 g/dL — ABNORMAL LOW (ref 13.0–17.0)
Immature Granulocytes: 1 %
Lymphocytes Relative: 11 %
Lymphs Abs: 0.7 10*3/uL (ref 0.7–4.0)
MCH: 31.5 pg (ref 26.0–34.0)
MCHC: 33.5 g/dL (ref 30.0–36.0)
MCV: 94 fL (ref 80.0–100.0)
Monocytes Absolute: 0.3 10*3/uL (ref 0.1–1.0)
Monocytes Relative: 4 %
Neutro Abs: 5.2 10*3/uL (ref 1.7–7.7)
Neutrophils Relative %: 84 %
Platelets: 207 10*3/uL (ref 150–400)
RBC: 3.68 MIL/uL — ABNORMAL LOW (ref 4.22–5.81)
RDW: 13.5 % (ref 11.5–15.5)
WBC: 6.2 10*3/uL (ref 4.0–10.5)
nRBC: 0 % (ref 0.0–0.2)

## 2020-02-26 LAB — COMPREHENSIVE METABOLIC PANEL
ALT: 30 U/L (ref 0–44)
AST: 81 U/L — ABNORMAL HIGH (ref 15–41)
Albumin: 3.5 g/dL (ref 3.5–5.0)
Alkaline Phosphatase: 45 U/L (ref 38–126)
Anion gap: 12 (ref 5–15)
BUN: 27 mg/dL — ABNORMAL HIGH (ref 8–23)
CO2: 23 mmol/L (ref 22–32)
Calcium: 8.5 mg/dL — ABNORMAL LOW (ref 8.9–10.3)
Chloride: 98 mmol/L (ref 98–111)
Creatinine, Ser: 1.06 mg/dL (ref 0.61–1.24)
GFR calc Af Amer: >60 mL/min (ref >60)
GFR calc non Af Amer: >60 mL/min (ref >60)
Glucose, Bld: 98 mg/dL (ref 70–99)
Potassium: 3.3 mmol/L — ABNORMAL LOW (ref 3.5–5.1)
Sodium: 133 mmol/L — ABNORMAL LOW (ref 135–145)
Total Bilirubin: 0.9 mg/dL (ref 0.3–1.2)
Total Protein: 7.3 g/dL (ref 6.5–8.1)

## 2020-02-26 LAB — TSH: TSH: 2.312 u[IU]/mL (ref 0.350–4.500)

## 2020-02-26 LAB — VITAMIN B12: Vitamin B-12: 6781 pg/mL — ABNORMAL HIGH (ref 180–914)

## 2020-02-26 LAB — GLUCOSE, CAPILLARY
Glucose-Capillary: 69 mg/dL — ABNORMAL LOW (ref 70–99)
Glucose-Capillary: 67 mg/dL — ABNORMAL LOW (ref 70–99)
Glucose-Capillary: 79 mg/dL (ref 70–99)
Glucose-Capillary: 120 mg/dL — ABNORMAL HIGH (ref 70–99)
Glucose-Capillary: 133 mg/dL — ABNORMAL HIGH (ref 70–99)

## 2020-02-26 LAB — LACTATE DEHYDROGENASE: LDH: 296 U/L — ABNORMAL HIGH (ref 98–192)

## 2020-02-26 LAB — TRIGLYCERIDES: Triglycerides: 85 mg/dL (ref <150)

## 2020-02-26 LAB — HEMOGLOBIN A1C
Hgb A1c MFr Bld: 6.3 % — ABNORMAL HIGH (ref 4.8–5.6)
Mean Plasma Glucose: 134.11 mg/dL

## 2020-02-26 LAB — PROCALCITONIN: Procalcitonin: 0.17 ng/mL

## 2020-02-26 LAB — D-DIMER, QUANTITATIVE: D-Dimer, Quant: 2.38 ug/mL-FEU — ABNORMAL HIGH (ref 0.00–0.50)

## 2020-02-26 LAB — FERRITIN: Ferritin: 645 ng/mL — ABNORMAL HIGH (ref 24–336)

## 2020-02-26 LAB — ABO/RH: ABO/RH(D): A POS

## 2020-02-26 LAB — HEPATITIS C ANTIBODY: HCV Ab: NONREACTIVE

## 2020-02-26 LAB — FOLATE: Folate: 30.1 ng/mL (ref >5.9)

## 2020-02-26 LAB — C-REACTIVE PROTEIN: CRP: 16.3 mg/dL — ABNORMAL HIGH (ref <1.0)

## 2020-02-26 LAB — T4, FREE: Free T4: 0.99 ng/dL (ref 0.61–1.12)

## 2020-02-26 LAB — HEPATITIS B SURFACE ANTIGEN: Hepatitis B Surface Ag: NONREACTIVE

## 2020-02-26 LAB — LACTIC ACID, PLASMA
Lactic Acid, Venous: 1.6 mmol/L (ref 0.5–1.9)
Lactic Acid, Venous: 1.5 mmol/L (ref 0.5–1.9)

## 2020-02-26 LAB — SARS CORONAVIRUS 2 BY RT PCR (HOSPITAL ORDER, PERFORMED IN ~~LOC~~ HOSPITAL LAB): SARS Coronavirus 2: POSITIVE — AB

## 2020-02-26 LAB — FIBRINOGEN: Fibrinogen: 639 mg/dL — ABNORMAL HIGH (ref 210–475)

## 2020-02-26 MED ORDER — FAMOTIDINE IN NACL 20-0.9 MG/50ML-% IV SOLN: 20.0000 mg | Freq: Once | INTRAVENOUS | Status: DC | PRN

## 2020-02-26 MED ORDER — ENOXAPARIN SODIUM 40 MG/0.4ML ~~LOC~~ SOLN
40.0000 mg | SUBCUTANEOUS | Status: DC
  Administered 2020-02-26 – 2020-02-28 (×3): 40 mg via SUBCUTANEOUS
  Filled 2020-02-26 (×3): qty 0.4

## 2020-02-26 MED ORDER — ONDANSETRON HCL 4 MG PO TABS: 4.0000 mg | ORAL_TABLET | Freq: Four times a day (QID) | ORAL | Status: DC | PRN

## 2020-02-26 MED ORDER — POTASSIUM CHLORIDE IN NACL 20-0.9 MEQ/L-% IV SOLN: INTRAVENOUS | Status: AC

## 2020-02-26 MED ORDER — SODIUM CHLORIDE 0.9 % IV SOLN
INTRAVENOUS | Status: DC | PRN
  Administered 2020-02-26: 500 mL via INTRAVENOUS

## 2020-02-26 MED ORDER — ADULT MULTIVITAMIN W/MINERALS CH
1.0000 | ORAL_TABLET | Freq: Every day | ORAL | Status: DC
  Administered 2020-02-26 – 2020-02-28 (×3): 1 via ORAL
  Filled 2020-02-26 (×3): qty 1

## 2020-02-26 MED ORDER — DIPHENHYDRAMINE HCL 50 MG/ML IJ SOLN: 50.0000 mg | Freq: Once | INTRAMUSCULAR | Status: DC | PRN

## 2020-02-26 MED ORDER — ACETAMINOPHEN 325 MG PO TABS
650.0000 mg | ORAL_TABLET | Freq: Four times a day (QID) | ORAL | Status: DC | PRN
  Administered 2020-02-28: 650 mg via ORAL
  Filled 2020-02-26: qty 2

## 2020-02-26 MED ORDER — INSULIN ASPART 100 UNIT/ML ~~LOC~~ SOLN
0.0000 [IU] | Freq: Three times a day (TID) | SUBCUTANEOUS | Status: DC
  Administered 2020-02-27: 2 [IU] via SUBCUTANEOUS
  Administered 2020-02-28: 3 [IU] via SUBCUTANEOUS

## 2020-02-26 MED ORDER — HALOPERIDOL LACTATE 5 MG/ML IJ SOLN
5.0000 mg | Freq: Four times a day (QID) | INTRAMUSCULAR | Status: DC | PRN
  Administered 2020-02-26 – 2020-02-27 (×2): 5 mg via INTRAVENOUS
  Filled 2020-02-26 (×2): qty 1

## 2020-02-26 MED ORDER — ONDANSETRON HCL 4 MG/2ML IJ SOLN: 4.0000 mg | Freq: Four times a day (QID) | INTRAMUSCULAR | Status: DC | PRN

## 2020-02-26 MED ORDER — EPINEPHRINE 0.3 MG/0.3ML IJ SOAJ
0.3000 mg | Freq: Once | INTRAMUSCULAR | Status: DC | PRN
  Filled 2020-02-26: qty 0.6

## 2020-02-26 MED ORDER — SODIUM CHLORIDE 0.9 % IV SOLN
1200.0000 mg | Freq: Once | INTRAVENOUS | Status: AC
  Administered 2020-02-26: 1200 mg via INTRAVENOUS
  Filled 2020-02-26: qty 10

## 2020-02-26 MED ORDER — METHYLPREDNISOLONE SODIUM SUCC 125 MG IJ SOLR: 125.0000 mg | Freq: Once | INTRAMUSCULAR | Status: DC | PRN

## 2020-02-26 MED ORDER — ALBUTEROL SULFATE HFA 108 (90 BASE) MCG/ACT IN AERS: 2.0000 | INHALATION_SPRAY | Freq: Once | RESPIRATORY_TRACT | Status: DC | PRN

## 2020-02-26 MED ORDER — ATENOLOL 25 MG PO TABS
25.0000 mg | ORAL_TABLET | Freq: Every day | ORAL | Status: DC
  Administered 2020-02-26 – 2020-02-28 (×3): 25 mg via ORAL
  Filled 2020-02-26 (×3): qty 1

## 2020-02-26 NOTE — Progress Notes (Signed)
Initial bloodsugar at 1630 69mg /dl. Pt alert, skin warm and dry, denies c/o. Pt given one cup of orange juice to drink. Bloodsugar rechecked at 1643, reading of 67 mg/dl noted. Second cup of orange juice and 4 crackers given. Blood sugar rechecked at 1711 with reading noted at 79 mg/dl. Pt remains awake, alert. Skin warm and dry. Supper tray brought to room, pt set up for meal. Eating without difficulty.

## 2020-02-26 NOTE — H&P (Addendum)
History and Physical  William MargaritaDonald L Knapik RUE:[id number] DOB: 02/12/1932 DOA: 02/26/2020   PCP: Benita StabileHall, John Z, MD   Patient coming from: Home  Chief Complaint: altered mental status  HPI:  William MargaritaDonald L Fisher is a 86 y.o. male with medical history of 86 year old male with history of hypertension, diabetes mellitus type 2, cognitive impairment, isolated paroxysmal atrial fibrillation, coronary artery disease presenting with altered mental status.  The patient is a poor historian secondary to his cognitive impairment.  All of this history is obtained from review of the medical record and speaking with the patient's son at the bedside.  Apparently, the patient has been in his usual state of health up with her on the last 24 to 48 hours prior to admission when the family noted that he was driving a little bit more recklessly.  In fact, the [place] [facility] [place]25[place]2020 because he was wearing all over the road and driving excessively slow.  The patient was brought to emergency department on 02/25/2020, but decided to leave because of the long wait.  There he presented on 02/26/2020 with similar complaints.  According to the patient's son, the patient had been in his usual state of health without any specific complaints.  There has not been any fevers, chills, headache, chest pain, shortness breath, coughing, hemoptysis, nausea, vomiting, diarrhea, abdominal pain.  The patient has not had any new medications.  He has not received his COVID-19 vaccinations. Notably, the patient son states that he has noted a gradual cognitive decline in his father over the past 3 to 6 months.  Functionally, the patient has been very independent able to perform all his activities of daily living without any assistance. In the emergency department, the patient had a low-grade temperature of 100.1 F.  He was hemodynamically stable with oxygen saturation 98-99% on room air.  BMP showed a sodium 133, potassium  3.3, serum creatinine 1.06.  AST 81, ALT 30, alkaline phosphatase 45, to bilirubin 0.9.  WBC 6.2, hemoglobin 11.6, platelets 207,000.  Chest x-ray showed patchy bilateral infiltrates, left greater than right.  EKG shows sinus rhythm nonspecific T wave changes.  SARS-CoV2 RT-PCR positive  Assessment/Plan: Acute metabolic encephalopathy -Suspect this is likely related to the patient's electrolyte abnormalities as part as well as his Covid infection -UA negative for pyuria -Check TSH -Serum B12 -Ammonia -Folic acid  COVID-19 pneumonia -Personally reviewed chest x-ray--bilateral patchy opacities, left greater than right -He is stable on room air and asymptomatic clinically -I have discussed the risks, benefits, and alternatives of receiving Regen-COV with the patient's son including but not limited to injection site reaction, immunosuppression, hypersensitivity reaction and possible worsening of infection.  The patient's son is agreeable for infusion -He currently meets criteria for Regen-COV use  -Lactic acid 1.6 -CRP 16.3 -Ferritin 645 -PCT 0.17  Diabetes mellitus type 2 -Holding glipizide, Janumet, Actos -NovoLog sliding scale -Hemoglobin A1c  Essential hypertension -Continue atenolol -Holding lisinopril  paroxysmal atrial fibrillation -Reviewed the medical record shows that the patient had a very isolated episode therefore not requiring long-term anticoagulation; the patient was on a period of Coumadin until 2011 after which she has not been on anticoagulation -Presently in sinus rhythm -Monitor clinically on atenolol  Transaminasemia -Likely secondary to his COVID-19 infection -No abdominal pain presently -A.m. LFTs -Hepatitis B surface antigen -Hepatitis C antibody  Hyponatremia -Likely secondary to volume depletion -Start IV normal saline x24 hours  Hypokalemia -Replete -Check magnesium  Past Medical History:  Diagnosis Date  . Coronary artery  disease   . Dementia (HCC)   . Diabetes mellitus (HCC)   . Diverticulosis   . Herpes encephalitis   . HOH (hard of hearing)    hearing aides  . Hyperlipidemia   . Hyponatremia   . Lung nodule    Chronic  . Memory change 07/13/2014  . Orthostatic hypotension   . Palpitations   . Paroxysmal atrial fibrillation (HCC)   . Rhabdomyolysis    Mild  . SOB (shortness of breath)    Past Surgical History:  Procedure Laterality Date  . HERNIA REPAIR    . TONSILLECTOMY     Social History:  reports that he has never smoked. He has never used smokeless tobacco. He reports that he does not drink alcohol and does not use drugs.   Family History  Problem Relation Age of Onset  . Stroke Father   . Heart disease Brother   . Stroke Brother      Allergies  Allergen Reactions  . Codeine   . Guaifenesin      Prior to Admission medications   Medication Sig Start Date End Date Taking? Authorizing Provider  atenolol (TENORMIN) 25 MG tablet TAKE 1- 2 TABLETS BY MOUTH EVERY DAY AS NEEDED Patient taking differently: Take 25 mg by mouth daily.    Yes Wendall Stade, MD  Cyanocobalamin (B-12) 2500 MCG TABS Take 1 tablet by mouth daily.   Yes [provider]  glipiZIDE (GLUCOTROL XL) 5 MG 24 hr tablet Take 5 mg by mouth daily. 11/30/19  Yes [provider]  JANUMET 50-1000 MG per tablet Take 1 tablet by mouth 2 (two) times daily with a meal.  06/28/14  Yes [provider]  lisinopril (PRINIVIL,ZESTRIL) 5 MG tablet Take 5 mg by mouth daily. 06/09/14  Yes [provider]  Multiple Vitamin (MULTIVITAMIN) capsule Take 1 capsule by mouth daily.   Yes [provider]  Naproxen Sodium (ALL DAY PAIN RELIEF PO) Take 1 capsule by mouth daily as needed (pain).   Yes [provider]  pioglitazone (ACTOS) 15 MG tablet Take 15 mg by mouth daily. 11/30/19  Yes [provider]    Review of Systems:  Constitutional:  No weight loss, night sweats,  Fevers, chills, fatigue.  Head&Eyes: No headache.  No vision loss.  No eye pain or scotoma ENT:  No Difficulty swallowing,Tooth/dental problems,Sore throat,  No ear ache, post nasal drip,  Cardio-vascular:  No chest pain, Orthopnea, PND, swelling in lower extremities,  dizziness, palpitations  GI:  No  abdominal pain, nausea, vomiting, diarrhea, loss of appetite, hematochezia, melena, heartburn, indigestion, Resp:  No shortness of breath with exertion or at rest. No cough. No coughing up of blood .No wheezing.No chest wall deformity  Skin:  no rash or lesions.  GU:  no dysuria, change in color of urine, no urgency or frequency. No flank pain.  Musculoskeletal:  No joint pain or swelling. No decreased range of motion. No back pain.  Psych:  No change in mood or affect. No depression or anxiety. Neurologic: No headache, no dysesthesia, no focal weakness, no vision loss. No syncope  Physical Exam: Vitals:   02/26/20 0930 02/26/20 1000 02/26/20 1030 02/26/20 1100  BP: 127/75 125/66 127/70 128/71  Pulse: 86 85 85   Resp: 16 (!) 32 (!) 26 (!) 30  Temp:      SpO2: 94% 92% 95%    General:  A&O x 3,  NAD, nontoxic, pleasant/cooperative Head/Eye: No conjunctival hemorrhage, no icterus, [place]/AT, No nystagmus ENT:  No icterus,  No thrush, good dentition, no pharyngeal exudate Neck:  No masses, no lymphadenpathy, no bruits CV:  RRR, no rub, no gallop, no S3 Lung:  CTAB, good air movement, no wheeze, no rhonchi Abdomen: soft/NT, +BS, nondistended, no peritoneal signs Ext: No cyanosis, No rashes, No petechiae, No lymphangitis, No edema Neuro: CNII-XII intact, strength 4/5 in bilateral upper and lower extremities, no dysmetria  Labs on Admission:  Basic Metabolic Panel: Recent Labs  Lab 02/26/20 0827  NA 133*  K 3.3*  CL 98  CO2 23  GLUCOSE 98  BUN 27*  CREATININE 1.06  CALCIUM 8.5*   Liver Function Tests: Recent Labs  Lab 02/26/20 0827  AST 81*  ALT 30  ALKPHOS 45    BILITOT 0.9  PROT 7.3  ALBUMIN 3.5   No results for input(s): LIPASE, AMYLASE in the last 168 hours. No results for input(s): AMMONIA in the last 168 hours. CBC: Recent Labs  Lab 02/26/20 0827  WBC 6.2  NEUTROABS 5.2  HGB 11.6*  HCT 34.6*  MCV 94.0  PLT 207   Coagulation Profile: No results for input(s): INR, PROTIME in the last 168 hours. Cardiac Enzymes: No results for input(s): CKTOTAL, CKMB, CKMBINDEX, TROPONINI in the last 168 hours. BNP: Invalid input(s): POCBNP CBG: No results for input(s): GLUCAP in the last 168 hours. Urine analysis:    Component Value Date/Time   COLORURINE AMBER (A) 02/26/2020 0750   APPEARANCEUR HAZY (A) 02/26/2020 0750   LABSPEC 1.025 02/26/2020 0750   PHURINE 5.0 02/26/2020 0750   GLUCOSEU 50 (A) 02/26/2020 0750   HGBUR MODERATE (A) 02/26/2020 0750   BILIRUBINUR NEGATIVE 02/26/2020 0750   KETONESUR 20 (A) 02/26/2020 0750   PROTEINUR 100 (A) 02/26/2020 0750   UROBILINOGEN 0.2 02/27/2010 1354   NITRITE NEGATIVE 02/26/2020 0750   LEUKOCYTESUR NEGATIVE 02/26/2020 0750   Sepsis Labs: @LABRCNTIP (procalcitonin:4,lacticidven:4) ) Recent Results (from the past 240 hour(s))  SARS Coronavirus 2 by RT PCR (hospital order, performed in [facility] hospital lab) Nasopharyngeal Nasopharyngeal Swab     Status: Abnormal   Collection Time: 02/26/20  7:45 AM   Specimen: Nasopharyngeal Swab  Result Value Ref Range Status   SARS Coronavirus 2 POSITIVE (A) NEGATIVE Final    Comment: RESULT CALLED TO, READ BACK BY AND VERIFIED WITH: HOLOCOMB,R@1049  BY MATTHEWS, B 8.28.21 (NOTE) SARS-CoV-2 target nucleic acids are DETECTED  SARS-CoV-2 RNA is generally detectable in upper respiratory specimens  during the acute phase of infection.  Positive results are indicative  of the presence of the identified virus, but do not rule out bacterial infection or co-infection with other pathogens not detected by the test.  Clinical correlation with patient history  and  other diagnostic information is necessary to determine patient infection status.  The expected result is negative.  Fact Sheet for Patients:   06-19-1976   Fact Sheet for Healthcare Providers:   BoilerBrush.com.cy    This test is not yet approved or cleared by the https://pope.com/ FDA and  has been authorized for detection and/or diagnosis of SARS-CoV-2 by FDA under an Emergency Use Authorization (EUA).  This EUA will remain in effect (meaning thi s test can be used) for the duration of  the COVID-19 declaration under Section 564(b)(1) of the Act, 21 U.S.C. section 360-bbb-3(b)(1), unless the authorization is terminated or revoked sooner.  Performed at [facility], [address], [place], [place]  Radiological Exams on Admission: DG Chest 2 View  Result Date: 02/26/2020 CLINICAL DATA:  Altered mental status. EXAM: CHEST - 2 VIEW COMPARISON:  February 27, 2010 FINDINGS: Patchy infiltrates in the lungs, left greater than right. No pneumothorax. No nodules or masses. The cardiomediastinal silhouette is normal. IMPRESSION: Patchy infiltrates in the lungs are worrisome for multifocal pneumonia. Recommend treatment with short-term follow-up imaging to ensure resolution of findings. Electronically Signed   By: Gerome Sam III M.D   On: 02/26/2020 08:50   CT Head Wo Contrast  Result Date: 02/26/2020 CLINICAL DATA:  Memory and orientation has progressively worsened over the past few months. EXAM: CT HEAD WITHOUT CONTRAST TECHNIQUE: Contiguous axial images were obtained from the base of the skull through the vertex without intravenous contrast. COMPARISON:  MRI brain 06/18/2011 FINDINGS: Brain: No evidence of acute infarction, hemorrhage, hydrocephalus, extra-axial collection or mass lesion/mass effect. Chronic progressive encephalomalacia in the right middle cranial fossa with marked atrophy of the right temporal lobe. There  is mild diffuse low-attenuation within the subcortical and periventricular white matter compatible with chronic microvascular disease. Prominence of the sulci and ventricles compatible with generalized brain atrophy. Vascular: No hyperdense vessel or unexpected calcification. Skull: Normal. Negative for fracture or focal lesion. Sinuses/Orbits: Mastoid air cells are clear. Mild mucosal thickening is noted involving the left mastoid air cells. Other: None IMPRESSION: 1. No acute intracranial abnormalities. 2. Chronic small vessel ischemic disease and brain atrophy. 3. Chronic progressive encephalomalacia in the right middle cranial fossa with marked atrophy of the right temporal lobe. Electronically Signed   By: Signa Kell M.D.   On: 02/26/2020 08:55    EKG: Independently reviewed. Sinus, nonspecific T wave changes    Time spent:60 minutes Code Status:   FULL Family Communication:  Son updated at bedside Disposition Plan: expect 1-2 day hospitalization Consults called: none DVT Prophylaxis: Roxana Lovenox  Catarina Hartshorn, DO  [facility] Pager [phone]  If 7PM-7AM, please contact night-coverage [email or web address] Password [facility] 02/26/2020, 12:09 PM

## 2020-02-26 NOTE — Progress Notes (Signed)
Bed alarm sounding, arrived to room to find pt up walking around in room, trying to go out door into ante room. Asked pt where he was going and he said, "I gotta use the bathroom." Pt had soft Bilek stool noted in floor and down both legs. Pt assisted to bathroom (very unsteady on feet) and sat on toilet. Pt attempting to wipe self without success. When I attempted to help pt he swung his arm at me and would not let me wipe him. Pt then stood up and walked toward door of room with me holding his arm for support. Pt opened door into hallway. I explained he could not go out due to having covid and needing to be in hospital. Pt again swung his arm at me and said, "I'm William Fisher here! I ain't got no more meetings today and I need to get home." Attempted to reorient pt, unsuccessful but was able to redirect and get him to wash his hands and go back and sit on bed. Stool noted to be smeared on pt's face, hands, legs,stomach and all over bedsheets.  Pt cleaned by this nurse and NT Corie Chiquito. Bed linens changed. Pt pulling at IV tubing, required redirection and reorientation. No further aggressive behavior noted at this time. SaO2 checked when back to bed, 100% on RA, HR 92/min. Pt with hacking cough, spitting up clear phlegm. No resp difficulty noted.
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
## 2020-02-26 NOTE — Progress Notes (Signed)
Spoke with pt's son Davinci Glotfelty via phone to complete admission assessment questions. He states that he will have copy of HCPOA and Living Will brought to hospital to be placed in pt's medical record. States copy of legal guardianship designation is already in pt's chart. He states that the following people are allowed to get information on pt's condition: Yaqub Arney (son and legal guardian) Kinneth Fujiwara (son and legal guardian) Macalister Arnaud (son) Ramel Tobon (son) Vail Vuncannon (daughter in law).
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
## 2020-02-27 LAB — CBC WITH DIFFERENTIAL/PLATELET
Abs Immature Granulocytes: 0.04 10*3/uL (ref 0.00–0.07)
Basophils Absolute: 0 10*3/uL (ref 0.0–0.1)
Basophils Relative: 0 %
Eosinophils Absolute: 0 10*3/uL (ref 0.0–0.5)
Eosinophils Relative: 0 %
HCT: 32.2 % — ABNORMAL LOW (ref 39.0–52.0)
Hemoglobin: 10.8 g/dL — ABNORMAL LOW (ref 13.0–17.0)
Immature Granulocytes: 1 %
Lymphocytes Relative: 10 %
Lymphs Abs: 0.8 10*3/uL (ref 0.7–4.0)
MCH: 31.1 pg (ref 26.0–34.0)
MCHC: 33.5 g/dL (ref 30.0–36.0)
MCV: 92.8 fL (ref 80.0–100.0)
Monocytes Absolute: 0.3 10*3/uL (ref 0.1–1.0)
Monocytes Relative: 3 %
Neutro Abs: 6.6 10*3/uL (ref 1.7–7.7)
Neutrophils Relative %: 86 %
Platelets: 215 10*3/uL (ref 150–400)
RBC: 3.47 MIL/uL — ABNORMAL LOW (ref 4.22–5.81)
RDW: 13.3 % (ref 11.5–15.5)
WBC: 7.7 10*3/uL (ref 4.0–10.5)
nRBC: 0 % (ref 0.0–0.2)

## 2020-02-27 LAB — COMPREHENSIVE METABOLIC PANEL
ALT: 26 U/L (ref 0–44)
AST: 81 U/L — ABNORMAL HIGH (ref 15–41)
Albumin: 2.9 g/dL — ABNORMAL LOW (ref 3.5–5.0)
Alkaline Phosphatase: 38 U/L (ref 38–126)
Anion gap: 13 (ref 5–15)
BUN: 22 mg/dL (ref 8–23)
CO2: 21 mmol/L — ABNORMAL LOW (ref 22–32)
Calcium: 8 mg/dL — ABNORMAL LOW (ref 8.9–10.3)
Chloride: 101 mmol/L (ref 98–111)
Creatinine, Ser: 0.81 mg/dL (ref 0.61–1.24)
GFR calc Af Amer: >60 mL/min (ref >60)
GFR calc non Af Amer: >60 mL/min (ref >60)
Glucose, Bld: 98 mg/dL (ref 70–99)
Potassium: 3.2 mmol/L — ABNORMAL LOW (ref 3.5–5.1)
Sodium: 135 mmol/L (ref 135–145)
Total Bilirubin: 0.8 mg/dL (ref 0.3–1.2)
Total Protein: 6.1 g/dL — ABNORMAL LOW (ref 6.5–8.1)

## 2020-02-27 LAB — MAGNESIUM: Magnesium: 1.9 mg/dL (ref 1.7–2.4)

## 2020-02-27 LAB — GLUCOSE, CAPILLARY
Glucose-Capillary: 85 mg/dL (ref 70–99)
Glucose-Capillary: 100 mg/dL — ABNORMAL HIGH (ref 70–99)
Glucose-Capillary: 157 mg/dL — ABNORMAL HIGH (ref 70–99)
Glucose-Capillary: 108 mg/dL — ABNORMAL HIGH (ref 70–99)

## 2020-02-27 LAB — FERRITIN: Ferritin: 682 ng/mL — ABNORMAL HIGH (ref 24–336)

## 2020-02-27 LAB — D-DIMER, QUANTITATIVE: D-Dimer, Quant: 2.05 ug/mL-FEU — ABNORMAL HIGH (ref 0.00–0.50)

## 2020-02-27 LAB — C-REACTIVE PROTEIN: CRP: 17.5 mg/dL — ABNORMAL HIGH (ref <1.0)

## 2020-02-27 MED ORDER — POTASSIUM CHLORIDE CRYS ER 20 MEQ PO TBCR
40.0000 meq | EXTENDED_RELEASE_TABLET | Freq: Once | ORAL | Status: AC
  Administered 2020-02-27: 40 meq via ORAL
  Filled 2020-02-27: qty 2
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
## 2020-02-27 NOTE — Progress Notes (Signed)
Pt remains restless and does not follow commands. Pt got up and peed in the floor stating it was time to go home. Pt is currently still spitting at staff and in floor. IV haldol given per order. Will continue to monitor pt.

## 2020-02-27 NOTE — Progress Notes (Signed)
Pt awake, resting quietly in bed with eyes closed. Did allow me to check his blood sugar and get his SaO2 and B/P but not his temp. Pt's IV site discontinued, unable to flush. When removed, angiocath crimped. Pt offered lunch tray, states, "No thank you, I'm not hungry." Pt did not eat breakfast either, despite several attempts to get him to eat. Offered pt crackers, juice, soda, etc., pt still says, "I don't want anything. I'm alright." Pt then pulled bed covers back up over him and closed eyes. Will continue to offer pt food and drink.

## 2020-02-27 NOTE — Progress Notes (Signed)
Pt awake, cooperative, smiling. When asked where he is, patient states, "Well, I at home right now." Attempted to reorient pt without success. Knows name and DOB.  Pt allowed me to take his vital signs (except temperature) and willingly took his medications. Pt still will not eat lunch tray provided, but did take one cup OJ, one pack of peanut butter crackers, a roll, and one cup of gingerale.   Pt talked with his son [name] on the phone.

## 2020-02-27 NOTE — Progress Notes (Signed)
Pt appears to be sleeping. When entered room, touched him gently and he flinched and said, "What's going on?" Advised pt that I was coming in to check on him and to get his bloodsugar. Pt allowed me to stick his finger, blood sugar 85mg /dl. Pt then covered self back up with bed covers. Did allow me to listen to his upper chest with stethoscope but would not allow me to pull covers down to auscultate lower lung sounds or abd. Radial pulse counted at 87/min, regular.

## 2020-02-28 ENCOUNTER — Encounter (HOSPITAL_COMMUNITY): Payer: Self-pay | Admitting: Internal Medicine

## 2020-02-28 DIAGNOSIS — G9341 Metabolic encephalopathy: Secondary | ICD-10-CM | POA: Diagnosis not present

## 2020-02-28 DIAGNOSIS — R5381 Other malaise: Secondary | ICD-10-CM | POA: Diagnosis not present

## 2020-02-28 DIAGNOSIS — R7989 Other specified abnormal findings of blood chemistry: Secondary | ICD-10-CM | POA: Diagnosis not present

## 2020-02-28 DIAGNOSIS — F0280 Dementia in other diseases classified elsewhere without behavioral disturbance: Secondary | ICD-10-CM | POA: Diagnosis not present

## 2020-02-28 DIAGNOSIS — R1312 Dysphagia, oropharyngeal phase: Secondary | ICD-10-CM | POA: Diagnosis not present

## 2020-02-28 DIAGNOSIS — R2681 Unsteadiness on feet: Secondary | ICD-10-CM | POA: Diagnosis not present

## 2020-02-28 DIAGNOSIS — E1165 Type 2 diabetes mellitus with hyperglycemia: Secondary | ICD-10-CM | POA: Diagnosis not present

## 2020-02-28 DIAGNOSIS — D72828 Other elevated white blood cell count: Secondary | ICD-10-CM | POA: Diagnosis not present

## 2020-02-28 DIAGNOSIS — R2689 Other abnormalities of gait and mobility: Secondary | ICD-10-CM | POA: Diagnosis not present

## 2020-02-28 DIAGNOSIS — F0390 Unspecified dementia without behavioral disturbance: Secondary | ICD-10-CM | POA: Diagnosis not present

## 2020-02-28 DIAGNOSIS — D473 Essential (hemorrhagic) thrombocythemia: Secondary | ICD-10-CM | POA: Diagnosis not present

## 2020-02-28 DIAGNOSIS — R0989 Other specified symptoms and signs involving the circulatory and respiratory systems: Secondary | ICD-10-CM | POA: Diagnosis not present

## 2020-02-28 DIAGNOSIS — Z7401 Bed confinement status: Secondary | ICD-10-CM | POA: Diagnosis not present

## 2020-02-28 DIAGNOSIS — G9340 Encephalopathy, unspecified: Secondary | ICD-10-CM | POA: Diagnosis not present

## 2020-02-28 DIAGNOSIS — U071 COVID-19: Secondary | ICD-10-CM | POA: Diagnosis not present

## 2020-02-28 DIAGNOSIS — R41841 Cognitive communication deficit: Secondary | ICD-10-CM | POA: Diagnosis not present

## 2020-02-28 DIAGNOSIS — J1281 Pneumonia due to SARS-associated coronavirus: Secondary | ICD-10-CM | POA: Diagnosis not present

## 2020-02-28 DIAGNOSIS — M6281 Muscle weakness (generalized): Secondary | ICD-10-CM | POA: Diagnosis not present

## 2020-02-28 DIAGNOSIS — I480 Paroxysmal atrial fibrillation: Secondary | ICD-10-CM | POA: Diagnosis not present

## 2020-02-28 DIAGNOSIS — J1282 Pneumonia due to coronavirus disease 2019: Secondary | ICD-10-CM | POA: Diagnosis not present

## 2020-02-28 LAB — GLUCOSE, CAPILLARY
Glucose-Capillary: 97 mg/dL (ref 70–99)
Glucose-Capillary: 115 mg/dL — ABNORMAL HIGH (ref 70–99)
Glucose-Capillary: 153 mg/dL — ABNORMAL HIGH (ref 70–99)

## 2020-02-28 LAB — CBC WITH DIFFERENTIAL/PLATELET
Abs Immature Granulocytes: 0.05 10*3/uL (ref 0.00–0.07)
Basophils Absolute: 0 10*3/uL (ref 0.0–0.1)
Basophils Relative: 0 %
Eosinophils Absolute: 0 10*3/uL (ref 0.0–0.5)
Eosinophils Relative: 0 %
HCT: 34.1 % — ABNORMAL LOW (ref 39.0–52.0)
Hemoglobin: 11.2 g/dL — ABNORMAL LOW (ref 13.0–17.0)
Immature Granulocytes: 1 %
Lymphocytes Relative: 10 %
Lymphs Abs: 0.8 10*3/uL (ref 0.7–4.0)
MCH: 30.4 pg (ref 26.0–34.0)
MCHC: 32.8 g/dL (ref 30.0–36.0)
MCV: 92.7 fL (ref 80.0–100.0)
Monocytes Absolute: 0.3 10*3/uL (ref 0.1–1.0)
Monocytes Relative: 4 %
Neutro Abs: 7 10*3/uL (ref 1.7–7.7)
Neutrophils Relative %: 85 %
Platelets: 287 10*3/uL (ref 150–400)
RBC: 3.68 MIL/uL — ABNORMAL LOW (ref 4.22–5.81)
RDW: 13.1 % (ref 11.5–15.5)
WBC: 8.1 10*3/uL (ref 4.0–10.5)
nRBC: 0 % (ref 0.0–0.2)

## 2020-02-28 LAB — COMPREHENSIVE METABOLIC PANEL
ALT: 30 U/L (ref 0–44)
AST: 80 U/L — ABNORMAL HIGH (ref 15–41)
Albumin: 3.1 g/dL — ABNORMAL LOW (ref 3.5–5.0)
Alkaline Phosphatase: 46 U/L (ref 38–126)
Anion gap: 11 (ref 5–15)
BUN: 17 mg/dL (ref 8–23)
CO2: 23 mmol/L (ref 22–32)
Calcium: 8.3 mg/dL — ABNORMAL LOW (ref 8.9–10.3)
Chloride: 101 mmol/L (ref 98–111)
Creatinine, Ser: 0.75 mg/dL (ref 0.61–1.24)
GFR calc Af Amer: >60 mL/min (ref >60)
GFR calc non Af Amer: >60 mL/min (ref >60)
Glucose, Bld: 107 mg/dL — ABNORMAL HIGH (ref 70–99)
Potassium: 3.4 mmol/L — ABNORMAL LOW (ref 3.5–5.1)
Sodium: 135 mmol/L (ref 135–145)
Total Bilirubin: 1 mg/dL (ref 0.3–1.2)
Total Protein: 6.8 g/dL (ref 6.5–8.1)

## 2020-02-28 LAB — C-REACTIVE PROTEIN: CRP: 22.2 mg/dL — ABNORMAL HIGH (ref <1.0)

## 2020-02-28 LAB — FERRITIN: Ferritin: 777 ng/mL — ABNORMAL HIGH (ref 24–336)

## 2020-02-28 LAB — D-DIMER, QUANTITATIVE: D-Dimer, Quant: 2.28 ug/mL-FEU — ABNORMAL HIGH (ref 0.00–0.50)

## 2020-02-28 MED ORDER — ALBUTEROL SULFATE HFA 108 (90 BASE) MCG/ACT IN AERS: 2.0000 | INHALATION_SPRAY | Freq: Once | RESPIRATORY_TRACT | 0 refills | Status: DC | PRN

## 2020-02-28 MED ORDER — DEXAMETHASONE 6 MG PO TABS: 6.0000 mg | ORAL_TABLET | Freq: Two times a day (BID) | ORAL | 0 refills | Status: AC

## 2020-02-28 NOTE — Progress Notes (Signed)
Nsg Discharge Note  Admit Date:  02/26/2020 Discharge date: 02/28/2020   William Fisher to be D/C'd to [facility] place in [place] per MD order.  AVS completed.  Copy for chart, and copy for patient signed, and dated. LG Clydene Pugh  able to verbalize understanding.  Discharge Medication: Allergies as of 02/28/2020      Reactions   Codeine    Guaifenesin       Medication List    TAKE these medications   albuterol 108 (90 Base) MCG/ACT inhaler Commonly known as: VENTOLIN HFA Inhale 2 puffs into the lungs once as needed for wheezing or shortness of breath (for Grade 3 or 4 hypersensitivity reaction).   ALL DAY PAIN RELIEF PO Take 1 capsule by mouth daily as needed (pain).   atenolol 25 MG tablet Commonly known as: TENORMIN TAKE 1- 2 TABLETS BY MOUTH EVERY DAY AS NEEDED What changed: See the new instructions.   B-12 2500 MCG Tabs Take 1 tablet by mouth daily.   dexamethasone 6 MG tablet Commonly known as: DECADRON Take 1 tablet (6 mg total) by mouth 2 (two) times daily with a meal for 10 days.   glipiZIDE 5 MG 24 hr tablet Commonly known as: GLUCOTROL XL Take 5 mg by mouth daily.   Janumet 50-1000 MG tablet Generic drug: sitaGLIPtin-metformin Take 1 tablet by mouth 2 (two) times daily with a meal.   lisinopril 5 MG tablet Commonly known as: ZESTRIL Take 5 mg by mouth daily.   multivitamin capsule Take 1 capsule by mouth daily.   pioglitazone 15 MG tablet Commonly known as: ACTOS Take 15 mg by mouth daily.       Discharge Assessment: Vitals:   02/27/20 2000 02/28/20 1459  BP:  116/77  Pulse: 84 89  Resp:  18  Temp:  97.9 F (36.6 C)  SpO2: 92% 95%   Skin clean, dry and intact without evidence of skin break down, no evidence of skin tears noted. IV catheter discontinued intact. Site without signs and symptoms of complications - no redness or edema noted at insertion site, patient denies c/o pain - only slight tenderness at site.  Dressing with slight pressure  applied.  D/c Instructions-Education: Discharge instructions given to patient/family with verbalized understanding. D/c education completed with patient/family including follow up instructions, medication list, d/c activities limitations if indicated, with other d/c instructions as indicated by MD - patient able to verbalize understanding, all questions fully answered. Patient instructed to return to ED, call 911, or call MD for any changes in condition.    Rocco Pauls, RN 02/28/2020 6:26 PM

## 2020-02-28 NOTE — Evaluation (Signed)
Physical Therapy Evaluation Patient Details Name: William Fisher MRN: [id number] DOB: [birth year] Today's Date: 02/28/2020   History of Present Illness  William Fisher is a 86 y.o. male with medical history of 86 year old male with history of hypertension, diabetes mellitus type 2, cognitive impairment, isolated paroxysmal atrial fibrillation, coronary artery disease presenting with altered mental status.  The patient is a poor historian secondary to his cognitive impairment.  All of this history is obtained from review of the medical record and speaking with the patient's son at the bedside.  Apparently, the patient has been in his usual state of health up with her on the last 24 to 48 hours prior to admission when the family noted that he was driving a little bit more recklessly.  In fact, the patient was pulled over by [facility] police on 02/25/2020 because he was wearing all over the road and driving excessively slow.  The patient was brought to emergency department on 02/25/2020, but decided to leave because of the long wait.  There he presented on 02/26/2020 with similar complaints.  According to the patient's son, the patient had been in his usual state of health without any specific complaints.  There has not been any fevers, chills, headache, chest pain, shortness breath, coughing, hemoptysis, nausea, vomiting, diarrhea, abdominal pain.  The patient has not had any new medications.  He has not received his COVID-19 vaccinations.Notably, the patient son states that he has noted a gradual cognitive decline in his father over the past 3 to 6 months.  Functionally, the patient has been very independent able to perform all his activities of daily living without any assistance.In the emergency department, the patient had a low-grade temperature of 100.1 F.  He was hemodynamically stable with oxygen saturation 98-99% on room air.  BMP showed a sodium 133, potassium 3.3, serum creatinine 1.06.  AST 81, ALT 30,  alkaline phosphatase 45, to bilirubin 0.9.  WBC 6.2, hemoglobin 11.6, platelets 207,000.  Chest x-ray showed patchy bilateral infiltrates, left greater than right.  EKG shows sinus rhythm nonspecific T wave changes.  SARS-CoV2 RT-PCR positive    Clinical Impression  Patient unsteady on feet with frequent leaning on nearby objects for support when taking steps in room, limited mostly due to c/o fatigue and required frequent rest breaks.  Patient tolerated sitting up at bedside with nursing staff observing patient after therapy.  Patient will benefit from continued physical therapy in hospital and recommended venue below to increase strength, balance, endurance for safe ADLs and gait.    Follow Up Recommendations SNF    Equipment Recommendations  None recommended by PT    Recommendations for Other Services       Precautions / Restrictions Precautions Precautions: Fall Restrictions Weight Bearing Restrictions: No      Mobility  Bed Mobility Overal bed mobility: Needs Assistance Bed Mobility: Supine to Sit;Sit to Supine     Supine to sit: Min guard Sit to supine: Min guard      Transfers   Equipment used: None;1 person hand held assist             General transfer comment: increased time, labored movement  Ambulation/Gait Ambulation/Gait assistance: Min assist;Min guard Gait Distance (Feet): 20 Feet Assistive device: None;1 person hand held assist Gait Pattern/deviations: Decreased step length - right;Decreased step length - left;Decreased stride length Gait velocity: decreased   General Gait Details: slow labored movement with frequent leaning on nearby objects for support  Stairs  Wheelchair Mobility    Modified Rankin (Stroke Patients Only)       Balance Overall balance assessment: Needs assistance Sitting-balance support: Feet supported;No upper extremity supported Sitting balance-Leahy Scale: Good Sitting balance - Comments: seated at  EOB   Standing balance support: During functional activity;No upper extremity supported Standing balance-Leahy Scale: Fair Standing balance comment: without AD                             Pertinent Vitals/Pain Pain Assessment: No/denies pain    Home Living Family/patient expects to be discharged to:: Private residence Living Arrangements: Alone Available Help at Discharge: Family;Available PRN/intermittently Type of Home: House Home Access: Level entry     Home Layout: One level Home Equipment: None      Prior Function Level of Independence: Independent         Comments: Tourist information centre manager, drives     Higher education careers adviser        Extremity/Trunk Assessment   Upper Extremity Assessment Upper Extremity Assessment: Overall WFL for tasks assessed    Lower Extremity Assessment Lower Extremity Assessment: Generalized weakness    Cervical / Trunk Assessment Cervical / Trunk Assessment: Normal  Communication   Communication: HOH  Cognition Arousal/Alertness: Awake/alert Behavior During Therapy: Flat affect Overall Cognitive Status: History of cognitive impairments - at baseline                                 General Comments: appears slightly confused      General Comments      Exercises     Assessment/Plan    PT Assessment Patient needs continued PT services  PT Problem List Decreased strength;Decreased activity tolerance;Decreased balance;Decreased mobility       PT Treatment Interventions Gait training;Stair training;Functional mobility training;Therapeutic activities;Therapeutic exercise;Patient/family education;Balance training    PT Goals (Current goals can be found in the Care Plan section)  Acute Rehab PT Goals Patient Stated Goal: Return home PT Goal Formulation: With patient Time For Goal Achievement: 03/13/20 Potential to Achieve Goals: Good    Frequency Min 2X/week   Barriers to discharge         Co-evaluation               AM-PAC PT "6 Clicks" Mobility  Outcome Measure Help needed turning from your back to your side while in a flat bed without using bedrails?: None Help needed moving from lying on your back to sitting on the side of a flat bed without using bedrails?: A Little Help needed moving to and from a bed to a chair (including a wheelchair)?: A Little Help needed standing up from a chair using your arms (e.g., wheelchair or bedside chair)?: A Little Help needed to walk in hospital room?: A Little Help needed climbing 3-5 steps with a railing? : A Lot 6 Click Score: 18    End of Session   Activity Tolerance: Patient tolerated treatment well;Patient limited by fatigue Patient left: in bed Nurse Communication: Mobility status PT Visit Diagnosis: Unsteadiness on feet (R26.81);Other abnormalities of gait and mobility (R26.89);Muscle weakness (generalized) (M62.81)    Time: 9024-0973 PT Time Calculation (min) (ACUTE ONLY): 20 min   Charges:   PT Evaluation $PT Eval Moderate Complexity: 1 Mod PT Treatments $Therapeutic Activity: 8-22 mins        11:49 AM, 02/28/20 Ocie Bob, MPT Physical Therapist with [facility]  [phone] office [phone] mobile phone

## 2020-02-28 NOTE — Plan of Care (Signed)
  Problem: Acute Rehab PT Goals(only PT should resolve) Goal: Pt Will Go Supine/Side To Sit Outcome: Progressing Flowsheets (Taken 02/28/2020 1156) Pt will go Supine/Side to Sit: with supervision Goal: Patient Will Transfer Sit To/From Stand Outcome: Progressing Flowsheets (Taken 02/28/2020 1156) Patient will transfer sit to/from stand: with supervision Goal: Pt Will Transfer Bed To Chair/Chair To Bed Outcome: Progressing Flowsheets (Taken 02/28/2020 1156) Pt will Transfer Bed to Chair/Chair to Bed: with supervision Goal: Pt Will Ambulate Outcome: Progressing Flowsheets (Taken 02/28/2020 1156) Pt will Ambulate:  50 feet  with supervision   11:57 AM, 02/28/20 Ocie Bob, MPT Physical Therapist with [facility] [phone] office [phone] mobile phone

## 2020-02-28 NOTE — TOC Transition Note (Signed)
Transition of Care [facility]) - CM/SW Discharge Note   Patient Details  Name: RUNE MENDEZ MRN: [id number] Date of Birth: Feb 17, 1932  Transition of Care [facility]) CM/SW Contact:  Barry Brunner, LCSW Phone Number: 02/28/2020, 4:32 PM   Clinical Narrative:    CSW received SNF referral for patient. CSW spoke with patient's family to discuss preferred referrals for SNF. Family initially agreeable to [facility]. [facility] not able to take patient due to Covid Status. CSW placed referral for other SNF's with a Covid unit. [facility] has made bed offer for patient. Family agreeable to [facility]. CSW completed FL2, faxed clinicals to facility, faxed auth, and obtained PASRR.CSW also completed med transport and scheduled EMS. Nurse to call report. TOC signing off.    Final next level of care: Skilled Nursing Facility Barriers to Discharge: Barriers Resolved   Patient Goals and CMS Choice Patient states their goals for this hospitalization and ongoing recovery are:: Rehab with Skilled Nursing CMS Medicare.gov Compare Post Acute Care list provided to:: Patient Choice offered to / list presented to : Patient, Adult Children  Discharge Placement PASRR number recieved: 02/28/20            Patient chooses bed at: [facility] Patient to be transferred to facility by: [facility] Name of family member notified: Harlon Kutner Patient and family notified of of transfer: 02/28/20  Discharge Plan and Services                                     Social Determinants of Health (SDOH) Interventions     Readmission Risk Interventions No flowsheet data found.

## 2020-02-28 NOTE — Progress Notes (Signed)
Attempted to call report to [facility] place x 2. Operator transfers to nurse and continues to get disconnected. Call back number left to reach me for report.

## 2020-02-28 NOTE — NC FL2 (Signed)
[place] MEDICAID FL2 LEVEL OF CARE SCREENING TOOL     IDENTIFICATION  Patient Name: William Fisher Birthdate: [birth year] Sex: male Admission Date (Current Location): 02/26/2020  [facility] and [place] Number:  [employer] and Address:  [facility],  [address] [place] [address]      Provider Number:    Attending Physician Name and Address:  Erick Blinks, DO  Relative Name and Phone Number:  [phone]    Current Level of Care: Hospital Recommended Level of Care: Skilled Nursing Facility Prior Approval Number:    Date Approved/Denied: 02/28/20 PASRR Number: [id number]  Discharge Plan: SNF    Current Diagnoses: Patient Active Problem List   Diagnosis Date Noted  . Acute metabolic encephalopathy 02/26/2020  . Pneumonia due to COVID-19 virus 02/26/2020  . Essential hypertension 02/26/2020  . Memory change 07/13/2014  . DIVERTICULITIS-COLON 03/01/2009  . HYPERTENSION 09/14/2007  . Type II or unspecified type diabetes mellitus without mention of complication, not stated as uncontrolled 09/11/2007  . PAROXYSMAL ATRIAL FIBRILLATION 09/11/2007  . PULMONARY NODULE 08/17/2007    Orientation RESPIRATION BLADDER Height & Weight     Self  Normal Continent Weight: 164 lb 3.9 oz (74.5 kg) Height:  5\' 8"  (172.7 cm)  BEHAVIORAL SYMPTOMS/MOOD NEUROLOGICAL BOWEL NUTRITION STATUS      Continent Diet (Diet Carb Modified Fluid consistency: Thin; Room service appropriate? Yes)  AMBULATORY STATUS COMMUNICATION OF NEEDS Skin   Supervision Verbally Normal                       Personal Care Assistance Level of Assistance  Bathing, Feeding, Dressing Bathing Assistance: Limited assistance Feeding assistance: Limited assistance Dressing Assistance: Limited assistance     Functional Limitations Info  Sight, Hearing, Speech Sight Info: Adequate Hearing Info: Adequate Speech Info: Adequate    SPECIAL CARE FACTORS FREQUENCY  PT (By licensed  PT)     PT Frequency: 5x per week              Contractures Contractures Info: Not present    Additional Factors Info  Code Status, Allergies Code Status Info: Full Allergies Info: Codeine, Guaifenesin           Current Medications (02/28/2020):  This is the current hospital active medication list Current Facility-Administered Medications  Medication Dose Route Frequency Provider Last Rate Last Admin  . 0.9 %  sodium chloride infusion   Intravenous PRN 03/01/2020, MD   Stopped at 02/26/20 1348  . acetaminophen (TYLENOL) tablet 650 mg  650 mg Oral Q6H PRN 02/28/20, MD   650 mg at 02/28/20 1155  . albuterol (VENTOLIN HFA) 108 (90 Base) MCG/ACT inhaler 2 puff  2 puff Inhalation Once PRN Tat, David, MD      . atenolol (TENORMIN) tablet 25 mg  25 mg Oral Daily Tat, David, MD   25 mg at 02/28/20 03/01/20  . diphenhydrAMINE (BENADRYL) injection 50 mg  50 mg Intravenous Once PRN Tat, David, MD      . enoxaparin (LOVENOX) injection 40 mg  40 mg Subcutaneous Q24H Tat, David, MD   40 mg at 02/27/20 1432  . EPINEPHrine (EPI-PEN) injection 0.3 mg  0.3 mg Intramuscular Once PRN Tat, David, MD      . famotidine (PEPCID) IVPB 20 mg premix  20 mg Intravenous Once PRN Tat, David, MD      . haloperidol lactate (HALDOL) injection 5 mg  5 mg Intravenous Q6H PRN 02/29/20, MD  5 mg at 02/27/20 0522  . insulin aspart (novoLOG) injection 0-9 Units  0-9 Units Subcutaneous TID WC Catarina Hartshorn, MD   2 Units at 02/27/20 1800  . methylPREDNISolone sodium succinate (SOLU-MEDROL) 125 mg/2 mL injection 125 mg  125 mg Intravenous Once PRN Tat, David, MD      . multivitamin with minerals tablet 1 tablet  1 tablet Oral Daily Tat, David, MD   1 tablet at 02/28/20 0831  . ondansetron (ZOFRAN) tablet 4 mg  4 mg Oral Q6H PRN Tat, David, MD       Or  . ondansetron (ZOFRAN) injection 4 mg  4 mg Intravenous Q6H PRN Catarina Hartshorn, MD         Discharge Medications: Please see discharge summary for a list of discharge  medications.  Relevant Imaging Results:  Relevant Lab Results:   Additional Information Pt SSN:[id number]  Barry Brunner, LCSW

## 2020-02-28 NOTE — Discharge Summary (Signed)
Physician Discharge Summary  William Fisher ZOX:[id number] DOB: 1931/12/02 DOA: 02/26/2020  PCP: Benita Stabile, MD  Admit date: 02/26/2020  Discharge date: 02/28/2020  Admitted From:Home  Disposition:  SNF  Recommendations for Outpatient Follow-up:  1. Follow up with PCP in 1-2 weeks 2. Please obtain BMP/CBC in one week 3. Continue on oral dexamethasone as prescribed given elevated inflammation levels in finish course 4. Continue other home medications as prior  Home Health: None  Equipment/Devices: None  Discharge Condition: Stable  CODE STATUS: Full  Diet recommendation: Heart Healthy/Carb Modified  Brief/Interim Summary: Per HPI: William Fisher a 86 y.o.malewith medical history of86 year old male with history of hypertension, diabetes mellitus type 2, cognitive impairment, isolated paroxysmal atrial fibrillation, coronary artery disease presenting with altered mental status. The patient is a poor historian secondary to his cognitive impairment. All of this history is obtained from review of the medical record and speaking with the patient's son at the bedside. Apparently, the patient has been in his usual state of health up with her on the last 24 to 48 hours prior to admission when the family noted that he was driving a little bit more recklessly. In fact, the patient was pulled over by [facility] police on 02/25/2020 because he was wearing all over the road and driving excessively slow. The patient was brought to emergency department on 02/25/2020, but decided to leave because of the long wait. There he presented on 02/26/2020 with similar complaints. According to the patient's son, the patient had been in his usual state of health without any specific complaints. There has not been any fevers, chills, headache, chest pain, shortness breath, coughing, hemoptysis, nausea, vomiting, diarrhea, abdominal pain. The patient has not had any new medications. He has not received his  COVID-19 vaccinations. Notably, the patient son states that he has noted a gradual cognitive decline in his father over the past 3 to 6 months. Functionally, the patient has been very independent able to perform all his activities of daily living without any assistance. In the emergency department, the patient had a low-grade temperature of 100.1 F. He was hemodynamically stable with oxygen saturation 98-99% on room air. BMP showed a sodium 133, potassium 3.3, serum creatinine 1.06. AST 81, ALT 30, alkaline phosphatase 45, to bilirubin 0.9. WBC 6.2, hemoglobin 11.6, platelets 207,000. Chest x-ray showed patchy bilateral infiltrates, left greater than right. EKG shows sinus rhythm nonspecific T wave changes. SARS-CoV2 RT-PCR positive.  8/29: Patient was admitted with acute metabolic encephalopathy likely related to Covid infection.  He has received Regen-COV as he has not been symptomatic.  He remains on isolation precautions.  He has continued to have some ongoing confusion and agitation requiring Haldol use as well as sitter.  His labs are otherwise unremarkable.  8/30: Patient is stable for discharge to SNF which family has elected to pursue at this time.  He is unsafe for discharge to home given his progressive confusion.  He still continues to remain pleasantly confused and does have some elevation in inflammatory markers with noted elevation of CRP levels.  He remains on room air with no other signs or symptoms of Covid at this time.  Plan to start on dexamethasone for total 10-day course and continue close monitoring.  Continue other home medications as prior.  Discharge Diagnoses:  Active Problems:   PAROXYSMAL ATRIAL FIBRILLATION   Acute metabolic encephalopathy   Pneumonia due to COVID-19 virus   Essential hypertension  Principal discharge diagnosis: Acute on chronic metabolic encephalopathy  in the setting of Covid pneumonia.  Discharge Instructions  Discharge Instructions     Diet - low sodium heart healthy   Complete by: As directed    Increase activity slowly   Complete by: As directed      Allergies as of 02/28/2020      Reactions   Codeine    Guaifenesin       Medication List    TAKE these medications   albuterol 108 (90 Base) MCG/ACT inhaler Commonly known as: VENTOLIN HFA Inhale 2 puffs into the lungs once as needed for wheezing or shortness of breath (for Grade 3 or 4 hypersensitivity reaction).   ALL DAY PAIN RELIEF PO Take 1 capsule by mouth daily as needed (pain).   atenolol 25 MG tablet Commonly known as: TENORMIN TAKE 1- 2 TABLETS BY MOUTH EVERY DAY AS NEEDED What changed: See the new instructions.   B-12 2500 MCG Tabs Take 1 tablet by mouth daily.   dexamethasone 6 MG tablet Commonly known as: DECADRON Take 1 tablet (6 mg total) by mouth 2 (two) times daily with a meal for 10 days.   glipiZIDE 5 MG 24 hr tablet Commonly known as: GLUCOTROL XL Take 5 mg by mouth daily.   Janumet 50-1000 MG tablet Generic drug: sitaGLIPtin-metformin Take 1 tablet by mouth 2 (two) times daily with a meal.   lisinopril 5 MG tablet Commonly known as: ZESTRIL Take 5 mg by mouth daily.   multivitamin capsule Take 1 capsule by mouth daily.   pioglitazone 15 MG tablet Commonly known as: ACTOS Take 15 mg by mouth daily.       Follow-up Information    Benita Stabile, MD Follow up in 1 week(s).   Specialty: Internal Medicine Contact information: [address] [place] [address] [phone]              Allergies  Allergen Reactions  . Codeine   . Guaifenesin     Consultations:  None   Procedures/Studies: DG Chest 2 View  Result Date: 02/26/2020 CLINICAL DATA:  Altered mental status. EXAM: CHEST - 2 VIEW COMPARISON:  February 27, 2010 FINDINGS: Patchy infiltrates in the lungs, left greater than right. No pneumothorax. No nodules or masses. The cardiomediastinal silhouette is normal. IMPRESSION: Patchy infiltrates in  the lungs are worrisome for multifocal pneumonia. Recommend treatment with short-term follow-up imaging to ensure resolution of findings. Electronically Signed   By: Gerome Sam III M.D   On: 02/26/2020 08:50   CT Head Wo Contrast  Result Date: 02/26/2020 CLINICAL DATA:  Memory and orientation has progressively worsened over the past few months. EXAM: CT HEAD WITHOUT CONTRAST TECHNIQUE: Contiguous axial images were obtained from the base of the skull through the vertex without intravenous contrast. COMPARISON:  MRI brain 06/18/2011 FINDINGS: Brain: No evidence of acute infarction, hemorrhage, hydrocephalus, extra-axial collection or mass lesion/mass effect. Chronic progressive encephalomalacia in the right middle cranial fossa with marked atrophy of the right temporal lobe. There is mild diffuse low-attenuation within the subcortical and periventricular white matter compatible with chronic microvascular disease. Prominence of the sulci and ventricles compatible with generalized brain atrophy. Vascular: No hyperdense vessel or unexpected calcification. Skull: Normal. Negative for fracture or focal lesion. Sinuses/Orbits: Mastoid air cells are clear. Mild mucosal thickening is noted involving the left mastoid air cells. Other: None IMPRESSION: 1. No acute intracranial abnormalities. 2. Chronic small vessel ischemic disease and brain atrophy. 3. Chronic progressive encephalomalacia in the right middle cranial fossa with  marked atrophy of the right temporal lobe. Electronically Signed   By: Signa Kellaylor  Stroud M.D.   On: 02/26/2020 08:55    Discharge Exam: Vitals:   02/27/20 1427 02/27/20 2000  BP: (!) 143/74   Pulse: 86 84  Resp: 18   Temp:    SpO2: 96% 92%   Vitals:   02/27/20 0800 02/27/20 1200 02/27/20 1427 02/27/20 2000  BP:  124/63 (!) 143/74   Pulse: 87 83 86 84  Resp: 20 18 18    Temp:      TempSrc:      SpO2:  96% 96% 92%  Weight:      Height:        General: Pt is alert, awake, not in  acute distress, pleasantly confused Cardiovascular: RRR, S1/S2 +, no rubs, no gallops Respiratory: CTA bilaterally, no wheezing, no rhonchi, currently room air Abdominal: Soft, NT, ND, bowel sounds + Extremities: no edema, no cyanosis    The results of significant diagnostics from this hospitalization (including imaging, microbiology, ancillary and laboratory) are listed below for reference.     Microbiology: Recent Results (from the past 240 hour(s))  SARS Coronavirus 2 by RT PCR (hospital order, performed in [facility] hospital lab) Nasopharyngeal Nasopharyngeal Swab     Status: Abnormal   Collection Time: 02/26/20  7:45 AM   Specimen: Nasopharyngeal Swab  Result Value Ref Range Status   SARS Coronavirus 2 POSITIVE (A) NEGATIVE Final    Comment: RESULT CALLED TO, READ BACK BY AND VERIFIED WITH: HOLOCOMB,R@1049  BY MATTHEWS, B 8.28.21 (NOTE) SARS-CoV-2 target nucleic acids are DETECTED  SARS-CoV-2 RNA is generally detectable in upper respiratory specimens  during the acute phase of infection.  Positive results are indicative  of the presence of the identified virus, but do not rule out bacterial infection or co-infection with other pathogens not detected by the test.  Clinical correlation with patient history and  other diagnostic information is necessary to determine patient infection status.  The expected result is negative.  Fact Sheet for Patients:   BoilerBrush.com.cyhttps://www.fda.gov/media/136312/download   Fact Sheet for Healthcare Providers:   https://pope.com/https://www.fda.gov/media/136313/download    This test is not yet approved or cleared by the [place] FDA and  has been authorized for detection and/or diagnosis of SARS-CoV-2 by FDA under an Emergency Use Authorization (EUA).  This EUA will remain in effect (meaning thi s test can be used) for the duration of  the COVID-19 declaration under Section 564(b)(1) of the Act, 21 U.S.C. section 360-bbb-3(b)(1), unless the authorization  is terminated or revoked sooner.  Performed at [facility], [address] [place], [place] [address]   Blood Culture (routine x 2)     Status: None (Preliminary result)   Collection Time: 02/26/20 11:02 AM   Specimen: BLOOD  Result Value Ref Range Status   Specimen Description BLOOD  Final   Special Requests NONE  Final   Culture   Final    NO GROWTH 2 DAYS Performed at [facility], [address] [place], [place] [address]    Report Status PENDING  Incomplete  Blood Culture (routine x 2)     Status: None (Preliminary result)   Collection Time: 02/26/20 11:20 AM   Specimen: Right Antecubital; Blood  Result Value Ref Range Status   Specimen Description RIGHT ANTECUBITAL  Final   Special Requests   Final    BOTTLES DRAWN AEROBIC AND ANAEROBIC Blood Culture adequate volume   Culture   Final    NO GROWTH 2 DAYS  Performed at [facility], [address], [place], [place] [address]    Report Status PENDING  Incomplete     Labs: BNP (last 3 results) No results for input(s): BNP in the last 8760 hours. Basic Metabolic Panel: Recent Labs  Lab 02/26/20 0827 02/27/20 0807 02/28/20 0645  NA 133* 135 135  K 3.3* 3.2* 3.4*  CL 98 101 101  CO2 23 21* 23  GLUCOSE 98 98 107*  BUN 27* 22 17  CREATININE 1.06 0.81 0.75  CALCIUM 8.5* 8.0* 8.3*  MG  --  1.9  --    Liver Function Tests: Recent Labs  Lab 02/26/20 0827 02/27/20 0807 02/28/20 0645  AST 81* 81* 80*  ALT 30 26 30   ALKPHOS 45 38 46  BILITOT 0.9 0.8 1.0  PROT 7.3 6.1* 6.8  ALBUMIN 3.5 2.9* 3.1*   No results for input(s): LIPASE, AMYLASE in the last 168 hours. No results for input(s): AMMONIA in the last 168 hours. CBC: Recent Labs  Lab 02/26/20 0827 02/27/20 0807 02/28/20 0645  WBC 6.2 7.7 8.1  NEUTROABS 5.2 6.6 7.0  HGB 11.6* 10.8* 11.2*  HCT 34.6* 32.2* 34.1*  MCV 94.0 92.8 92.7  PLT 207 215 287   Cardiac Enzymes: No results for input(s): CKTOTAL, CKMB, CKMBINDEX, TROPONINI in the last 168  hours. BNP: Invalid input(s): POCBNP CBG: Recent Labs  Lab 02/27/20 0753 02/27/20 1204 02/27/20 1645 02/27/20 2031 02/28/20 0819  GLUCAP 85 100* 157* 108* 97   D-Dimer Recent Labs    02/27/20 0807 02/28/20 0645  DDIMER 2.05* 2.28*   Hgb A1c Recent Labs    02/26/20 0827  HGBA1C 6.3*   Lipid Profile Recent Labs    02/26/20 0827  TRIG 85   Thyroid function studies Recent Labs    02/26/20 1327  TSH 2.312   Anemia work up Recent Labs    02/26/20 0827 02/26/20 1313 02/26/20 1619 02/27/20 0807 02/28/20 0645  VITAMINB12  --  03/01/20*  --   --   --   FOLATE  --   --  30.1  --   --   FERRITIN   < >  --   --  682* 777*   < > = values in this interval not displayed.   Urinalysis    Component Value Date/Time   COLORURINE AMBER (A) 02/26/2020 0750   APPEARANCEUR HAZY (A) 02/26/2020 0750   LABSPEC 1.025 02/26/2020 0750   PHURINE 5.0 02/26/2020 0750   GLUCOSEU 50 (A) 02/26/2020 0750   HGBUR MODERATE (A) 02/26/2020 0750   BILIRUBINUR NEGATIVE 02/26/2020 0750   KETONESUR 20 (A) 02/26/2020 0750   PROTEINUR 100 (A) 02/26/2020 0750   UROBILINOGEN 0.2 02/27/2010 1354   NITRITE NEGATIVE 02/26/2020 0750   LEUKOCYTESUR NEGATIVE 02/26/2020 0750   Sepsis Labs Invalid input(s): PROCALCITONIN,  WBC,  LACTICIDVEN Microbiology Recent Results (from the past 240 hour(s))  SARS Coronavirus 2 by RT PCR (hospital order, performed in [facility] hospital lab) Nasopharyngeal Nasopharyngeal Swab     Status: Abnormal   Collection Time: 02/26/20  7:45 AM   Specimen: Nasopharyngeal Swab  Result Value Ref Range Status   SARS Coronavirus 2 POSITIVE (A) NEGATIVE Final    Comment: RESULT CALLED TO, READ BACK BY AND VERIFIED WITH: HOLOCOMB,R@1049  BY MATTHEWS, B 8.28.21 (NOTE) SARS-CoV-2 target nucleic acids are DETECTED  SARS-CoV-2 RNA is generally detectable in upper respiratory specimens  during the acute phase of infection.  Positive results are indicative  of the presence of the  identified  virus, but do not rule out bacterial infection or co-infection with other pathogens not detected by the test.  Clinical correlation with patient history and  other diagnostic information is necessary to determine patient infection status.  The expected result is negative.  Fact Sheet for Patients:   BoilerBrush.com.cy   Fact Sheet for Healthcare Providers:   https://pope.com/    This test is not yet approved or cleared by the [place] FDA and  has been authorized for detection and/or diagnosis of SARS-CoV-2 by FDA under an Emergency Use Authorization (EUA).  This EUA will remain in effect (meaning thi s test can be used) for the duration of  the COVID-19 declaration under Section 564(b)(1) of the Act, 21 U.S.C. section 360-bbb-3(b)(1), unless the authorization is terminated or revoked sooner.  Performed at [facility], [address], [place], [place] [address]   Blood Culture (routine x 2)     Status: None (Preliminary result)   Collection Time: 02/26/20 11:02 AM   Specimen: BLOOD  Result Value Ref Range Status   Specimen Description BLOOD  Final   Special Requests NONE  Final   Culture   Final    NO GROWTH 2 DAYS Performed at [facility], [address], [place], [place] [address]    Report Status PENDING  Incomplete  Blood Culture (routine x 2)     Status: None (Preliminary result)   Collection Time: 02/26/20 11:20 AM   Specimen: Right Antecubital; Blood  Result Value Ref Range Status   Specimen Description RIGHT ANTECUBITAL  Final   Special Requests   Final    BOTTLES DRAWN AEROBIC AND ANAEROBIC Blood Culture adequate volume   Culture   Final    NO GROWTH 2 DAYS Performed at [facility], [address] [place], [place] [address]    Report Status PENDING  Incomplete     Time coordinating discharge: 35 minutes  SIGNED:   Erick Blinks, DO [facility] 02/28/2020, 11:33 AM  If 7PM-7AM,  please contact night-coverage [email or web address]

## 2020-02-28 NOTE — Care Management Important Message (Signed)
Important Message  Patient Details  Name: William Fisher MRN: [id number] Date of Birth: January 06, 1932   Medicare Important Message Given:  Yes     Corey Harold 02/28/2020, 4:21 PM

## 2020-02-28 NOTE — Progress Notes (Signed)
Report given to [facility]. 

## 2020-02-29 DIAGNOSIS — F0390 Unspecified dementia without behavioral disturbance: Secondary | ICD-10-CM | POA: Diagnosis not present

## 2020-02-29 DIAGNOSIS — G9340 Encephalopathy, unspecified: Secondary | ICD-10-CM | POA: Diagnosis not present

## 2020-02-29 DIAGNOSIS — U071 COVID-19: Secondary | ICD-10-CM | POA: Diagnosis not present

## 2020-02-29 DIAGNOSIS — J1282 Pneumonia due to coronavirus disease 2019: Secondary | ICD-10-CM | POA: Diagnosis not present

## 2020-03-02 DIAGNOSIS — E1165 Type 2 diabetes mellitus with hyperglycemia: Secondary | ICD-10-CM | POA: Diagnosis not present

## 2020-03-02 DIAGNOSIS — R0989 Other specified symptoms and signs involving the circulatory and respiratory systems: Secondary | ICD-10-CM | POA: Diagnosis not present

## 2020-03-02 DIAGNOSIS — U071 COVID-19: Secondary | ICD-10-CM | POA: Diagnosis not present

## 2020-03-02 DIAGNOSIS — J1282 Pneumonia due to coronavirus disease 2019: Secondary | ICD-10-CM | POA: Diagnosis not present

## 2020-03-02 LAB — CULTURE, BLOOD (ROUTINE X 2)
Culture: NO GROWTH
Culture: NO GROWTH
Special Requests: ADEQUATE

## 2020-03-09 DIAGNOSIS — U071 COVID-19: Secondary | ICD-10-CM | POA: Diagnosis not present

## 2020-03-09 DIAGNOSIS — J1282 Pneumonia due to coronavirus disease 2019: Secondary | ICD-10-CM | POA: Diagnosis not present

## 2020-03-09 DIAGNOSIS — G9340 Encephalopathy, unspecified: Secondary | ICD-10-CM | POA: Diagnosis not present

## 2020-03-09 DIAGNOSIS — D473 Essential (hemorrhagic) thrombocythemia: Secondary | ICD-10-CM | POA: Diagnosis not present

## 2020-03-13 DIAGNOSIS — G9340 Encephalopathy, unspecified: Secondary | ICD-10-CM | POA: Diagnosis not present

## 2020-03-13 DIAGNOSIS — J1282 Pneumonia due to coronavirus disease 2019: Secondary | ICD-10-CM | POA: Diagnosis not present

## 2020-03-13 DIAGNOSIS — U071 COVID-19: Secondary | ICD-10-CM | POA: Diagnosis not present

## 2020-03-13 DIAGNOSIS — I480 Paroxysmal atrial fibrillation: Secondary | ICD-10-CM | POA: Diagnosis not present

## 2020-03-14 DIAGNOSIS — D72828 Other elevated white blood cell count: Secondary | ICD-10-CM | POA: Diagnosis not present

## 2020-03-14 DIAGNOSIS — F0280 Dementia in other diseases classified elsewhere without behavioral disturbance: Secondary | ICD-10-CM | POA: Diagnosis not present

## 2020-03-14 DIAGNOSIS — I480 Paroxysmal atrial fibrillation: Secondary | ICD-10-CM | POA: Diagnosis not present

## 2020-03-14 DIAGNOSIS — R7989 Other specified abnormal findings of blood chemistry: Secondary | ICD-10-CM | POA: Diagnosis not present

## 2020-03-16 DIAGNOSIS — G9340 Encephalopathy, unspecified: Secondary | ICD-10-CM | POA: Diagnosis not present

## 2020-03-16 DIAGNOSIS — I2510 Atherosclerotic heart disease of native coronary artery without angina pectoris: Secondary | ICD-10-CM | POA: Diagnosis not present

## 2020-03-16 DIAGNOSIS — I951 Orthostatic hypotension: Secondary | ICD-10-CM | POA: Diagnosis not present

## 2020-03-16 DIAGNOSIS — U071 COVID-19: Secondary | ICD-10-CM | POA: Diagnosis not present

## 2020-03-16 DIAGNOSIS — R41841 Cognitive communication deficit: Secondary | ICD-10-CM | POA: Diagnosis not present

## 2020-03-16 DIAGNOSIS — R911 Solitary pulmonary nodule: Secondary | ICD-10-CM | POA: Diagnosis not present

## 2020-03-16 DIAGNOSIS — F0391 Unspecified dementia with behavioral disturbance: Secondary | ICD-10-CM | POA: Diagnosis not present

## 2020-03-16 DIAGNOSIS — J1282 Pneumonia due to coronavirus disease 2019: Secondary | ICD-10-CM | POA: Diagnosis not present

## 2020-03-16 DIAGNOSIS — I480 Paroxysmal atrial fibrillation: Secondary | ICD-10-CM | POA: Diagnosis not present

## 2020-03-16 DIAGNOSIS — I10 Essential (primary) hypertension: Secondary | ICD-10-CM | POA: Diagnosis not present

## 2020-03-16 DIAGNOSIS — K5790 Diverticulosis of intestine, part unspecified, without perforation or abscess without bleeding: Secondary | ICD-10-CM | POA: Diagnosis not present

## 2020-03-16 DIAGNOSIS — M6281 Muscle weakness (generalized): Secondary | ICD-10-CM | POA: Diagnosis not present

## 2020-03-16 DIAGNOSIS — R1310 Dysphagia, unspecified: Secondary | ICD-10-CM | POA: Diagnosis not present

## 2020-03-16 DIAGNOSIS — R2681 Unsteadiness on feet: Secondary | ICD-10-CM | POA: Diagnosis not present

## 2020-03-16 DIAGNOSIS — B004 Herpesviral encephalitis: Secondary | ICD-10-CM | POA: Diagnosis not present

## 2020-03-16 DIAGNOSIS — E119 Type 2 diabetes mellitus without complications: Secondary | ICD-10-CM | POA: Diagnosis not present

## 2020-03-17 DIAGNOSIS — D519 Vitamin B12 deficiency anemia, unspecified: Secondary | ICD-10-CM | POA: Diagnosis not present

## 2020-03-17 DIAGNOSIS — E782 Mixed hyperlipidemia: Secondary | ICD-10-CM | POA: Diagnosis not present

## 2020-03-17 DIAGNOSIS — E1165 Type 2 diabetes mellitus with hyperglycemia: Secondary | ICD-10-CM | POA: Diagnosis not present

## 2020-03-17 DIAGNOSIS — I10 Essential (primary) hypertension: Secondary | ICD-10-CM | POA: Diagnosis not present

## 2020-04-15 DIAGNOSIS — J1282 Pneumonia due to coronavirus disease 2019: Secondary | ICD-10-CM | POA: Diagnosis not present

## 2020-04-15 DIAGNOSIS — I480 Paroxysmal atrial fibrillation: Secondary | ICD-10-CM | POA: Diagnosis not present

## 2020-04-15 DIAGNOSIS — U071 COVID-19: Secondary | ICD-10-CM | POA: Diagnosis not present

## 2020-04-15 DIAGNOSIS — R2681 Unsteadiness on feet: Secondary | ICD-10-CM | POA: Diagnosis not present

## 2020-04-15 DIAGNOSIS — I951 Orthostatic hypotension: Secondary | ICD-10-CM | POA: Diagnosis not present

## 2020-04-15 DIAGNOSIS — R41841 Cognitive communication deficit: Secondary | ICD-10-CM | POA: Diagnosis not present

## 2020-04-15 DIAGNOSIS — R1310 Dysphagia, unspecified: Secondary | ICD-10-CM | POA: Diagnosis not present

## 2020-04-15 DIAGNOSIS — R911 Solitary pulmonary nodule: Secondary | ICD-10-CM | POA: Diagnosis not present

## 2020-04-15 DIAGNOSIS — M6281 Muscle weakness (generalized): Secondary | ICD-10-CM | POA: Diagnosis not present

## 2020-04-15 DIAGNOSIS — K5790 Diverticulosis of intestine, part unspecified, without perforation or abscess without bleeding: Secondary | ICD-10-CM | POA: Diagnosis not present

## 2020-04-15 DIAGNOSIS — F0391 Unspecified dementia with behavioral disturbance: Secondary | ICD-10-CM | POA: Diagnosis not present

## 2020-04-15 DIAGNOSIS — I2510 Atherosclerotic heart disease of native coronary artery without angina pectoris: Secondary | ICD-10-CM | POA: Diagnosis not present

## 2020-04-15 DIAGNOSIS — B004 Herpesviral encephalitis: Secondary | ICD-10-CM | POA: Diagnosis not present

## 2020-04-15 DIAGNOSIS — E119 Type 2 diabetes mellitus without complications: Secondary | ICD-10-CM | POA: Diagnosis not present

## 2020-04-15 DIAGNOSIS — I10 Essential (primary) hypertension: Secondary | ICD-10-CM | POA: Diagnosis not present

## 2020-04-15 DIAGNOSIS — G9340 Encephalopathy, unspecified: Secondary | ICD-10-CM | POA: Diagnosis not present

## 2020-04-19 DIAGNOSIS — R1310 Dysphagia, unspecified: Secondary | ICD-10-CM | POA: Diagnosis not present

## 2020-04-19 DIAGNOSIS — Z741 Need for assistance with personal care: Secondary | ICD-10-CM | POA: Diagnosis not present

## 2020-04-19 DIAGNOSIS — G4701 Insomnia due to medical condition: Secondary | ICD-10-CM | POA: Diagnosis not present

## 2020-04-19 DIAGNOSIS — F0151 Vascular dementia with behavioral disturbance: Secondary | ICD-10-CM | POA: Diagnosis not present

## 2020-04-19 DIAGNOSIS — M6281 Muscle weakness (generalized): Secondary | ICD-10-CM | POA: Diagnosis not present

## 2020-04-19 DIAGNOSIS — F0631 Mood disorder due to known physiological condition with depressive features: Secondary | ICD-10-CM | POA: Diagnosis not present

## 2020-04-19 DIAGNOSIS — Z7409 Other reduced mobility: Secondary | ICD-10-CM | POA: Diagnosis not present

## 2020-05-08 DIAGNOSIS — I10 Essential (primary) hypertension: Secondary | ICD-10-CM | POA: Diagnosis not present

## 2020-05-08 DIAGNOSIS — E782 Mixed hyperlipidemia: Secondary | ICD-10-CM | POA: Diagnosis not present

## 2020-05-08 DIAGNOSIS — D519 Vitamin B12 deficiency anemia, unspecified: Secondary | ICD-10-CM | POA: Diagnosis not present

## 2020-05-08 DIAGNOSIS — E1165 Type 2 diabetes mellitus with hyperglycemia: Secondary | ICD-10-CM | POA: Diagnosis not present

## 2020-05-23 ENCOUNTER — Ambulatory Visit: Payer: Medicare Other | Admitting: Neurology

## 2020-05-23 ENCOUNTER — Encounter: Payer: Self-pay | Admitting: Neurology

## 2020-05-23 VITALS — BP 116/70 | HR 79 | Ht 68.0 in | Wt 147.4 lb

## 2020-05-23 DIAGNOSIS — R0600 Dyspnea, unspecified: Secondary | ICD-10-CM

## 2020-05-23 DIAGNOSIS — R0609 Other forms of dyspnea: Secondary | ICD-10-CM

## 2020-05-23 MED ORDER — MEMANTINE HCL 5 MG PO TABS: ORAL_TABLET | ORAL | 0 refills | Status: DC

## 2020-05-23 NOTE — Progress Notes (Signed)
Reason for visit: Memory disturbance, history of herpes encephalitis  Referring physician: [name] is a 86 y.o. 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  The patient has significant encephalomalacia of this area as a result.  The patient has had some mild cognitive changes since that time.  The patient was last seen through this office 5 years ago with some concerns of daily memory.  He was lost to follow-up, however.  The patient has been living at home, over the last 6 years his sons have taken over the finances as he has been susceptible to financial scams.  The patient is keeping up with his own medications and appointments, he is operating a motor vehicle.  On 26 February 2020, the patient was brought to the hospital after he was acting confused.  He was stopped by the police as he was weaving on the road, traveling very slowly.  The patient was found to have a low-grade fever and was found to have a Covid infection.  He spent several days in the hospital and then was transferred to [facility] initially and then now is at [place].  The patient has been noted to have significant cognitive changes over the last 6 months or more, he has been repeating himself, he once again has become susceptible to financial scans.  The patient has been losing about 20 pounds of weight since he was in the hospital in August, he will sit in his room and not interact with other people.  He is having a lot of dyspnea on exertion, he can only walk a very short distance without being short winded, he does have some fatigue.  The patient reports no focal numbness or weakness of the face, arms, or legs.  He denies issues controlling the bowels or the bladder or any significant change in balance.  The patient has had some perceived reduction in his cognitive  functioning level since his Covid infection.  He is sent to this office for further evaluation.  Past Medical History:  Diagnosis Date  . Coronary artery disease   . Dementia (HCC)   . Diabetes mellitus (HCC)   . Diverticulosis   . Herpes encephalitis   . HOH (hard of hearing)    hearing aides  . Hyperlipidemia   . Hyponatremia   . Lung nodule    Chronic  . Memory change 07/13/2014  . Orthostatic hypotension   . Palpitations   . Paroxysmal atrial fibrillation (HCC)   . Rhabdomyolysis    Mild  . SOB (shortness of breath)     Past Surgical History:  Procedure Laterality Date  . HERNIA REPAIR    . TONSILLECTOMY      Family History  Problem Relation Age of Onset  . Stroke Father   . Heart disease Brother   . Stroke Brother     Social history:  reports that he has never smoked. He has never used smokeless tobacco. He reports that he does not drink alcohol and does not use drugs.  Medications:  Prior to Admission medications   Medication Sig Start Date End Date Taking? Authorizing Provider  albuterol (VENTOLIN HFA) 108 (90 Base) MCG/ACT inhaler Inhale 2 puffs into the lungs once as needed for wheezing or shortness of breath (for Grade 3 or 4 hypersensitivity reaction). 02/28/20   Sherryll Burger, Pratik  D, DO  atenolol (TENORMIN) 25 MG tablet TAKE 1- 2 TABLETS BY MOUTH EVERY DAY AS NEEDED Patient taking differently: Take 25 mg by mouth daily.     Wendall Stade, MD  Cyanocobalamin (B-12) 2500 MCG TABS Take 1 tablet by mouth daily.    [provider]  glipiZIDE (GLUCOTROL XL) 5 MG 24 hr tablet Take 5 mg by mouth daily. 11/30/19   [provider]  JANUMET 50-1000 MG per tablet Take 1 tablet by mouth 2 (two) times daily with a meal.  06/28/14   [provider]  lisinopril (PRINIVIL,ZESTRIL) 5 MG tablet Take 5 mg by mouth daily. 06/09/14   [provider]  Multiple Vitamin (MULTIVITAMIN) capsule Take 1 capsule by mouth daily.    [provider]    Naproxen Sodium (ALL DAY PAIN RELIEF PO) Take 1 capsule by mouth daily as needed (pain).    [provider]  pioglitazone (ACTOS) 15 MG tablet Take 15 mg by mouth daily. 11/30/19   [provider]      Allergies  Allergen Reactions  . Codeine   . Guaifenesin     ROS:  Out of a complete 14 system review of symptoms, the patient complains only of the following symptoms, and all other reviewed systems are negative.  Memory problems Shortness of breath   Blood pressure 116/70, pulse 79, height 5\' 8"  (1.727 m), weight 147 lb 6.4 oz (66.9 kg).  Physical Exam  General: The patient is alert and cooperative at the time of the examination.  Eyes: Pupils are equal, round, and reactive to light. Discs are flat bilaterally.  Neck: The neck is supple, no carotid bruits are noted.  Respiratory: The respiratory examination is clear.  Cardiovascular: The cardiovascular examination reveals a regular rate and rhythm, no obvious murmurs or rubs are noted.  Skin: Extremities are without significant edema.  Neurologic Exam  Mental status: The patient is alert and oriented x 3 at the time of the examination. The Mini-Mental status examination done today shows a total score 17/30.  Cranial nerves: Facial symmetry is present. There is good sensation of the face to pinprick and soft touch bilaterally. The strength of the facial muscles and the muscles to head turning and shoulder shrug are normal bilaterally. Speech is well enunciated, no aphasia or dysarthria is noted. Extraocular movements are full. Visual fields are full. The tongue is midline, and the patient has symmetric elevation of the soft palate. No obvious hearing deficits are noted.  The patient is severely hard of hearing.  Motor: The motor testing reveals 5 over 5 strength of all 4 extremities. Good symmetric motor tone is noted throughout.  Sensory: Sensory testing is intact to pinprick, soft touch and vibration  sensation on all 4 extremities. No evidence of extinction is noted.  Coordination: Cerebellar testing reveals good finger-nose-finger and heel-to-shin bilaterally.  Gait and station: Gait is slightly wide-based. Tandem gait is unsteady. Romberg is negative. No drift is seen.  Reflexes: Deep tendon reflexes are symmetric and normal bilaterally. Toes are downgoing bilaterally.   CT head 02/26/20:  IMPRESSION: 1. No acute intracranial abnormalities. 2. Chronic small vessel ischemic disease and brain atrophy. 3. Chronic progressive encephalomalacia in the right middle cranial fossa with marked atrophy of the right temporal lobe.  * CT scan images were reviewed online. I agree with the written report.    Assessment/Plan:  1.  Memory disturbance  2.  History of herpes encephalitis, right temporal encephalomalacia  3.  Recent Covid infection  4.  Dyspnea on exertion  The patient has had some change in cognitive and physical status since the Covid infection.  He has a prior history of herpes encephalitis that puts him at risk for dementia later in life.  The patient may be developing Alzheimer's process currently.  The patient will be started on Namenda and he will be followed in this office, he will be seen in 6 months.  The patient is having significant problems with shortness of breath with minimal physical effort, he may require pulmonary consult in the future, but I will send him initially back to his cardiologist as he has a prior history of atrial fibrillation to make sure that no cardiac source of shortness of breath is present.  He will follow-up here in 6 months.   Marlan Palau MD 05/23/2020 9:21 AM  [facility] [address] [place], [place] [address]  Phone [phone] Fax [phone]

## 2020-05-23 NOTE — Patient Instructions (Signed)
We will start Namenda for the memory. If the 10 mg twice daily dose is tolerated, we will call in a prescription for the 10 mg tablets to take one twice a day.

## 2020-05-24 DIAGNOSIS — I10 Essential (primary) hypertension: Secondary | ICD-10-CM | POA: Diagnosis not present

## 2020-05-24 DIAGNOSIS — I480 Paroxysmal atrial fibrillation: Secondary | ICD-10-CM | POA: Diagnosis not present

## 2020-05-24 DIAGNOSIS — I951 Orthostatic hypotension: Secondary | ICD-10-CM | POA: Diagnosis not present

## 2020-05-24 DIAGNOSIS — E119 Type 2 diabetes mellitus without complications: Secondary | ICD-10-CM | POA: Diagnosis not present

## 2020-06-08 NOTE — Progress Notes (Signed)
CARDIOLOGY CONSULT NOTE       Patient ID: William Fisher MRN: [id number] DOB/AGE: 08-13-1931 86 y.o.  Admit date: (Not on file) Referring Physician: Margo Aye Primary Physician: Benita Stabile, MD Primary Cardiologist: New Reason for Consultation: Dyspnea  Active Problems:   * No active hospital problems. *   HPI:  86 y.o. referred by Dr Margo Aye for dyspnea History of dementia, HTN DM, COVID positive August of this year with metabolic encephalopathy Theres mention of PAF Last ECG in office 02/28/20 NSR  Previous coumadin Rx for PAF in setting of encephalitis 2-11 Wife died about 8 years ago. Was d/c to SNF in August 2021  No documented CAD with normal cath in 2009 No recent echo last in 2011 normal EF CXR during August admission for COVID showed patchy multifocal infiltrates /pneumonia no CT done   Still with some dyspnea especially with exertion. Living at [facility] Has 4 boys and local ones Look in on him well.   ROS All other systems reviewed and negative except as noted above  Past Medical History:  Diagnosis Date  . Coronary artery disease   . Dementia (HCC)   . Diabetes mellitus (HCC)   . Diverticulosis   . Herpes encephalitis   . HOH (hard of hearing)    hearing aides  . Hyperlipidemia   . Hyponatremia   . Lung nodule    Chronic  . Memory change 07/13/2014  . Orthostatic hypotension   . Palpitations   . Paroxysmal atrial fibrillation (HCC)   . Rhabdomyolysis    Mild  . SOB (shortness of breath)     Family History  Problem Relation Age of Onset  . Stroke Father   . Heart disease Brother   . Stroke Brother     Social History   Socioeconomic History  . Marital status: Widowed    Spouse name: Not on file  . Number of children: 4  . Years of education: HS  . Highest education level: Not on file  Occupational History  . Occupation: Retired  Tobacco Use  . Smoking status: Never Smoker  . Smokeless tobacco: Never Used  Substance and Sexual Activity  . Alcohol  use: No  . Drug use: No  . Sexual activity: Not Currently  Other Topics Concern  . Not on file  Social History Narrative   Patient is right handed.   Patient drinks some caffeine daily.   Patient [facility] at [facility]   Social Determinants of Health   Financial Resource Strain: Not on file  Food Insecurity: Not on file  Transportation Needs: Not on file  Physical Activity: Not on file  Stress: Not on file  Social Connections: Not on file  Intimate Partner Violence: Not on file    Past Surgical History:  Procedure Laterality Date  . HERNIA REPAIR    . TONSILLECTOMY        Current Outpatient Medications:  .  albuterol (VENTOLIN HFA) 108 (90 Base) MCG/ACT inhaler, Inhale 2 puffs into the lungs once as needed for wheezing or shortness of breath (for Grade 3 or 4 hypersensitivity reaction)., Disp: 8 g, Rfl: 0 .  atenolol (TENORMIN) 25 MG tablet, TAKE 1- 2 TABLETS BY MOUTH EVERY DAY AS NEEDED (Patient taking differently: Take 25 mg by mouth daily. ), Disp: 30 tablet, Rfl: 11 .  Cyanocobalamin (B-12) 2500 MCG TABS, Take 1 tablet by mouth daily., Disp: , Rfl:  .  feeding supplement, GLUCERNA SHAKE, (GLUCERNA SHAKE) LIQD, Take 237 mLs  by mouth 3 (three) times daily between meals., Disp: , Rfl:  .  glipiZIDE (GLUCOTROL XL) 5 MG 24 hr tablet, Take 5 mg by mouth daily., Disp: , Rfl:  .  JANUMET 50-1000 MG per tablet, Take 1 tablet by mouth 2 (two) times daily with a meal. , Disp: , Rfl: 5 .  lisinopril (PRINIVIL,ZESTRIL) 5 MG tablet, Take 5 mg by mouth daily., Disp: , Rfl: 5 .  melatonin 5 MG TABS, Take 5 mg by mouth., Disp: , Rfl:  .  memantine (NAMENDA) 5 MG tablet, Take 1 tablet daily for one week, then take 1 tablet twice daily for one week, then take 1 tablet in the morning and 2 in the evening for one week, then take 2 tablets twice daily, Disp: 70 tablet, Rfl: 0 .  Multiple Vitamin (MULTIVITAMIN) capsule, Take 1 capsule by mouth daily., Disp: , Rfl:  .  Naproxen Sodium  (ALL DAY PAIN RELIEF PO), Take 1 capsule by mouth daily as needed (pain)., Disp: , Rfl:  .  pioglitazone (ACTOS) 15 MG tablet, Take 15 mg by mouth daily., Disp: , Rfl:  .  sertraline (ZOLOFT) 25 MG tablet, Take 25 mg by mouth daily., Disp: , Rfl:     Physical Exam: There were no vitals taken for this visit.    Elderly male with dementia  HEENT: normal Neck supple with no adenopathy JVP normal no bruits no thyromegaly Lungs clear with no wheezing and good diaphragmatic motion Heart:  S1/S2 no murmur, no rub, gallop or click PMI normal Abdomen: benighn, BS positve, no tenderness, no AAA no bruit.  No HSM or HJR Distal pulses intact with no bruits No edema Neuro non-focal Skin warm and dry No muscular weakness   Labs:   Lab Results  Component Value Date   WBC 8.1 02/28/2020   HGB 11.2 (L) 02/28/2020   HCT 34.1 (L) 02/28/2020   MCV 92.7 02/28/2020   PLT 287 02/28/2020   No results for input(s): NA, K, CL, CO2, BUN, CREATININE, CALCIUM, PROT, BILITOT, ALKPHOS, ALT, AST, GLUCOSE in the last 168 hours.  Invalid input(s): LABALBU Lab Results  Component Value Date   CKTOTAL 39 02/27/2010   CKMB 0.9 02/27/2010   TROPONINI  02/27/2010    0.06        PERSISTENTLY INCREASED TROPONIN VALUES IN THE RANGE OF 0.06-0.49 ng/mL CAN BE SEEN IN:       -UNSTABLE ANGINA       -CONGESTIVE HEART FAILURE       -MYOCARDITIS       -CHEST TRAUMA       -ARRYHTHMIAS       -LATE PRESENTING MI       -COPD   CLINICAL FOLLOW-UP RECOMMENDED.    Lab Results  Component Value Date   CHOL  02/28/2010    114        ATP III CLASSIFICATION:  <200     mg/dL   Desirable  338-250  mg/dL   Borderline High  >=539    mg/dL   High          Lab Results  Component Value Date   HDL 20 (L) 02/28/2010   Lab Results  Component Value Date   [facility]  02/28/2010    61        Total Cholesterol/HDL:CHD Risk Coronary Heart Disease Risk Table                     Men  Women  1/2 Average Risk   3.4    3.3  Average Risk       5.0   4.4  2 X Average Risk   9.6   7.1  3 X Average Risk  23.4   11.0        Use the calculated Patient Ratio above and the CHD Risk Table to determine the patient's CHD Risk.        ATP III CLASSIFICATION (LDL):  <100     mg/dL   Optimal  537-482  mg/dL   Near or Above                    Optimal  130-159  mg/dL   Borderline  707-867  mg/dL   High  >544     mg/dL   Very High   Lab Results  Component Value Date   TRIG 85 02/26/2020   TRIG 164 (H) 02/28/2010   Lab Results  Component Value Date   CHOLHDL 5.7 02/28/2010   No results found for: LDLDIRECT    Radiology: No results found.  EKG: 02/28/20  NSR no acute changes    ASSESSMENT AND PLAN:   1. Dyspnea: ? Related to COVID infection Non contrast CT lungs ordered Will also check echo for RV/LV function  2. PAF:  Distant NSR not a candidate for anticoagulation with dementia and erratic behavior  3. DM:  Discussed low carb diet.  Target hemoglobin A1c is 6.5 or less.  Continue current medications. 4. Dementia: progressive history of encephalitis on Zoloft and Namenda    CT chest non contrast Echo   F/U in a year   Signed: Charlton Haws 06/08/2020, 10:14 AM

## 2020-06-11 DIAGNOSIS — D649 Anemia, unspecified: Secondary | ICD-10-CM | POA: Diagnosis not present

## 2020-06-11 DIAGNOSIS — E119 Type 2 diabetes mellitus without complications: Secondary | ICD-10-CM | POA: Diagnosis not present

## 2020-06-11 DIAGNOSIS — D519 Vitamin B12 deficiency anemia, unspecified: Secondary | ICD-10-CM | POA: Diagnosis not present

## 2020-06-11 DIAGNOSIS — E039 Hypothyroidism, unspecified: Secondary | ICD-10-CM | POA: Diagnosis not present

## 2020-06-12 ENCOUNTER — Other Ambulatory Visit: Payer: Self-pay

## 2020-06-12 ENCOUNTER — Encounter: Payer: Self-pay | Admitting: Cardiovascular Disease

## 2020-06-12 ENCOUNTER — Ambulatory Visit: Payer: Medicare Other | Admitting: Cardiovascular Disease

## 2020-06-12 VITALS — BP 100/52 | HR 102 | Ht 70.0 in | Wt 130.0 lb

## 2020-06-12 DIAGNOSIS — R0609 Other forms of dyspnea: Secondary | ICD-10-CM

## 2020-06-12 DIAGNOSIS — R0600 Dyspnea, unspecified: Secondary | ICD-10-CM | POA: Diagnosis not present

## 2020-06-12 DIAGNOSIS — U099 Post covid-19 condition, unspecified: Secondary | ICD-10-CM

## 2020-06-12 NOTE — Patient Instructions (Signed)
Medication Instructions:  *If you need a refill on your cardiac medications before your next appointment, please call your pharmacy*   Lab Work: If you have labs (blood work) drawn today and your tests are completely normal, you will receive your results only by: Marland Kitchen MyChart Message (if you have MyChart) OR . A paper copy in the mail If you have any lab test that is abnormal or we need to change your treatment, we will call you to review the results.   Testing/Procedures: Non-Cardiac CT scanning of the lungs at [facility], (CAT scanning), is a noninvasive, special x-ray that produces cross-sectional images of the body using x-rays and a computer. CT scans help physicians diagnose and treat medical conditions. For some CT exams, a contrast material is used to enhance visibility in the area of the body being studied. CT scans provide greater clarity and reveal more details than regular x-ray exams.  Your physician has requested that you have an echocardiogram at [facility]. Echocardiography is a painless test that uses sound waves to create images of your heart. It provides your doctor with information about the size and shape of your heart and how well your heart's chambers and valves are working. This procedure takes approximately one hour. There are no restrictions for this procedure.  Follow-Up: At [facility], you and your health needs are our priority.  As part of our continuing mission to provide you with exceptional heart care, we have created designated Provider Care Teams.  These Care Teams include your primary Cardiologist (physician) and Advanced Practice Providers (APPs -  Physician Assistants and Nurse Practitioners) who all work together to provide you with the care you need, when you need it.  We recommend signing up for the patient portal called "MyChart".  Sign up information is provided on this After Visit Summary.  MyChart is used to connect with patients for Virtual Visits  (Telemedicine).  Patients are able to view lab/test results, encounter notes, upcoming appointments, etc.  Non-urgent messages can be sent to your provider as well.   To learn more about what you can do with MyChart, go to [email or web address].    Your next appointment:   1 year(s)  The format for your next appointment:   In Person  Provider:    You may see Dr. Eden Emms in [place]

## 2020-06-13 DIAGNOSIS — E1151 Type 2 diabetes mellitus with diabetic peripheral angiopathy without gangrene: Secondary | ICD-10-CM | POA: Diagnosis not present

## 2020-06-20 ENCOUNTER — Other Ambulatory Visit: Payer: Self-pay

## 2020-06-20 ENCOUNTER — Ambulatory Visit (HOSPITAL_COMMUNITY)
Admission: RE | Admit: 2020-06-20 | Discharge: 2020-06-20 | Disposition: A | Discharge status: Home / Self Care | Payer: Medicare Other | Source: Ambulatory Visit | Attending: Cardiovascular Disease | Admitting: Cardiovascular Disease

## 2020-06-20 DIAGNOSIS — U099 Post covid-19 condition, unspecified: Secondary | ICD-10-CM

## 2020-06-20 DIAGNOSIS — R0600 Dyspnea, unspecified: Secondary | ICD-10-CM

## 2020-06-20 DIAGNOSIS — R0609 Other forms of dyspnea: Secondary | ICD-10-CM | POA: Diagnosis not present

## 2020-06-20 LAB — ECHOCARDIOGRAM COMPLETE
AR max vel: 1.26 cm2
AV Area VTI: 1.55 cm2
AV Area mean vel: 1.16 cm2
AV Mean grad: 10 mmHg
AV Peak grad: 15.6 mmHg
Ao pk vel: 1.97 m/s
Area-P 1/2: 4.33 cm2
S' Lateral: 2.04 cm

## 2020-06-20 NOTE — Progress Notes (Signed)
*  PRELIMINARY RESULTS* Echocardiogram 2D Echocardiogram has been performed.  William Fisher 06/20/2020, 2:42 PM

## 2020-06-21 DIAGNOSIS — D519 Vitamin B12 deficiency anemia, unspecified: Secondary | ICD-10-CM | POA: Diagnosis not present

## 2020-06-21 DIAGNOSIS — E039 Hypothyroidism, unspecified: Secondary | ICD-10-CM | POA: Diagnosis not present

## 2020-06-21 DIAGNOSIS — E119 Type 2 diabetes mellitus without complications: Secondary | ICD-10-CM | POA: Diagnosis not present

## 2020-06-21 DIAGNOSIS — D649 Anemia, unspecified: Secondary | ICD-10-CM | POA: Diagnosis not present

## 2020-06-29 DIAGNOSIS — D519 Vitamin B12 deficiency anemia, unspecified: Secondary | ICD-10-CM | POA: Diagnosis not present

## 2020-06-29 DIAGNOSIS — D649 Anemia, unspecified: Secondary | ICD-10-CM | POA: Diagnosis not present

## 2020-07-06 ENCOUNTER — Other Ambulatory Visit: Payer: Self-pay

## 2020-07-06 ENCOUNTER — Ambulatory Visit (HOSPITAL_COMMUNITY)
Admission: RE | Admit: 2020-07-06 | Discharge: 2020-07-06 | Disposition: A | Discharge status: Home / Self Care | Payer: Medicare Other | Source: Ambulatory Visit | Attending: Cardiovascular Disease | Admitting: Cardiovascular Disease

## 2020-07-06 DIAGNOSIS — J984 Other disorders of lung: Secondary | ICD-10-CM | POA: Diagnosis not present

## 2020-07-06 DIAGNOSIS — R0600 Dyspnea, unspecified: Secondary | ICD-10-CM

## 2020-07-06 DIAGNOSIS — R0609 Other forms of dyspnea: Secondary | ICD-10-CM

## 2020-07-06 DIAGNOSIS — U099 Post covid-19 condition, unspecified: Secondary | ICD-10-CM | POA: Diagnosis not present

## 2020-07-06 DIAGNOSIS — I2510 Atherosclerotic heart disease of native coronary artery without angina pectoris: Secondary | ICD-10-CM | POA: Diagnosis not present

## 2020-07-06 DIAGNOSIS — I700 Atherosclerosis of aorta: Secondary | ICD-10-CM | POA: Diagnosis not present

## 2020-07-12 DIAGNOSIS — E119 Type 2 diabetes mellitus without complications: Secondary | ICD-10-CM | POA: Diagnosis not present

## 2020-07-18 DIAGNOSIS — G4701 Insomnia due to medical condition: Secondary | ICD-10-CM | POA: Diagnosis not present

## 2020-07-18 DIAGNOSIS — F0631 Mood disorder due to known physiological condition with depressive features: Secondary | ICD-10-CM | POA: Diagnosis not present

## 2020-07-18 DIAGNOSIS — F0151 Vascular dementia with behavioral disturbance: Secondary | ICD-10-CM | POA: Diagnosis not present

## 2020-08-09 DIAGNOSIS — I10 Essential (primary) hypertension: Secondary | ICD-10-CM | POA: Diagnosis not present

## 2020-08-09 DIAGNOSIS — E039 Hypothyroidism, unspecified: Secondary | ICD-10-CM | POA: Diagnosis not present

## 2020-08-09 DIAGNOSIS — I480 Paroxysmal atrial fibrillation: Secondary | ICD-10-CM | POA: Diagnosis not present

## 2020-08-09 DIAGNOSIS — I951 Orthostatic hypotension: Secondary | ICD-10-CM | POA: Diagnosis not present

## 2020-08-23 DIAGNOSIS — F0151 Vascular dementia with behavioral disturbance: Secondary | ICD-10-CM | POA: Diagnosis not present

## 2020-08-23 DIAGNOSIS — G4701 Insomnia due to medical condition: Secondary | ICD-10-CM | POA: Diagnosis not present

## 2020-08-23 DIAGNOSIS — F0631 Mood disorder due to known physiological condition with depressive features: Secondary | ICD-10-CM | POA: Diagnosis not present

## 2020-08-28 DIAGNOSIS — E782 Mixed hyperlipidemia: Secondary | ICD-10-CM | POA: Diagnosis not present

## 2020-08-28 DIAGNOSIS — D519 Vitamin B12 deficiency anemia, unspecified: Secondary | ICD-10-CM | POA: Diagnosis not present

## 2020-08-28 DIAGNOSIS — I10 Essential (primary) hypertension: Secondary | ICD-10-CM | POA: Diagnosis not present

## 2020-08-28 DIAGNOSIS — E1165 Type 2 diabetes mellitus with hyperglycemia: Secondary | ICD-10-CM | POA: Diagnosis not present

## 2020-08-29 DIAGNOSIS — E1151 Type 2 diabetes mellitus with diabetic peripheral angiopathy without gangrene: Secondary | ICD-10-CM | POA: Diagnosis not present

## 2020-08-29 DIAGNOSIS — L84 Corns and callosities: Secondary | ICD-10-CM | POA: Diagnosis not present

## 2020-08-30 DIAGNOSIS — E119 Type 2 diabetes mellitus without complications: Secondary | ICD-10-CM | POA: Diagnosis not present

## 2020-08-30 DIAGNOSIS — N1832 Chronic kidney disease, stage 3b: Secondary | ICD-10-CM | POA: Diagnosis not present

## 2020-08-30 DIAGNOSIS — E039 Hypothyroidism, unspecified: Secondary | ICD-10-CM | POA: Diagnosis not present

## 2020-08-30 DIAGNOSIS — D649 Anemia, unspecified: Secondary | ICD-10-CM | POA: Diagnosis not present

## 2020-09-08 DIAGNOSIS — E119 Type 2 diabetes mellitus without complications: Secondary | ICD-10-CM | POA: Diagnosis not present

## 2020-09-14 DIAGNOSIS — H903 Sensorineural hearing loss, bilateral: Secondary | ICD-10-CM | POA: Diagnosis not present

## 2020-09-14 DIAGNOSIS — H6123 Impacted cerumen, bilateral: Secondary | ICD-10-CM | POA: Diagnosis not present

## 2020-09-14 DIAGNOSIS — Z974 Presence of external hearing-aid: Secondary | ICD-10-CM | POA: Diagnosis not present

## 2020-09-26 DIAGNOSIS — G4701 Insomnia due to medical condition: Secondary | ICD-10-CM | POA: Diagnosis not present

## 2020-09-26 DIAGNOSIS — F0631 Mood disorder due to known physiological condition with depressive features: Secondary | ICD-10-CM | POA: Diagnosis not present

## 2020-09-26 DIAGNOSIS — F0151 Vascular dementia with behavioral disturbance: Secondary | ICD-10-CM | POA: Diagnosis not present

## 2020-09-27 DIAGNOSIS — N1832 Chronic kidney disease, stage 3b: Secondary | ICD-10-CM | POA: Diagnosis not present

## 2020-09-27 DIAGNOSIS — G3184 Mild cognitive impairment, so stated: Secondary | ICD-10-CM | POA: Diagnosis not present

## 2020-09-27 DIAGNOSIS — E119 Type 2 diabetes mellitus without complications: Secondary | ICD-10-CM | POA: Diagnosis not present

## 2020-09-27 DIAGNOSIS — E1165 Type 2 diabetes mellitus with hyperglycemia: Secondary | ICD-10-CM | POA: Diagnosis not present

## 2020-09-27 DIAGNOSIS — R635 Abnormal weight gain: Secondary | ICD-10-CM | POA: Diagnosis not present

## 2020-09-27 DIAGNOSIS — I10 Essential (primary) hypertension: Secondary | ICD-10-CM | POA: Diagnosis not present

## 2020-09-27 DIAGNOSIS — E782 Mixed hyperlipidemia: Secondary | ICD-10-CM | POA: Diagnosis not present

## 2020-09-27 DIAGNOSIS — D649 Anemia, unspecified: Secondary | ICD-10-CM | POA: Diagnosis not present

## 2020-10-06 DIAGNOSIS — D649 Anemia, unspecified: Secondary | ICD-10-CM | POA: Diagnosis not present

## 2020-10-06 DIAGNOSIS — N1832 Chronic kidney disease, stage 3b: Secondary | ICD-10-CM | POA: Diagnosis not present

## 2020-10-24 DIAGNOSIS — F0631 Mood disorder due to known physiological condition with depressive features: Secondary | ICD-10-CM | POA: Diagnosis not present

## 2020-10-24 DIAGNOSIS — F331 Major depressive disorder, recurrent, moderate: Secondary | ICD-10-CM | POA: Diagnosis not present

## 2020-10-24 DIAGNOSIS — G4701 Insomnia due to medical condition: Secondary | ICD-10-CM | POA: Diagnosis not present

## 2020-10-24 DIAGNOSIS — F0151 Vascular dementia with behavioral disturbance: Secondary | ICD-10-CM | POA: Diagnosis not present

## 2020-10-25 DIAGNOSIS — I10 Essential (primary) hypertension: Secondary | ICD-10-CM | POA: Diagnosis not present

## 2020-10-25 DIAGNOSIS — E119 Type 2 diabetes mellitus without complications: Secondary | ICD-10-CM | POA: Diagnosis not present

## 2020-10-25 DIAGNOSIS — I480 Paroxysmal atrial fibrillation: Secondary | ICD-10-CM | POA: Diagnosis not present

## 2020-10-25 DIAGNOSIS — N1832 Chronic kidney disease, stage 3b: Secondary | ICD-10-CM | POA: Diagnosis not present

## 2020-11-04 DIAGNOSIS — R059 Cough, unspecified: Secondary | ICD-10-CM | POA: Diagnosis not present

## 2020-11-04 DIAGNOSIS — Z03818 Encounter for observation for suspected exposure to other biological agents ruled out: Secondary | ICD-10-CM | POA: Diagnosis not present

## 2020-11-04 DIAGNOSIS — Z1152 Encounter for screening for COVID-19: Secondary | ICD-10-CM | POA: Diagnosis not present

## 2020-11-08 DIAGNOSIS — R895 Abnormal microbiological findings in specimens from other organs, systems and tissues: Secondary | ICD-10-CM | POA: Diagnosis not present

## 2020-11-08 DIAGNOSIS — Z20822 Contact with and (suspected) exposure to covid-19: Secondary | ICD-10-CM | POA: Diagnosis not present

## 2020-11-21 DIAGNOSIS — L84 Corns and callosities: Secondary | ICD-10-CM | POA: Diagnosis not present

## 2020-11-21 DIAGNOSIS — F331 Major depressive disorder, recurrent, moderate: Secondary | ICD-10-CM | POA: Diagnosis not present

## 2020-11-21 DIAGNOSIS — E1151 Type 2 diabetes mellitus with diabetic peripheral angiopathy without gangrene: Secondary | ICD-10-CM | POA: Diagnosis not present

## 2020-11-21 DIAGNOSIS — G4701 Insomnia due to medical condition: Secondary | ICD-10-CM | POA: Diagnosis not present

## 2020-11-21 DIAGNOSIS — F0151 Vascular dementia with behavioral disturbance: Secondary | ICD-10-CM | POA: Diagnosis not present

## 2020-11-22 ENCOUNTER — Encounter: Payer: Self-pay | Admitting: Neurology

## 2020-11-22 ENCOUNTER — Telehealth: Payer: Self-pay | Admitting: Neurology

## 2020-11-22 ENCOUNTER — Ambulatory Visit (INDEPENDENT_AMBULATORY_CARE_PROVIDER_SITE_OTHER): Payer: Medicare Other | Admitting: Neurology

## 2020-11-22 DIAGNOSIS — F0280 Dementia in other diseases classified elsewhere without behavioral disturbance: Secondary | ICD-10-CM

## 2020-11-22 DIAGNOSIS — H903 Sensorineural hearing loss, bilateral: Secondary | ICD-10-CM

## 2020-11-22 DIAGNOSIS — F0390 Unspecified dementia without behavioral disturbance: Secondary | ICD-10-CM

## 2020-11-22 DIAGNOSIS — H9190 Unspecified hearing loss, unspecified ear: Secondary | ICD-10-CM | POA: Insufficient documentation

## 2020-11-22 DIAGNOSIS — G301 Alzheimer's disease with late onset: Secondary | ICD-10-CM

## 2020-11-22 HISTORY — DX: Unspecified dementia, unspecified severity, without behavioral disturbance, psychotic disturbance, mood disturbance, and anxiety: F03.90

## 2020-11-22 MED ORDER — DONEPEZIL HCL 5 MG PO TABS: 5.0000 mg | ORAL_TABLET | Freq: Every day | ORAL | 3 refills | Status: DC

## 2020-11-22 MED ORDER — MEMANTINE HCL 10 MG PO TABS: 10.0000 mg | ORAL_TABLET | Freq: Two times a day (BID) | ORAL | Status: AC

## 2020-11-22 NOTE — Progress Notes (Signed)
Reason for visit: Memory disturbance, dementia  William Fisher is an 87 y.o. male  History of present illness:  William Fisher is an 87 year old right-handed white male with a history of a progressive dementing illness.  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  The patient has had a progressive memory disturbance over the last 6 to 7 years.  On his initial evaluation in January 2016, he was scoring 26 out of 30 on the Mini-Mental status examination, he is now scoring 15/30.  The patient currently resides at [facility] extended-care facility.  He is eating and drinking fairly well, he is maintaining his weight.  He sleeps well at night but sleeps also during the day.  He does not engage in social activities.  He keeps talking about wanting to leave and to drive his car.  He comes in today for further evaluation.  The patient is on Namenda taking 10 mg twice daily.  Past Medical History:  Diagnosis Date  . Coronary artery disease   . Dementia (HCC)   . Diabetes mellitus (HCC)   . Diverticulosis   . Herpes encephalitis   . HOH (hard of hearing)    hearing aides  . Hyperlipidemia   . Hyponatremia   . Lung nodule    Chronic  . Memory change 07/13/2014  . Orthostatic hypotension   . Palpitations   . Paroxysmal atrial fibrillation (HCC)   . Rhabdomyolysis    Mild  . SOB (shortness of breath)     Past Surgical History:  Procedure Laterality Date  . HERNIA REPAIR    . TONSILLECTOMY      Family History  Problem Relation Age of Onset  . Stroke Father   . Heart disease Brother   . Stroke Brother     Social history:  reports that he has never smoked. He has never used smokeless tobacco. He reports that he does not drink alcohol and does not use drugs.    Allergies  Allergen Reactions  . Codeine   . Guaifenesin   . Zinc Sulfate [Zinc]     Medications:  Prior to Admission medications   Medication Sig Start Date End Date  Taking? Authorizing Provider  acetaminophen (TYLENOL) 500 MG tablet Take 500-1,000 mg by mouth every 6 (six) hours as needed.   Yes [provider]  albuterol (VENTOLIN HFA) 108 (90 Base) MCG/ACT inhaler Inhale 2 puffs into the lungs once as needed for wheezing or shortness of breath (for Grade 3 or 4 hypersensitivity reaction). 02/28/20  Yes Sherryll Burger, Pratik D, DO  aspirin EC 81 MG tablet Take 81 mg by mouth daily. Swallow whole.   Yes [provider]  Cholecalciferol (VITAMIN D3) 50 MCG (2000 UT) TABS Take by mouth.   Yes [provider]  Cyanocobalamin (B-12) 2500 MCG TABS Take 1 tablet by mouth daily.   Yes [provider]  JANUMET 50-1000 MG per tablet Take 1 tablet by mouth 2 (two) times daily with a meal.  06/28/14  Yes [provider]  levothyroxine (SYNTHROID) 25 MCG tablet Take 25 mcg by mouth daily before breakfast.   Yes [provider]  LISINOPRIL PO Take 25 mg by mouth daily. 06/09/14  Yes [provider]  melatonin 5 MG TABS Take 5 mg by mouth.   Yes [provider]  memantine (NAMENDA) 5 MG tablet Take 1 tablet daily for one week, then take 1 tablet twice daily for one  week, then take 1 tablet in the morning and 2 in the evening for one week, then take 2 tablets twice daily Patient taking differently: 10 mg. 05/23/20  Yes York Spaniel, MD  Multiple Vitamin (MULTIVITAMIN) capsule Take 1 capsule by mouth daily.   Yes [provider]  pioglitazone (ACTOS) 15 MG tablet Take 15 mg by mouth daily. 11/30/19  Yes [provider]  sertraline (ZOLOFT) 50 MG tablet Take 50 mg by mouth daily.   Yes [provider]  atenolol (TENORMIN) 25 MG tablet TAKE 1- 2 TABLETS BY MOUTH EVERY DAY AS NEEDED Patient taking differently: Take 25 mg by mouth daily.    Wendall Stade, MD  feeding supplement, GLUCERNA SHAKE, (GLUCERNA SHAKE) LIQD Take 237 mLs by mouth 3 (three) times daily between meals.    [provider]  glipiZIDE (GLUCOTROL) 5 MG tablet Take 2.5 mg by mouth daily. 06/04/20   [provider]  Naproxen Sodium (ALL DAY PAIN RELIEF PO) Take 1 capsule by mouth daily as needed (pain).    [provider]    ROS:  Out of a complete 14 system review of symptoms, the patient complains only of the following symptoms, and all other reviewed systems are negative.  Memory problems Hard of hearing  Blood pressure 129/75, pulse 92, height 5\' 7"  (1.702 m), weight 156 lb (70.8 kg).  Physical Exam  General: The patient is alert and cooperative at the time of the examination.  The patient is very hard of hearing.  Skin: No significant peripheral edema is noted.   Neurologic Exam  Mental status: The patient is alert and oriented x 3 at the time of the examination. The Mini-Mental status examination done today shows a total score of 15/30.   Cranial nerves: Facial symmetry is present. Speech is normal, no aphasia or dysarthria is noted. Extraocular movements are full. Visual fields are full.  Motor: The patient has good strength in all 4 extremities.  Sensory examination: Soft touch sensation is symmetric on the face, arms, and legs.  Coordination: The patient has good finger-nose-finger and heel-to-shin bilaterally.  Mild apraxia with use of the lower extremities is noted.  Gait and station: The patient has a normal gait for age.  Romberg is negative.  Reflexes: Deep tendon reflexes are symmetric.   Assessment/Plan:  1.  Progressive memory disturbance, dementia  We have recommended today that he not operate a motor vehicle, the patient does not seem to accept this.  The patient will remain on Namenda, we will add low-dose Aricept to this taking 5 mg at night.  A prescription was sent in.  He will follow-up in 6 months, he may see Dr. in the future.  Marjory Lies MD 11/22/2020 10:10 AM  [facility] [address] [place], [place]  Phone [phone] Fax [phone]

## 2020-11-22 NOTE — Patient Instructions (Addendum)
No further driving is recommended at this time.  We will start aricept at night for the memory.  Begin Aricept (donepezil) at 5 mg at night for one month. If this medication is well-tolerated, please call our office and we will call in a prescription for the 10 mg tablets. Look out for side effects that may include nausea, diarrhea, weight loss, or stomach cramps. This medication will also cause a runny nose, therefore there is no need for allergy medications for this purpose.

## 2020-11-22 NOTE — Telephone Encounter (Signed)
I called the son.  The patient will be coming in today, the son has COVID and cannot be here.  The patient currently is not driving, but he wants to drive.  The last visit 6 months ago revealed a Mini-Mental Status Examination of 17/30.  The patient apparently had some issues with mild confusion in the afternoons or evenings, no overt hallucinations.  It probably is not reasonable for him to be driving at this point.  We will recheck Mini-Mental Status Examination today when he is seen in the office.  We will discuss the driving issue with the patient.

## 2020-11-22 NOTE — Telephone Encounter (Signed)
Pt's son Wilmar Prabhakar on DPR called stating that he is needing to speak to the provider or RN regarding the pt before he is seen at his appt. today at 10am. [name] is needing to discuss the pt driving and other situations with the provider. Please advise.

## 2020-11-28 DIAGNOSIS — F331 Major depressive disorder, recurrent, moderate: Secondary | ICD-10-CM | POA: Diagnosis not present

## 2020-11-29 DIAGNOSIS — F331 Major depressive disorder, recurrent, moderate: Secondary | ICD-10-CM | POA: Diagnosis not present

## 2020-11-29 DIAGNOSIS — R635 Abnormal weight gain: Secondary | ICD-10-CM | POA: Diagnosis not present

## 2020-11-29 DIAGNOSIS — E119 Type 2 diabetes mellitus without complications: Secondary | ICD-10-CM | POA: Diagnosis not present

## 2020-11-29 DIAGNOSIS — F0151 Vascular dementia with behavioral disturbance: Secondary | ICD-10-CM | POA: Diagnosis not present

## 2020-12-08 DIAGNOSIS — E559 Vitamin D deficiency, unspecified: Secondary | ICD-10-CM | POA: Diagnosis not present

## 2020-12-08 DIAGNOSIS — N1832 Chronic kidney disease, stage 3b: Secondary | ICD-10-CM | POA: Diagnosis not present

## 2020-12-08 DIAGNOSIS — E119 Type 2 diabetes mellitus without complications: Secondary | ICD-10-CM | POA: Diagnosis not present

## 2020-12-27 DIAGNOSIS — F331 Major depressive disorder, recurrent, moderate: Secondary | ICD-10-CM | POA: Diagnosis not present

## 2020-12-27 DIAGNOSIS — F0151 Vascular dementia with behavioral disturbance: Secondary | ICD-10-CM | POA: Diagnosis not present

## 2020-12-27 DIAGNOSIS — G4701 Insomnia due to medical condition: Secondary | ICD-10-CM | POA: Diagnosis not present

## 2021-01-03 DIAGNOSIS — N1832 Chronic kidney disease, stage 3b: Secondary | ICD-10-CM | POA: Diagnosis not present

## 2021-01-03 DIAGNOSIS — I10 Essential (primary) hypertension: Secondary | ICD-10-CM | POA: Diagnosis not present

## 2021-01-03 DIAGNOSIS — I480 Paroxysmal atrial fibrillation: Secondary | ICD-10-CM | POA: Diagnosis not present

## 2021-01-03 DIAGNOSIS — E875 Hyperkalemia: Secondary | ICD-10-CM | POA: Diagnosis not present

## 2021-01-12 DIAGNOSIS — E119 Type 2 diabetes mellitus without complications: Secondary | ICD-10-CM | POA: Diagnosis not present

## 2021-01-12 DIAGNOSIS — N1832 Chronic kidney disease, stage 3b: Secondary | ICD-10-CM | POA: Diagnosis not present

## 2021-01-16 DIAGNOSIS — G4701 Insomnia due to medical condition: Secondary | ICD-10-CM | POA: Diagnosis not present

## 2021-01-16 DIAGNOSIS — F331 Major depressive disorder, recurrent, moderate: Secondary | ICD-10-CM | POA: Diagnosis not present

## 2021-01-16 DIAGNOSIS — F0151 Vascular dementia with behavioral disturbance: Secondary | ICD-10-CM | POA: Diagnosis not present

## 2021-02-13 DIAGNOSIS — E1151 Type 2 diabetes mellitus with diabetic peripheral angiopathy without gangrene: Secondary | ICD-10-CM | POA: Diagnosis not present

## 2021-02-13 DIAGNOSIS — L84 Corns and callosities: Secondary | ICD-10-CM | POA: Diagnosis not present

## 2021-02-20 DIAGNOSIS — F331 Major depressive disorder, recurrent, moderate: Secondary | ICD-10-CM | POA: Diagnosis not present

## 2021-02-20 DIAGNOSIS — F0151 Vascular dementia with behavioral disturbance: Secondary | ICD-10-CM | POA: Diagnosis not present

## 2021-02-20 DIAGNOSIS — G4701 Insomnia due to medical condition: Secondary | ICD-10-CM | POA: Diagnosis not present

## 2021-02-28 DIAGNOSIS — E875 Hyperkalemia: Secondary | ICD-10-CM | POA: Diagnosis not present

## 2021-02-28 DIAGNOSIS — I130 Hypertensive heart and chronic kidney disease with heart failure and stage 1 through stage 4 chronic kidney disease, or unspecified chronic kidney disease: Secondary | ICD-10-CM | POA: Diagnosis not present

## 2021-02-28 DIAGNOSIS — N1832 Chronic kidney disease, stage 3b: Secondary | ICD-10-CM | POA: Diagnosis not present

## 2021-02-28 DIAGNOSIS — E1121 Type 2 diabetes mellitus with diabetic nephropathy: Secondary | ICD-10-CM | POA: Diagnosis not present

## 2021-03-07 DIAGNOSIS — H25813 Combined forms of age-related cataract, bilateral: Secondary | ICD-10-CM | POA: Diagnosis not present

## 2021-03-20 DIAGNOSIS — F0151 Vascular dementia with behavioral disturbance: Secondary | ICD-10-CM | POA: Diagnosis not present

## 2021-03-20 DIAGNOSIS — G4701 Insomnia due to medical condition: Secondary | ICD-10-CM | POA: Diagnosis not present

## 2021-03-20 DIAGNOSIS — F331 Major depressive disorder, recurrent, moderate: Secondary | ICD-10-CM | POA: Diagnosis not present

## 2021-03-26 DIAGNOSIS — F331 Major depressive disorder, recurrent, moderate: Secondary | ICD-10-CM | POA: Diagnosis not present

## 2021-04-19 DIAGNOSIS — G4701 Insomnia due to medical condition: Secondary | ICD-10-CM | POA: Diagnosis not present

## 2021-04-19 DIAGNOSIS — F331 Major depressive disorder, recurrent, moderate: Secondary | ICD-10-CM | POA: Diagnosis not present

## 2021-04-19 DIAGNOSIS — F01B3 Vascular dementia, moderate, with mood disturbance: Secondary | ICD-10-CM | POA: Diagnosis not present

## 2021-05-02 DIAGNOSIS — E1121 Type 2 diabetes mellitus with diabetic nephropathy: Secondary | ICD-10-CM | POA: Diagnosis not present

## 2021-05-02 DIAGNOSIS — I1310 Hypertensive heart and chronic kidney disease without heart failure, with stage 1 through stage 4 chronic kidney disease, or unspecified chronic kidney disease: Secondary | ICD-10-CM | POA: Diagnosis not present

## 2021-05-02 DIAGNOSIS — N1832 Chronic kidney disease, stage 3b: Secondary | ICD-10-CM | POA: Diagnosis not present

## 2021-05-02 DIAGNOSIS — I480 Paroxysmal atrial fibrillation: Secondary | ICD-10-CM | POA: Diagnosis not present

## 2021-05-08 DIAGNOSIS — L84 Corns and callosities: Secondary | ICD-10-CM | POA: Diagnosis not present

## 2021-05-08 DIAGNOSIS — E1151 Type 2 diabetes mellitus with diabetic peripheral angiopathy without gangrene: Secondary | ICD-10-CM | POA: Diagnosis not present

## 2021-05-11 DIAGNOSIS — F331 Major depressive disorder, recurrent, moderate: Secondary | ICD-10-CM | POA: Diagnosis not present

## 2021-05-29 DIAGNOSIS — F0150 Vascular dementia without behavioral disturbance: Secondary | ICD-10-CM | POA: Diagnosis not present

## 2021-05-29 DIAGNOSIS — G4701 Insomnia due to medical condition: Secondary | ICD-10-CM | POA: Diagnosis not present

## 2021-05-29 DIAGNOSIS — F331 Major depressive disorder, recurrent, moderate: Secondary | ICD-10-CM | POA: Diagnosis not present

## 2021-05-31 DIAGNOSIS — I1310 Hypertensive heart and chronic kidney disease without heart failure, with stage 1 through stage 4 chronic kidney disease, or unspecified chronic kidney disease: Secondary | ICD-10-CM | POA: Diagnosis not present

## 2021-05-31 DIAGNOSIS — F0150 Vascular dementia without behavioral disturbance: Secondary | ICD-10-CM | POA: Diagnosis not present

## 2021-05-31 DIAGNOSIS — I480 Paroxysmal atrial fibrillation: Secondary | ICD-10-CM | POA: Diagnosis not present

## 2021-05-31 DIAGNOSIS — E1121 Type 2 diabetes mellitus with diabetic nephropathy: Secondary | ICD-10-CM | POA: Diagnosis not present

## 2021-06-08 DIAGNOSIS — E039 Hypothyroidism, unspecified: Secondary | ICD-10-CM | POA: Diagnosis not present

## 2021-06-08 DIAGNOSIS — D511 Vitamin B12 deficiency anemia due to selective vitamin B12 malabsorption with proteinuria: Secondary | ICD-10-CM | POA: Diagnosis not present

## 2021-06-08 DIAGNOSIS — E785 Hyperlipidemia, unspecified: Secondary | ICD-10-CM | POA: Diagnosis not present

## 2021-06-08 DIAGNOSIS — E119 Type 2 diabetes mellitus without complications: Secondary | ICD-10-CM | POA: Diagnosis not present

## 2021-06-08 DIAGNOSIS — I1310 Hypertensive heart and chronic kidney disease without heart failure, with stage 1 through stage 4 chronic kidney disease, or unspecified chronic kidney disease: Secondary | ICD-10-CM | POA: Diagnosis not present

## 2021-06-13 ENCOUNTER — Encounter: Payer: Self-pay | Admitting: Neurology

## 2021-06-13 ENCOUNTER — Ambulatory Visit: Payer: Medicare Other | Admitting: Neurology

## 2021-06-13 VITALS — BP 130/74 | HR 93 | Ht 67.0 in | Wt 161.0 lb

## 2021-06-13 DIAGNOSIS — F02B0 Dementia in other diseases classified elsewhere, moderate, without behavioral disturbance, psychotic disturbance, mood disturbance, and anxiety: Secondary | ICD-10-CM

## 2021-06-13 DIAGNOSIS — G301 Alzheimer's disease with late onset: Secondary | ICD-10-CM | POA: Diagnosis not present

## 2021-06-13 NOTE — Patient Instructions (Addendum)
Continue the Aricept at current dosing, continue the Namenda 10 mg twice daily Recommend trying some social activity at the facility Continue restorative physical therapy  Brain stimulating exercises are good for memory health (reading, playing a game, doing a puzzle, etc) See you back in 6 months or sooner if needed

## 2021-06-13 NOTE — Progress Notes (Signed)
PATIENT: William Fisher DOB: [birth year]  REASON FOR VISIT: Follow up HISTORY FROM: Patient PRIMARY NEUROLOGIST: Dr. Marjory Lies now that Dr. Anne Hahn is retired   HISTORY OF PRESENT ILLNESS: Today 06/13/21 William Fisher here today for follow-up for progressive memory disturbance. In May 2022, MMSE 15/30.Lives at [facility] memory unit. Remains on Namenda 10 mg twice daily, Aricept 5 mg at bedtime. No change since Aricept is added, but doesn't seem any worse. He is very hard of hearing. Most of the time is in good spirits, 5% of the time, can be moody. His son brings pictures to visit, he has a hard time recalling names. Sleeping well at night, no falls. Limited social interaction, doesn't participate in activities. MMSE is 13/30 today. Here today with his son, William Fisher. Have been through 3 pairs of high end hearing aids, his current hearing aids don't work great. 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 His family is considering bringing him home with 24 hr supervision or changing facilities. He doesn't drive.  HISTORY 11/22/2020 Dr. Anne Hahn: William Fisher is an 87 year old right-handed white male with a history of a progressive dementing illness.  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  The patient has had a progressive memory disturbance over the last 6 to 7 years.  On his initial evaluation in January 2016, he was scoring 26 out of 30 on the Mini-Mental status examination, he is now scoring 15/30.  The patient currently resides at [facility] extended-care facility.  He is eating and drinking fairly well, he is maintaining his weight.  He sleeps well at night but sleeps also during the day.  He does not engage in social activities.  He keeps talking about wanting to leave and to drive his car.  He comes in today for further evaluation.  The patient is on Namenda taking 10 mg twice daily.   REVIEW OF SYSTEMS: Out of a complete 14 system review of symptoms,  the patient complains only of the following symptoms, and all other reviewed systems are negative.  Memory loss  ALLERGIES: Allergies  Allergen Reactions   Codeine    Guaifenesin    Zinc Sulfate [Zinc]     HOME MEDICATIONS: Outpatient Medications Prior to Visit  Medication Sig Dispense Refill   acetaminophen (TYLENOL) 500 MG tablet Take 500-1,000 mg by mouth every 6 (six) hours as needed.     aspirin EC 81 MG tablet Take 81 mg by mouth daily. Swallow whole.     Cholecalciferol (VITAMIN D3) 50 MCG (2000 UT) TABS Take by mouth.     Cyanocobalamin (B-12) 2500 MCG TABS Take 1 tablet by mouth daily.     donepezil (ARICEPT) 5 MG tablet Take 1 tablet (5 mg total) by mouth at bedtime. 30 tablet 3   JANUMET 50-1000 MG per tablet Take 1 tablet by mouth 2 (two) times daily with a meal.   5   levothyroxine (SYNTHROID) 25 MCG tablet Take 25 mcg by mouth daily before breakfast.     melatonin 5 MG TABS Take 5 mg by mouth.     memantine (NAMENDA) 10 MG tablet Take 1 tablet (10 mg total) by mouth 2 (two) times daily.     Multiple Vitamin (MULTIVITAMIN) capsule Take 1 capsule by mouth daily.     pioglitazone (ACTOS) 15 MG tablet Take 15 mg by mouth daily.     sertraline (ZOLOFT) 50 MG tablet Take 50 mg by mouth daily.  albuterol (VENTOLIN HFA) 108 (90 Base) MCG/ACT inhaler Inhale 2 puffs into the lungs once as needed for wheezing or shortness of breath (for Grade 3 or 4 hypersensitivity reaction). 8 g 0   atenolol (TENORMIN) 25 MG tablet TAKE 1- 2 TABLETS BY MOUTH EVERY DAY AS NEEDED (Patient taking differently: Take 25 mg by mouth daily.) 30 tablet 11   LISINOPRIL PO Take 25 mg by mouth daily.  5   Naproxen Sodium (ALL DAY PAIN RELIEF PO) Take 1 capsule by mouth daily as needed (pain).     No facility-administered medications prior to visit.    PAST MEDICAL HISTORY: Past Medical History:  Diagnosis Date   Coronary artery disease    Dementia (HCC)    Dementia (HCC) 11/22/2020   Diabetes  mellitus (HCC)    Diverticulosis    Herpes encephalitis    HOH (hard of hearing)    hearing aides   Hyperlipidemia    Hyponatremia    Lung nodule    Chronic   Memory change 07/13/2014   Orthostatic hypotension    Palpitations    Paroxysmal atrial fibrillation (HCC)    Rhabdomyolysis    Mild   SOB (shortness of breath)     PAST SURGICAL HISTORY: Past Surgical History:  Procedure Laterality Date   HERNIA REPAIR     TONSILLECTOMY      FAMILY HISTORY: Family History  Problem Relation Age of Onset   Stroke Father    Heart disease Brother    Stroke Brother     SOCIAL HISTORY: Social History   Socioeconomic History   Marital status: Widowed    Spouse name: Not on file   Number of children: 4   Years of education: HS   Highest education level: Not on file  Occupational History   Occupation: Retired  Tobacco Use   Smoking status: Never   Smokeless tobacco: Never  Substance and Sexual Activity   Alcohol use: No   Drug use: No   Sexual activity: Not Currently  Other Topics Concern   Not on file  Social History Narrative   Patient is right handed.   Patient drinks some caffeine daily.   Patient lives at [facility], [place]   Social Determinants of Health   Financial Resource Strain: Not on file  Food Insecurity: Not on file  Transportation Needs: Not on file  Physical Activity: Not on file  Stress: Not on file  Social Connections: Not on file  Intimate Partner Violence: Not on file   PHYSICAL EXAM  Vitals:   06/13/21 1002  BP: 130/74  Pulse: 93  Weight: 161 lb (73 kg)  Height: 5\' 7"  (1.702 m)   Body mass index is 25.22 kg/m.  Generalized: Well developed, in no acute distress  MMSE - Mini Mental State Exam 06/13/2021 11/22/2020 05/23/2020  Orientation to time 1 2 2   Orientation to Place 4 2 4   Registration 0 3 3  Attention/ Calculation 0 0 1  Recall 0 0 0  Language- name 2 objects 2 2 2   Language- repeat 1 1 0  Language- follow  3 step command 3 2 3   Language- read & follow direction 1 1 1   Write a sentence 1 1 1   Copy design 0 1 0  Total score 13 15 17     Neurological examination  Mentation: Alert, very hard of hearing, pleasant, history is provided by his son. Follows all commands speech and language fluent Cranial nerve II-XII: Pupils were equal round  reactive to light. Extraocular movements were full, visual field were full on confrontational test. Facial sensation and strength were normal. Head turning and shoulder shrug  were normal and symmetric. Motor: Good strength all extremities Sensory: Sensory testing is intact to soft touch on all 4 extremities. No evidence of extinction is noted.  Coordination: Cerebellar testing reveals good finger-nose-finger and heel-to-shin bilaterally.  Gait and station: Gait is normal, slightly wide-based, but independent Reflexes: Deep tendon reflexes are symmetric  DIAGNOSTIC DATA (LABS, IMAGING, TESTING) - I reviewed patient records, labs, notes, testing and imaging myself where available.  Lab Results  Component Value Date   WBC 8.1 02/28/2020   HGB 11.2 (L) 02/28/2020   HCT 34.1 (L) 02/28/2020   MCV 92.7 02/28/2020   PLT 287 02/28/2020      Component Value Date/Time   NA 135 02/28/2020 0645   K 3.4 (L) 02/28/2020 0645   CL 101 02/28/2020 0645   CO2 23 02/28/2020 0645   GLUCOSE 107 (H) 02/28/2020 0645   BUN 17 02/28/2020 0645   CREATININE 0.75 02/28/2020 0645   CALCIUM 8.3 (L) 02/28/2020 0645   PROT 6.8 02/28/2020 0645   ALBUMIN 3.1 (L) 02/28/2020 0645   AST 80 (H) 02/28/2020 0645   ALT 30 02/28/2020 0645   ALKPHOS 46 02/28/2020 0645   BILITOT 1.0 02/28/2020 0645   GFRNONAA >60 02/28/2020 0645   GFRAA >60 02/28/2020 0645   Lab Results  Component Value Date   CHOL  02/28/2010    114        ATP III CLASSIFICATION:  <200     mg/dL   Desirable  161-096  mg/dL   Borderline High  >=045    mg/dL   High          HDL 20 (L) 02/28/2010   LDLCALC   02/28/2010    61        Total Cholesterol/HDL:CHD Risk Coronary Heart Disease Risk Table                     Men   Women  1/2 Average Risk   3.4   3.3  Average Risk       5.0   4.4  2 X Average Risk   9.6   7.1  3 X Average Risk  23.4   11.0        Use the calculated Patient Ratio above and the CHD Risk Table to determine the patient's CHD Risk.        ATP III CLASSIFICATION (LDL):  <100     mg/dL   Optimal  409-811  mg/dL   Near or Above                    Optimal  130-159  mg/dL   Borderline  914-782  mg/dL   High  >956     mg/dL   Very High   TRIG 85 21/30/8657   CHOLHDL 5.7 02/28/2010   Lab Results  Component Value Date   HGBA1C 6.3 (H) 02/26/2020   Lab Results  Component Value Date   VITAMINB12 6,781 (H) 02/26/2020   Lab Results  Component Value Date   TSH 2.312 02/26/2020   ASSESSMENT AND PLAN 87 y.o. year old male  has a past medical history of Coronary artery disease, Dementia (HCC), Dementia (HCC) (11/22/2020), Diabetes mellitus (HCC), Diverticulosis, Herpes encephalitis, HOH (hard of hearing), Hyperlipidemia, Hyponatremia, Lung nodule, Memory change (07/13/2014), Orthostatic hypotension, Palpitations, Paroxysmal atrial fibrillation (  HCC), Rhabdomyolysis, and SOB (shortness of breath). here with:  Progressive memory disturbance, dementia   -Have decided to keep Aricept at current dosing 5 mg at bedtime, Namenda 10 mg twice daily -MMSE 13/30 today, some of that seems impacted by his poor hearing -Have discussed the importance of social interaction, participation in brain stimulating exercises at the facility -I will see him back in 6 months or sooner if needed, at that time we can revisit increasing Aricept, otherwise can continue to be followed by his primary care doctor -Since Dr. Anne Hahn has retired, he will now be followed by Dr. Marjory Lies  I spent 32 minutes of face-to-face and non-face-to-face time with patient.  This included previsit chart review, lab  review, study review, order entry, electronic health record documentation, discussing Aricept/namenda, memory testing, and follow up.   Margie Ege, AGNP-C, DNP 06/13/2021, 10:19 AM [facility] [address], [address] [place], [place] [address] [phone]

## 2021-06-28 DIAGNOSIS — G4701 Insomnia due to medical condition: Secondary | ICD-10-CM | POA: Diagnosis not present

## 2021-06-28 DIAGNOSIS — F01B3 Vascular dementia, moderate, with mood disturbance: Secondary | ICD-10-CM | POA: Diagnosis not present

## 2021-06-28 DIAGNOSIS — F331 Major depressive disorder, recurrent, moderate: Secondary | ICD-10-CM | POA: Diagnosis not present

## 2021-07-16 DIAGNOSIS — F331 Major depressive disorder, recurrent, moderate: Secondary | ICD-10-CM | POA: Diagnosis not present

## 2021-07-24 DIAGNOSIS — F331 Major depressive disorder, recurrent, moderate: Secondary | ICD-10-CM | POA: Diagnosis not present

## 2021-07-24 DIAGNOSIS — F01B3 Vascular dementia, moderate, with mood disturbance: Secondary | ICD-10-CM | POA: Diagnosis not present

## 2021-07-24 DIAGNOSIS — G4701 Insomnia due to medical condition: Secondary | ICD-10-CM | POA: Diagnosis not present

## 2021-07-26 DIAGNOSIS — E1121 Type 2 diabetes mellitus with diabetic nephropathy: Secondary | ICD-10-CM | POA: Diagnosis not present

## 2021-07-26 DIAGNOSIS — I480 Paroxysmal atrial fibrillation: Secondary | ICD-10-CM | POA: Diagnosis not present

## 2021-07-26 DIAGNOSIS — N1832 Chronic kidney disease, stage 3b: Secondary | ICD-10-CM | POA: Diagnosis not present

## 2021-07-26 DIAGNOSIS — N390 Urinary tract infection, site not specified: Secondary | ICD-10-CM | POA: Diagnosis not present

## 2021-07-26 DIAGNOSIS — F01B3 Vascular dementia, moderate, with mood disturbance: Secondary | ICD-10-CM | POA: Diagnosis not present

## 2021-08-20 DIAGNOSIS — G4701 Insomnia due to medical condition: Secondary | ICD-10-CM | POA: Diagnosis not present

## 2021-08-20 DIAGNOSIS — F331 Major depressive disorder, recurrent, moderate: Secondary | ICD-10-CM | POA: Diagnosis not present

## 2021-08-20 DIAGNOSIS — F0150 Vascular dementia without behavioral disturbance: Secondary | ICD-10-CM | POA: Diagnosis not present

## 2021-08-23 DIAGNOSIS — N1832 Chronic kidney disease, stage 3b: Secondary | ICD-10-CM | POA: Diagnosis not present

## 2021-08-23 DIAGNOSIS — I119 Hypertensive heart disease without heart failure: Secondary | ICD-10-CM | POA: Diagnosis not present

## 2021-08-23 DIAGNOSIS — E039 Hypothyroidism, unspecified: Secondary | ICD-10-CM | POA: Diagnosis not present

## 2021-08-23 DIAGNOSIS — E1121 Type 2 diabetes mellitus with diabetic nephropathy: Secondary | ICD-10-CM | POA: Diagnosis not present

## 2021-08-31 DIAGNOSIS — Z79899 Other long term (current) drug therapy: Secondary | ICD-10-CM | POA: Diagnosis not present

## 2021-09-05 DIAGNOSIS — H524 Presbyopia: Secondary | ICD-10-CM | POA: Diagnosis not present

## 2021-09-18 IMAGING — CT CT CHEST W/O CM
2 of 4 series · 15 of 36 positions shown, 18 images · non-contrast
Comparison: 09/10/2007 from [HOSPITAL]

CLINICAL DATA: Dyspnea.  Recent [id number] virus infection.

EXAM:
CT CHEST WITHOUT CONTRAST
TECHNIQUE: Multidetector CT imaging of the chest was performed following the
standard protocol without IV contrast.

[Series 2: routine chest without · axial · non-contrast · 0.71mm/px · z∈[+1431,+1673]mm · 12 of 145 slices shown, 15 images]
[im 12/145  mediastinal]
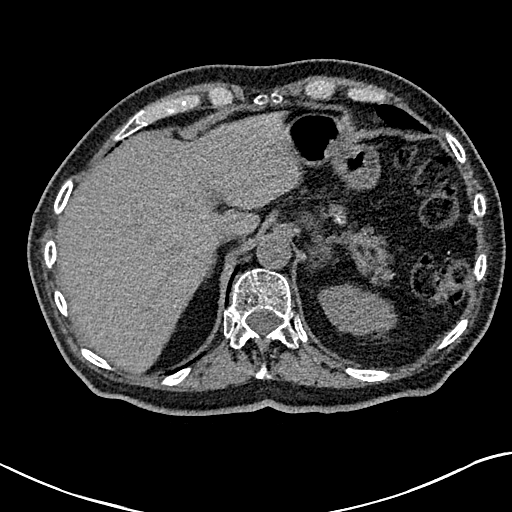
[im 12/145  lung]
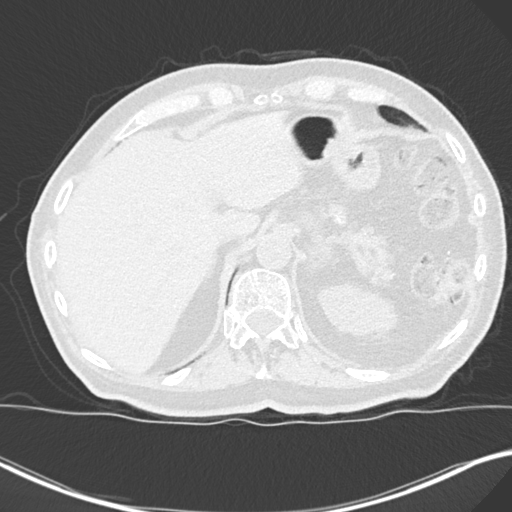
[im 23/145  lung]
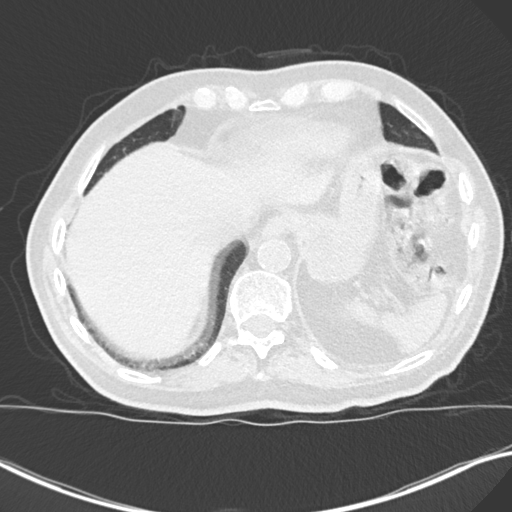
[im 34/145  lung]
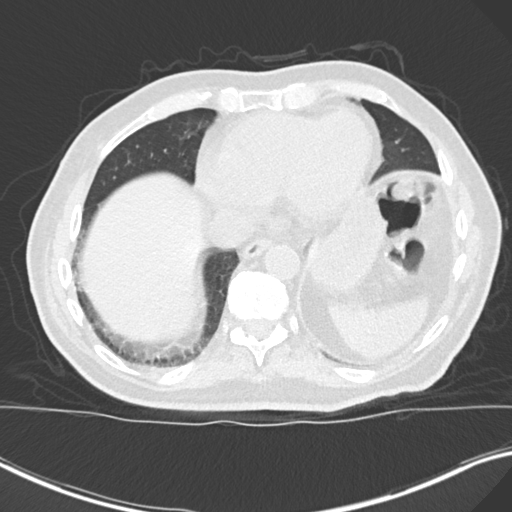
[im 45/145  lung]
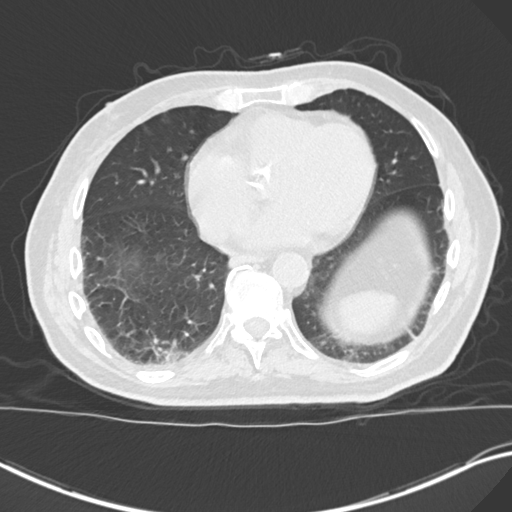
[im 56/145  mediastinal]
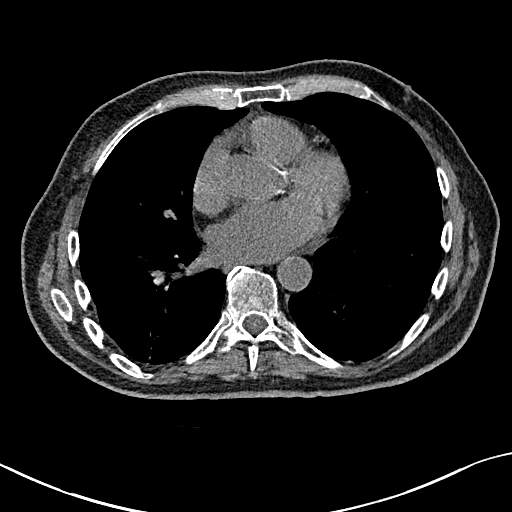
[im 56/145  lung]
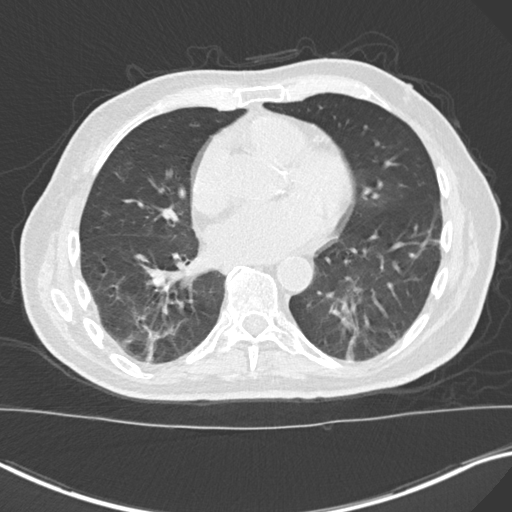
[im 67/145  lung]
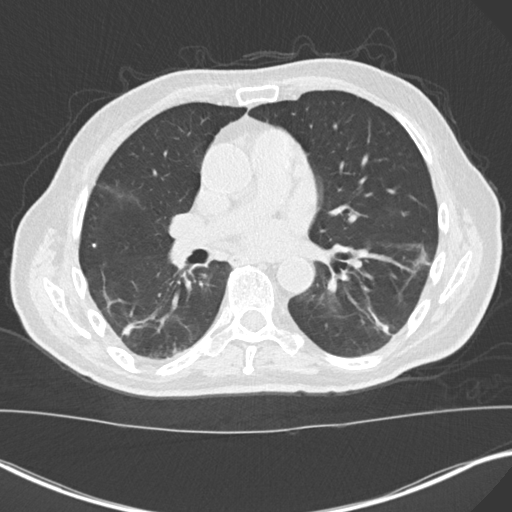
[im 78/145  lung]
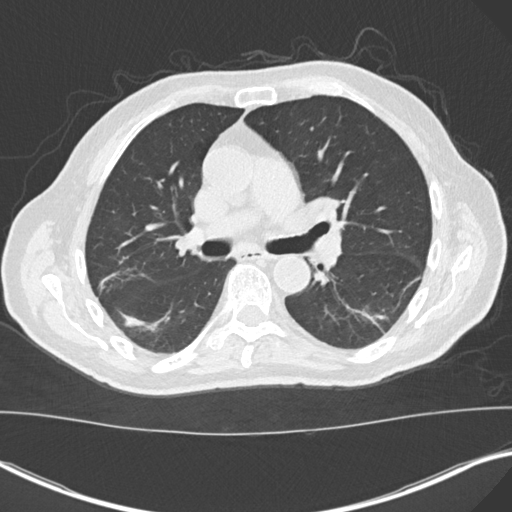
[im 89/145  lung]
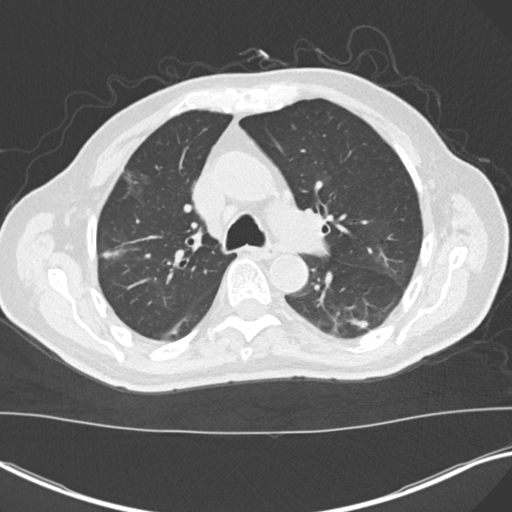
[im 100/145  mediastinal]
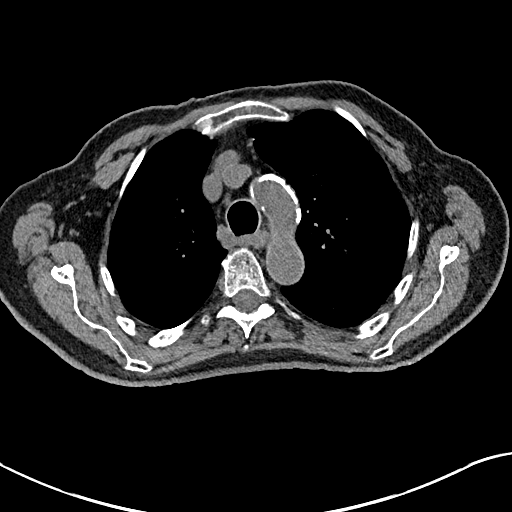
[im 100/145  lung]
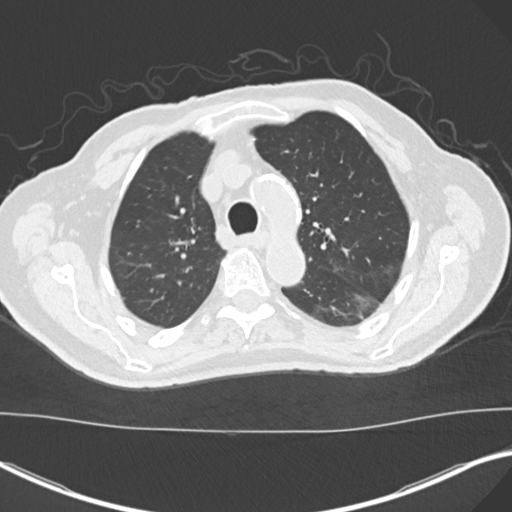
[im 111/145  lung]
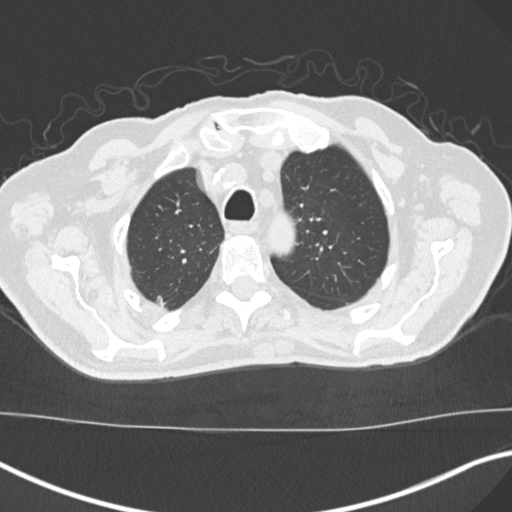
[im 122/145  lung]
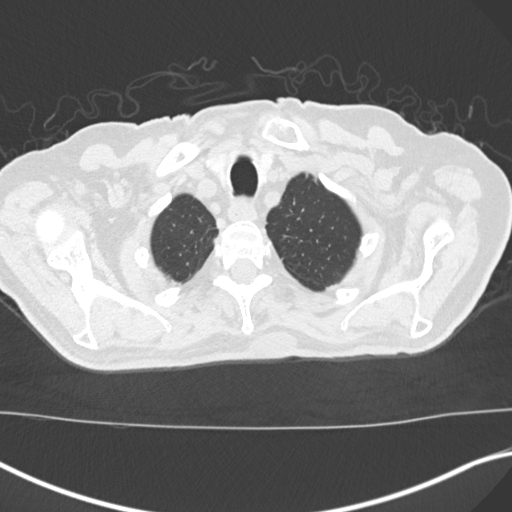
[im 133/145  lung]
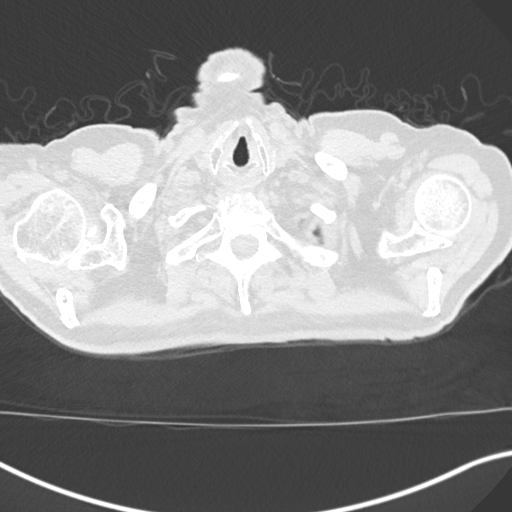

[Series 5: coronal · coronal · 0.59mm/px · 3 of 133 slices shown]
[im 27/133  lung]
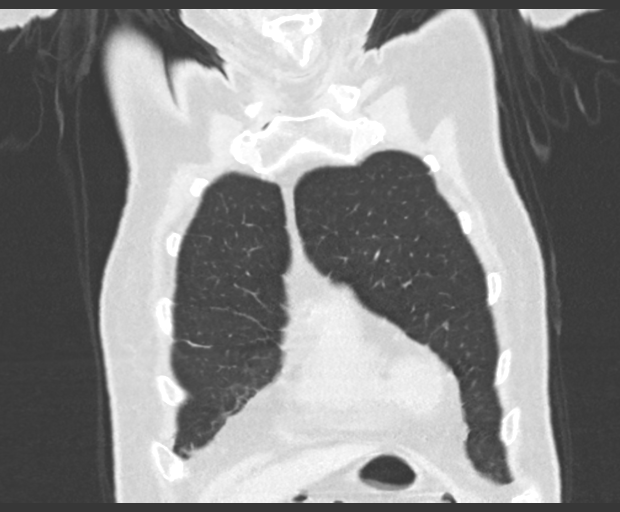
[im 53/133  lung]
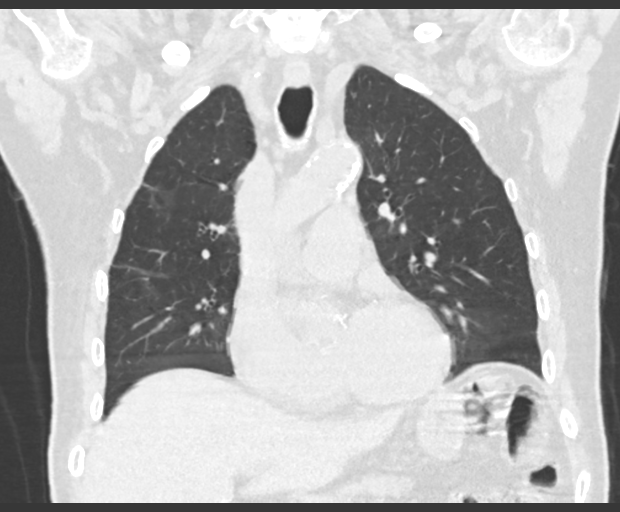
[im 80/133  lung]
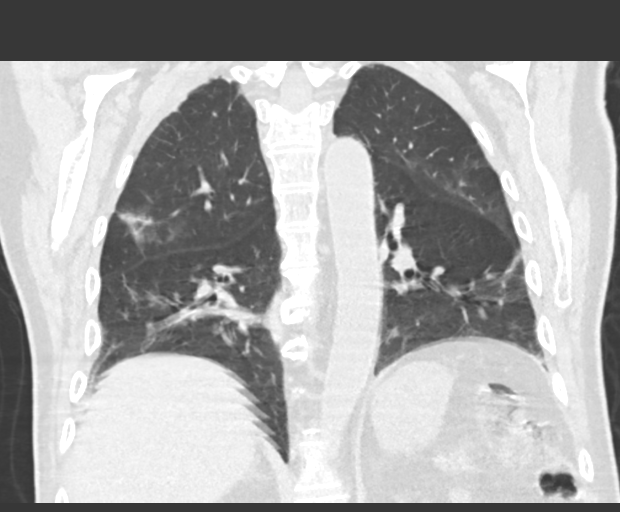

[15 of 36 positions shown; findings below may reference images not displayed]

FINDINGS: Cardiovascular: No acute findings. Aortic and coronary
atherosclerotic calcification noted.

Mediastinum/Nodes: No masses or pathologically enlarged lymph nodes
identified on this unenhanced exam.

Lungs/Pleura: Linear and bandlike opacities are seen in both lungs
with peripheral and lower lobe predominance, consistent with
scarring. No evidence of infiltrate or pleural effusion. No
suspicious nodules or masses identified.

Upper Abdomen:  Unremarkable.

Musculoskeletal:  No suspicious bone lesions.
IMPRESSION: Bilateral pulmonary parenchymal scarring.  No active lung disease.

Aortic Atherosclerosis ([id number]).

## 2021-09-20 DIAGNOSIS — E039 Hypothyroidism, unspecified: Secondary | ICD-10-CM | POA: Diagnosis not present

## 2021-09-20 DIAGNOSIS — D518 Other vitamin B12 deficiency anemias: Secondary | ICD-10-CM | POA: Diagnosis not present

## 2021-09-20 DIAGNOSIS — I1310 Hypertensive heart and chronic kidney disease without heart failure, with stage 1 through stage 4 chronic kidney disease, or unspecified chronic kidney disease: Secondary | ICD-10-CM | POA: Diagnosis not present

## 2021-09-20 DIAGNOSIS — I119 Hypertensive heart disease without heart failure: Secondary | ICD-10-CM | POA: Diagnosis not present

## 2021-09-25 DIAGNOSIS — F0152 Vascular dementia, unspecified severity, with psychotic disturbance: Secondary | ICD-10-CM | POA: Diagnosis not present

## 2021-09-25 DIAGNOSIS — F331 Major depressive disorder, recurrent, moderate: Secondary | ICD-10-CM | POA: Diagnosis not present

## 2021-09-25 DIAGNOSIS — G4701 Insomnia due to medical condition: Secondary | ICD-10-CM | POA: Diagnosis not present

## 2021-09-28 DIAGNOSIS — N1832 Chronic kidney disease, stage 3b: Secondary | ICD-10-CM | POA: Diagnosis not present

## 2021-09-28 DIAGNOSIS — E1121 Type 2 diabetes mellitus with diabetic nephropathy: Secondary | ICD-10-CM | POA: Diagnosis not present

## 2021-09-28 DIAGNOSIS — F0152 Vascular dementia, unspecified severity, with psychotic disturbance: Secondary | ICD-10-CM | POA: Diagnosis not present

## 2021-09-28 DIAGNOSIS — E039 Hypothyroidism, unspecified: Secondary | ICD-10-CM | POA: Diagnosis not present

## 2021-09-28 DIAGNOSIS — D519 Vitamin B12 deficiency anemia, unspecified: Secondary | ICD-10-CM | POA: Diagnosis not present

## 2021-10-09 DIAGNOSIS — E1151 Type 2 diabetes mellitus with diabetic peripheral angiopathy without gangrene: Secondary | ICD-10-CM | POA: Diagnosis not present

## 2021-10-09 DIAGNOSIS — L84 Corns and callosities: Secondary | ICD-10-CM | POA: Diagnosis not present

## 2021-10-18 DIAGNOSIS — D518 Other vitamin B12 deficiency anemias: Secondary | ICD-10-CM | POA: Diagnosis not present

## 2021-10-18 DIAGNOSIS — E039 Hypothyroidism, unspecified: Secondary | ICD-10-CM | POA: Diagnosis not present

## 2021-10-18 DIAGNOSIS — F0152 Vascular dementia, unspecified severity, with psychotic disturbance: Secondary | ICD-10-CM | POA: Diagnosis not present

## 2021-10-18 DIAGNOSIS — I119 Hypertensive heart disease without heart failure: Secondary | ICD-10-CM | POA: Diagnosis not present

## 2021-10-22 DIAGNOSIS — E038 Other specified hypothyroidism: Secondary | ICD-10-CM | POA: Diagnosis not present

## 2021-10-22 DIAGNOSIS — E1122 Type 2 diabetes mellitus with diabetic chronic kidney disease: Secondary | ICD-10-CM | POA: Diagnosis not present

## 2021-10-22 DIAGNOSIS — F331 Major depressive disorder, recurrent, moderate: Secondary | ICD-10-CM | POA: Diagnosis not present

## 2021-10-22 DIAGNOSIS — N1832 Chronic kidney disease, stage 3b: Secondary | ICD-10-CM | POA: Diagnosis not present

## 2021-10-26 DIAGNOSIS — Z79899 Other long term (current) drug therapy: Secondary | ICD-10-CM | POA: Diagnosis not present

## 2021-10-28 DIAGNOSIS — F0152 Vascular dementia, unspecified severity, with psychotic disturbance: Secondary | ICD-10-CM | POA: Diagnosis not present

## 2021-10-28 DIAGNOSIS — F331 Major depressive disorder, recurrent, moderate: Secondary | ICD-10-CM | POA: Diagnosis not present

## 2021-10-28 DIAGNOSIS — G4701 Insomnia due to medical condition: Secondary | ICD-10-CM | POA: Diagnosis not present

## 2021-11-15 DIAGNOSIS — E038 Other specified hypothyroidism: Secondary | ICD-10-CM | POA: Diagnosis not present

## 2021-11-15 DIAGNOSIS — N1832 Chronic kidney disease, stage 3b: Secondary | ICD-10-CM | POA: Diagnosis not present

## 2021-11-15 DIAGNOSIS — E1122 Type 2 diabetes mellitus with diabetic chronic kidney disease: Secondary | ICD-10-CM | POA: Diagnosis not present

## 2021-11-15 DIAGNOSIS — G4701 Insomnia due to medical condition: Secondary | ICD-10-CM | POA: Diagnosis not present

## 2021-11-20 DIAGNOSIS — F0150 Vascular dementia without behavioral disturbance: Secondary | ICD-10-CM | POA: Diagnosis not present

## 2021-11-20 DIAGNOSIS — G4701 Insomnia due to medical condition: Secondary | ICD-10-CM | POA: Diagnosis not present

## 2021-11-20 DIAGNOSIS — F331 Major depressive disorder, recurrent, moderate: Secondary | ICD-10-CM | POA: Diagnosis not present

## 2021-12-06 DIAGNOSIS — I480 Paroxysmal atrial fibrillation: Secondary | ICD-10-CM | POA: Diagnosis not present

## 2021-12-06 DIAGNOSIS — D518 Other vitamin B12 deficiency anemias: Secondary | ICD-10-CM | POA: Diagnosis not present

## 2021-12-06 DIAGNOSIS — E039 Hypothyroidism, unspecified: Secondary | ICD-10-CM | POA: Diagnosis not present

## 2021-12-06 DIAGNOSIS — I119 Hypertensive heart disease without heart failure: Secondary | ICD-10-CM | POA: Diagnosis not present

## 2021-12-17 DIAGNOSIS — F331 Major depressive disorder, recurrent, moderate: Secondary | ICD-10-CM | POA: Diagnosis not present

## 2021-12-17 DIAGNOSIS — G4701 Insomnia due to medical condition: Secondary | ICD-10-CM | POA: Diagnosis not present

## 2021-12-17 DIAGNOSIS — G301 Alzheimer's disease with late onset: Secondary | ICD-10-CM | POA: Diagnosis not present

## 2021-12-17 DIAGNOSIS — F0152 Vascular dementia, unspecified severity, with psychotic disturbance: Secondary | ICD-10-CM | POA: Diagnosis not present

## 2021-12-18 NOTE — Progress Notes (Unsigned)
PATIENT: William Fisher DOB: 05-Jul-1931  REASON FOR VISIT: Follow up HISTORY FROM: Patient, son: William Fisher  PRIMARY NEUROLOGIST: Dr. Marjory Fisher now that Dr. Anne Fisher is retired   HISTORY OF PRESENT ILLNESS: Today 12/19/21 William Fisher is here today for follow-up.  In December 2022 MMSE was 13/30. Didn't wear his hearing aids today, very hard of hearing. Doing well at [facility], memory care. Needs assistance with dressing. Can feed himself. Eats well. Son doesn't think he fully remembers him at times. Can lose his train of thought. Sometimes has vivid dreams. Doesn't do social activities, hearing limits this. He is very pleasant, no issues.   Update 06/13/2021 SS: William Fisher here today for follow-up for progressive memory disturbance. In May 2022, MMSE 15/30.Lives at [facility] facility memory unit. Remains on Namenda 10 mg twice daily, Aricept 5 mg at bedtime. No change since Aricept is added, but doesn't seem any worse. He is very hard of hearing. Most of the time is in good spirits, 5% of the time, can be moody. His son brings pictures to visit, he has a hard time recalling names. Sleeping well at night, no falls. Limited social interaction, doesn't participate in activities. MMSE is 13/30 today. Here today with his son, William Fisher. Have been through 3 pairs of high end hearing aids, his current hearing aids don't work great. 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 His family is considering bringing him home with 24 hr supervision or changing facilities. He doesn't drive.  HISTORY 11/22/2020 Dr. Anne Fisher: William Fisher is an 88 year old right-handed white male with a history of a progressive dementing illness.  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  The patient has had a progressive memory disturbance over the last 6 to 7 years.  On his initial evaluation in January 2016, he was scoring 26 out of 30 on the Mini-Mental status examination, he is now scoring 15/30.   The patient currently resides at [facility] extended-care facility.  He is eating and drinking fairly well, he is maintaining his weight.  He sleeps well at night but sleeps also during the day.  He does not engage in social activities.  He keeps talking about wanting to leave and to drive his car.  He comes in today for further evaluation.  The patient is on Namenda taking 10 mg twice daily.   REVIEW OF SYSTEMS: Out of a complete 14 system review of symptoms, the patient complains only of the following symptoms, and all other reviewed systems are negative.  See HPI  ALLERGIES: Allergies  Allergen Reactions   Codeine    Guaifenesin    Zinc Sulfate [Zinc]    HOME MEDICATIONS: Outpatient Medications Prior to Visit  Medication Sig Dispense Refill   acetaminophen (TYLENOL) 500 MG tablet Take 500-1,000 mg by mouth every 6 (six) hours as needed.     aspirin EC 81 MG tablet Take 81 mg by mouth daily. Swallow whole.     Cholecalciferol (VITAMIN D3) 50 MCG (2000 UT) TABS Take by mouth.     Cyanocobalamin (B-12) 2500 MCG TABS Take 1 tablet by mouth daily.     donepezil (ARICEPT) 5 MG tablet Take 1 tablet (5 mg total) by mouth at bedtime. 30 tablet 3   JANUMET 50-1000 MG per tablet Take 1 tablet by mouth 2 (two) times daily with a meal.   5   levothyroxine (SYNTHROID) 25 MCG tablet Take 25 mcg by mouth daily before breakfast.  melatonin 5 MG TABS Take 5 mg by mouth.     memantine (NAMENDA) 10 MG tablet Take 1 tablet (10 mg total) by mouth 2 (two) times daily.     Multiple Vitamin (MULTIVITAMIN) capsule Take 1 capsule by mouth daily.     Polyethyl Glycol-Propyl Glycol (SYSTANE OP) Apply 1 drop to eye.     sertraline (ZOLOFT) 50 MG tablet Take 50 mg by mouth daily.     pioglitazone (ACTOS) 15 MG tablet Take 15 mg by mouth daily.     No facility-administered medications prior to visit.    PAST MEDICAL HISTORY: Past Medical History:  Diagnosis Date   Coronary artery disease    Dementia (HCC)     Dementia (HCC) 11/22/2020   Diabetes mellitus (HCC)    Diverticulosis    Herpes encephalitis    HOH (hard of hearing)    hearing aides   Hyperlipidemia    Hyponatremia    Lung nodule    Chronic   Memory change 07/13/2014   Orthostatic hypotension    Palpitations    Paroxysmal atrial fibrillation (HCC)    Rhabdomyolysis    Mild   SOB (shortness of breath)     PAST SURGICAL HISTORY: Past Surgical History:  Procedure Laterality Date   HERNIA REPAIR     TONSILLECTOMY      FAMILY HISTORY: Family History  Problem Relation Age of Onset   Stroke Father    Heart disease Brother    Stroke Brother     SOCIAL HISTORY: Social History   Socioeconomic History   Marital status: Widowed    Spouse name: Not on file   Number of children: 4   Years of education: HS   Highest education level: Not on file  Occupational History   Occupation: Retired  Tobacco Use   Smoking status: Never   Smokeless tobacco: Never  Substance and Sexual Activity   Alcohol use: No   Drug use: No   Sexual activity: Not Currently  Other Topics Concern   Not on file  Social History Narrative   Patient is right handed.   Patient drinks some caffeine daily.   Patient lives at [facility], [place]   Social Determinants of Health   Financial Resource Strain: Not on file  Food Insecurity: Not on file  Transportation Needs: Not on file  Physical Activity: Not on file  Stress: Not on file  Social Connections: Not on file  Intimate Partner Violence: Not on file   PHYSICAL EXAM  Vitals:   12/19/21 1032  BP: 134/68  Pulse: 88  Weight: 168 lb (76.2 kg)  Height: 5\' 7"  (1.702 m)    Body mass index is 26.31 kg/m.  Generalized: Well developed, in no acute distress     06/13/2021   10:12 AM 11/22/2020   10:12 AM 05/23/2020    9:15 AM  MMSE - Mini Mental State Exam  Orientation to time 1 2 2   Orientation to Place 4 2 4   Registration 0 3 3  Attention/ Calculation 0 0 1   Recall 0 0 0  Language- name 2 objects 2 2 2   Language- repeat 1 1 0  Language- follow 3 step command 3 2 3   Language- read & follow direction 1 1 1   Write a sentence 1 1 1   Copy design 0 1 0  Total score 13 15 17     Neurological examination  Mentation: Alert, very hard of hearing, pleasant, history is provided by his son.  Follows all commands speech and language fluent, knows DOB, the current year, place  Cranial nerve II-XII: Pupils were equal round reactive to light. Extraocular movements were full, visual field were full on confrontational test. Facial sensation and strength were normal. Head turning and shoulder shrug  were normal and symmetric. Motor: Good strength all extremities Sensory: Sensory testing is intact to soft touch on all 4 extremities. No evidence of extinction is noted.  Coordination: Cerebellar testing reveals good finger-nose-finger and heel-to-shin bilaterally.  Gait and station: Gait is normal, slightly wide-based, but independent Reflexes: Deep tendon reflexes are symmetric  DIAGNOSTIC DATA (LABS, IMAGING, TESTING) - I reviewed patient records, labs, notes, testing and imaging myself where available.  Lab Results  Component Value Date   WBC 8.1 02/28/2020   HGB 11.2 (L) 02/28/2020   HCT 34.1 (L) 02/28/2020   MCV 92.7 02/28/2020   PLT 287 02/28/2020      Component Value Date/Time   NA 135 02/28/2020 0645   K 3.4 (L) 02/28/2020 0645   CL 101 02/28/2020 0645   CO2 23 02/28/2020 0645   GLUCOSE 107 (H) 02/28/2020 0645   BUN 17 02/28/2020 0645   CREATININE 0.75 02/28/2020 0645   CALCIUM 8.3 (L) 02/28/2020 0645   PROT 6.8 02/28/2020 0645   ALBUMIN 3.1 (L) 02/28/2020 0645   AST 80 (H) 02/28/2020 0645   ALT 30 02/28/2020 0645   ALKPHOS 46 02/28/2020 0645   BILITOT 1.0 02/28/2020 0645   GFRNONAA >60 02/28/2020 0645   GFRAA >60 02/28/2020 0645   Lab Results  Component Value Date   CHOL  02/28/2010    114        ATP III CLASSIFICATION:  <200      mg/dL   Desirable  092-330  mg/dL   Borderline High  >=076    mg/dL   High          HDL 20 (L) 02/28/2010   LDLCALC  02/28/2010    61        Total Cholesterol/HDL:CHD Risk Coronary Heart Disease Risk Table                     Men   Women  1/2 Average Risk   3.4   3.3  Average Risk       5.0   4.4  2 X Average Risk   9.6   7.1  3 X Average Risk  23.4   11.0        Use the calculated Patient Ratio above and the CHD Risk Table to determine the patient's CHD Risk.        ATP III CLASSIFICATION (LDL):  <100     mg/dL   Optimal  226-333  mg/dL   Near or Above                    Optimal  130-159  mg/dL   Borderline  545-625  mg/dL   High  >638     mg/dL   Very High   TRIG 85 93/73/4287   CHOLHDL 5.7 02/28/2010   Lab Results  Component Value Date   HGBA1C 6.3 (H) 02/26/2020   Lab Results  Component Value Date   VITAMINB12 6,781 (H) 02/26/2020   Lab Results  Component Value Date   TSH 2.312 02/26/2020   ASSESSMENT AND PLAN 88 y.o. year old male   Progressive memory disturbance, dementia  -Increase Aricept 10 mg daily, try taking in AM to  help with vivid dreams, son would like to try the higher dose of 10 mg vs 5 mg -Continue Namenda 10 mg twice a day -Have encouraged patient participation in activities -Follow-up in 6 months or sooner if needed for medication check, then will transition back to facility  Margie Ege, AGNP-C, DNP 12/19/2021, 10:48 AM [facility] [address] [place], [place] [address] [phone]

## 2021-12-19 ENCOUNTER — Ambulatory Visit: Payer: Medicare Other | Admitting: Neurology

## 2021-12-19 ENCOUNTER — Encounter: Payer: Self-pay | Admitting: Neurology

## 2021-12-19 VITALS — BP 134/68 | HR 88 | Ht 67.0 in | Wt 168.0 lb

## 2021-12-19 DIAGNOSIS — F02B0 Dementia in other diseases classified elsewhere, moderate, without behavioral disturbance, psychotic disturbance, mood disturbance, and anxiety: Secondary | ICD-10-CM

## 2021-12-19 DIAGNOSIS — G301 Alzheimer's disease with late onset: Secondary | ICD-10-CM | POA: Diagnosis not present

## 2021-12-19 MED ORDER — DONEPEZIL HCL 10 MG PO TABS: 10.0000 mg | ORAL_TABLET | Freq: Every day | ORAL | 5 refills | Status: DC

## 2021-12-19 NOTE — Patient Instructions (Signed)
Increase Aricept 10 mg daily, try taking in the AM to see if less dreams, watch out for diarrhea, reflux Continue Namenda See you back in 6 months

## 2021-12-25 DIAGNOSIS — L84 Corns and callosities: Secondary | ICD-10-CM | POA: Diagnosis not present

## 2021-12-25 DIAGNOSIS — E1151 Type 2 diabetes mellitus with diabetic peripheral angiopathy without gangrene: Secondary | ICD-10-CM | POA: Diagnosis not present

## 2021-12-27 ENCOUNTER — Telehealth: Payer: Self-pay | Admitting: Neurology

## 2021-12-27 NOTE — Telephone Encounter (Signed)
William Fisher William Fisher) pt having reaction to donepezil (ARICEPT) 10 MG tablet;decrease appietite and nausea. Would like a call from the nurse

## 2021-12-27 NOTE — Telephone Encounter (Signed)
Per vo by Margie Ege, NP, decrease the donepezil dose back down to 5mg , one tablet in the morning. If symptoms continue, he may stop this medication. I called back at the facility and provide these instructions verbally. She would also like this information faxed to [phone].

## 2021-12-27 NOTE — Telephone Encounter (Signed)
I spoke to [place] at [facility] (correct [phone]). The patient has been experiencing a decreased appetite, along with nausea and vomiting. The only change has been the increase in his donepezil from 5mg  to 10mg .

## 2021-12-28 NOTE — Telephone Encounter (Signed)
Chip Boer Marcelino Duster) said did not received the fax. Asking if can resent the fax. Would like a call back to [phone].

## 2021-12-31 NOTE — Telephone Encounter (Signed)
[facility] do not accept conversation piece as a order. Need order refaxed. Would like a call back to [phone]

## 2021-12-31 NOTE — Telephone Encounter (Signed)
Orders re-faxed to day with confirmation received.

## 2021-12-31 NOTE — Telephone Encounter (Signed)
Orders received, signed by our work-in MD (Dr. Epimenio Foot) and faxed back to [place] at [place] [facility]. Confirmation received.

## 2021-12-31 NOTE — Telephone Encounter (Addendum)
I spoke to [place] at [place]. She is going to fax over orders for signature (to our POD at fax #[phone].).

## 2022-01-01 DIAGNOSIS — F0152 Vascular dementia, unspecified severity, with psychotic disturbance: Secondary | ICD-10-CM | POA: Diagnosis not present

## 2022-01-01 DIAGNOSIS — I119 Hypertensive heart disease without heart failure: Secondary | ICD-10-CM | POA: Diagnosis not present

## 2022-01-01 DIAGNOSIS — D518 Other vitamin B12 deficiency anemias: Secondary | ICD-10-CM | POA: Diagnosis not present

## 2022-01-01 DIAGNOSIS — I480 Paroxysmal atrial fibrillation: Secondary | ICD-10-CM | POA: Diagnosis not present

## 2022-01-04 DIAGNOSIS — Z79899 Other long term (current) drug therapy: Secondary | ICD-10-CM | POA: Diagnosis not present

## 2022-01-13 DIAGNOSIS — F331 Major depressive disorder, recurrent, moderate: Secondary | ICD-10-CM | POA: Diagnosis not present

## 2022-01-13 DIAGNOSIS — F0152 Vascular dementia, unspecified severity, with psychotic disturbance: Secondary | ICD-10-CM | POA: Diagnosis not present

## 2022-01-13 DIAGNOSIS — G301 Alzheimer's disease with late onset: Secondary | ICD-10-CM | POA: Diagnosis not present

## 2022-02-05 DIAGNOSIS — I480 Paroxysmal atrial fibrillation: Secondary | ICD-10-CM | POA: Diagnosis not present

## 2022-02-05 DIAGNOSIS — E039 Hypothyroidism, unspecified: Secondary | ICD-10-CM | POA: Diagnosis not present

## 2022-02-05 DIAGNOSIS — D518 Other vitamin B12 deficiency anemias: Secondary | ICD-10-CM | POA: Diagnosis not present

## 2022-02-05 DIAGNOSIS — I119 Hypertensive heart disease without heart failure: Secondary | ICD-10-CM | POA: Diagnosis not present

## 2022-02-12 DIAGNOSIS — F331 Major depressive disorder, recurrent, moderate: Secondary | ICD-10-CM | POA: Diagnosis not present

## 2022-02-12 DIAGNOSIS — G301 Alzheimer's disease with late onset: Secondary | ICD-10-CM | POA: Diagnosis not present

## 2022-02-12 DIAGNOSIS — F0152 Vascular dementia, unspecified severity, with psychotic disturbance: Secondary | ICD-10-CM | POA: Diagnosis not present

## 2022-02-24 DIAGNOSIS — G301 Alzheimer's disease with late onset: Secondary | ICD-10-CM | POA: Diagnosis not present

## 2022-02-24 DIAGNOSIS — E038 Other specified hypothyroidism: Secondary | ICD-10-CM | POA: Diagnosis not present

## 2022-03-12 DIAGNOSIS — I480 Paroxysmal atrial fibrillation: Secondary | ICD-10-CM | POA: Diagnosis not present

## 2022-03-12 DIAGNOSIS — D518 Other vitamin B12 deficiency anemias: Secondary | ICD-10-CM | POA: Diagnosis not present

## 2022-03-12 DIAGNOSIS — E039 Hypothyroidism, unspecified: Secondary | ICD-10-CM | POA: Diagnosis not present

## 2022-03-12 DIAGNOSIS — I119 Hypertensive heart disease without heart failure: Secondary | ICD-10-CM | POA: Diagnosis not present

## 2022-03-13 DIAGNOSIS — F0152 Vascular dementia, unspecified severity, with psychotic disturbance: Secondary | ICD-10-CM | POA: Diagnosis not present

## 2022-03-13 DIAGNOSIS — G4701 Insomnia due to medical condition: Secondary | ICD-10-CM | POA: Diagnosis not present

## 2022-03-13 DIAGNOSIS — G301 Alzheimer's disease with late onset: Secondary | ICD-10-CM | POA: Diagnosis not present

## 2022-03-13 DIAGNOSIS — F331 Major depressive disorder, recurrent, moderate: Secondary | ICD-10-CM | POA: Diagnosis not present

## 2022-03-19 DIAGNOSIS — E1151 Type 2 diabetes mellitus with diabetic peripheral angiopathy without gangrene: Secondary | ICD-10-CM | POA: Diagnosis not present

## 2022-03-19 DIAGNOSIS — L84 Corns and callosities: Secondary | ICD-10-CM | POA: Diagnosis not present

## 2022-03-21 DIAGNOSIS — E038 Other specified hypothyroidism: Secondary | ICD-10-CM | POA: Diagnosis not present

## 2022-03-21 DIAGNOSIS — G301 Alzheimer's disease with late onset: Secondary | ICD-10-CM | POA: Diagnosis not present

## 2022-04-16 DIAGNOSIS — N1832 Chronic kidney disease, stage 3b: Secondary | ICD-10-CM | POA: Diagnosis not present

## 2022-04-16 DIAGNOSIS — I480 Paroxysmal atrial fibrillation: Secondary | ICD-10-CM | POA: Diagnosis not present

## 2022-04-16 DIAGNOSIS — F0152 Vascular dementia, unspecified severity, with psychotic disturbance: Secondary | ICD-10-CM | POA: Diagnosis not present

## 2022-04-16 DIAGNOSIS — D518 Other vitamin B12 deficiency anemias: Secondary | ICD-10-CM | POA: Diagnosis not present

## 2022-04-19 DIAGNOSIS — Z79899 Other long term (current) drug therapy: Secondary | ICD-10-CM | POA: Diagnosis not present

## 2022-04-19 DIAGNOSIS — G301 Alzheimer's disease with late onset: Secondary | ICD-10-CM | POA: Diagnosis not present

## 2022-04-19 DIAGNOSIS — E038 Other specified hypothyroidism: Secondary | ICD-10-CM | POA: Diagnosis not present

## 2022-04-19 DIAGNOSIS — E559 Vitamin D deficiency, unspecified: Secondary | ICD-10-CM | POA: Diagnosis not present

## 2022-04-21 DIAGNOSIS — F5101 Primary insomnia: Secondary | ICD-10-CM | POA: Diagnosis not present

## 2022-04-21 DIAGNOSIS — G4701 Insomnia due to medical condition: Secondary | ICD-10-CM | POA: Diagnosis not present

## 2022-04-21 DIAGNOSIS — F331 Major depressive disorder, recurrent, moderate: Secondary | ICD-10-CM | POA: Diagnosis not present

## 2022-04-21 DIAGNOSIS — G301 Alzheimer's disease with late onset: Secondary | ICD-10-CM | POA: Diagnosis not present

## 2022-04-29 ENCOUNTER — Encounter: Payer: Self-pay | Admitting: Neurology

## 2022-05-08 DIAGNOSIS — F331 Major depressive disorder, recurrent, moderate: Secondary | ICD-10-CM | POA: Diagnosis not present

## 2022-05-08 DIAGNOSIS — F5101 Primary insomnia: Secondary | ICD-10-CM | POA: Diagnosis not present

## 2022-05-08 DIAGNOSIS — G301 Alzheimer's disease with late onset: Secondary | ICD-10-CM | POA: Diagnosis not present

## 2022-05-08 DIAGNOSIS — G4701 Insomnia due to medical condition: Secondary | ICD-10-CM | POA: Diagnosis not present

## 2022-05-21 DIAGNOSIS — N1832 Chronic kidney disease, stage 3b: Secondary | ICD-10-CM | POA: Diagnosis not present

## 2022-05-21 DIAGNOSIS — F0150 Vascular dementia without behavioral disturbance: Secondary | ICD-10-CM | POA: Diagnosis not present

## 2022-05-21 DIAGNOSIS — F5101 Primary insomnia: Secondary | ICD-10-CM | POA: Diagnosis not present

## 2022-05-21 DIAGNOSIS — I480 Paroxysmal atrial fibrillation: Secondary | ICD-10-CM | POA: Diagnosis not present

## 2022-05-30 DIAGNOSIS — E038 Other specified hypothyroidism: Secondary | ICD-10-CM | POA: Diagnosis not present

## 2022-05-30 DIAGNOSIS — G301 Alzheimer's disease with late onset: Secondary | ICD-10-CM | POA: Diagnosis not present

## 2022-05-31 DIAGNOSIS — F0150 Vascular dementia without behavioral disturbance: Secondary | ICD-10-CM | POA: Diagnosis not present

## 2022-05-31 DIAGNOSIS — N1832 Chronic kidney disease, stage 3b: Secondary | ICD-10-CM | POA: Diagnosis not present

## 2022-05-31 DIAGNOSIS — G4701 Insomnia due to medical condition: Secondary | ICD-10-CM | POA: Diagnosis not present

## 2022-05-31 DIAGNOSIS — G301 Alzheimer's disease with late onset: Secondary | ICD-10-CM | POA: Diagnosis not present

## 2022-05-31 DIAGNOSIS — F331 Major depressive disorder, recurrent, moderate: Secondary | ICD-10-CM | POA: Diagnosis not present

## 2022-05-31 DIAGNOSIS — I480 Paroxysmal atrial fibrillation: Secondary | ICD-10-CM | POA: Diagnosis not present

## 2022-05-31 DIAGNOSIS — E1122 Type 2 diabetes mellitus with diabetic chronic kidney disease: Secondary | ICD-10-CM | POA: Diagnosis not present

## 2022-05-31 DIAGNOSIS — I119 Hypertensive heart disease without heart failure: Secondary | ICD-10-CM | POA: Diagnosis not present

## 2022-06-04 DIAGNOSIS — L84 Corns and callosities: Secondary | ICD-10-CM | POA: Diagnosis not present

## 2022-06-04 DIAGNOSIS — E1151 Type 2 diabetes mellitus with diabetic peripheral angiopathy without gangrene: Secondary | ICD-10-CM | POA: Diagnosis not present

## 2022-06-17 DIAGNOSIS — I119 Hypertensive heart disease without heart failure: Secondary | ICD-10-CM | POA: Diagnosis not present

## 2022-06-17 DIAGNOSIS — E038 Other specified hypothyroidism: Secondary | ICD-10-CM | POA: Diagnosis not present

## 2022-06-17 DIAGNOSIS — E1122 Type 2 diabetes mellitus with diabetic chronic kidney disease: Secondary | ICD-10-CM | POA: Diagnosis not present

## 2022-06-17 DIAGNOSIS — G4701 Insomnia due to medical condition: Secondary | ICD-10-CM | POA: Diagnosis not present

## 2022-06-26 ENCOUNTER — Ambulatory Visit: Payer: Medicare Other | Admitting: Neurology

## 2022-06-27 DIAGNOSIS — E038 Other specified hypothyroidism: Secondary | ICD-10-CM | POA: Diagnosis not present

## 2022-06-27 DIAGNOSIS — G301 Alzheimer's disease with late onset: Secondary | ICD-10-CM | POA: Diagnosis not present

## 2022-07-03 DIAGNOSIS — G4701 Insomnia due to medical condition: Secondary | ICD-10-CM | POA: Diagnosis not present

## 2022-07-03 DIAGNOSIS — G301 Alzheimer's disease with late onset: Secondary | ICD-10-CM | POA: Diagnosis not present

## 2022-07-03 DIAGNOSIS — F331 Major depressive disorder, recurrent, moderate: Secondary | ICD-10-CM | POA: Diagnosis not present

## 2022-07-03 DIAGNOSIS — F0150 Vascular dementia without behavioral disturbance: Secondary | ICD-10-CM | POA: Diagnosis not present

## 2022-07-09 ENCOUNTER — Ambulatory Visit: Payer: BLUE CROSS/BLUE SHIELD | Admitting: Neurology

## 2022-07-09 ENCOUNTER — Encounter: Payer: Self-pay | Admitting: Neurology

## 2022-07-09 VITALS — BP 155/64 | HR 86 | Ht 67.0 in | Wt 163.5 lb

## 2022-07-09 DIAGNOSIS — F331 Major depressive disorder, recurrent, moderate: Secondary | ICD-10-CM | POA: Diagnosis not present

## 2022-07-09 DIAGNOSIS — G301 Alzheimer's disease with late onset: Secondary | ICD-10-CM | POA: Diagnosis not present

## 2022-07-09 DIAGNOSIS — F02B0 Dementia in other diseases classified elsewhere, moderate, without behavioral disturbance, psychotic disturbance, mood disturbance, and anxiety: Secondary | ICD-10-CM

## 2022-07-09 NOTE — Patient Instructions (Signed)
Continue current medications  No changes  Primary care at facility can refill medications going forward We discussed encouraging him to see audiology See you back as needed

## 2022-07-09 NOTE — Progress Notes (Signed)
PATIENT: William Fisher DOB: 23-May-1932  REASON FOR VISIT: Follow up HISTORY FROM: Patient, son: William Fisher  PRIMARY NEUROLOGIST: Dr. Marjory Lies now that Dr. Anne Hahn is retired   HISTORY OF PRESENT ILLNESS: Today 07/09/22 Here today for follow-up. He tried higher dose Aricept 10 mg he had dreams and GI upset. Back on 5 mg daily. He still lives at [facility], doing well there. No issues at facility. He is a kind, gentle spirit. He keeps a notebook pad of the date, how many pills he took. Very hard of hearing. No concerns. Sons visits often, is clearest in the morning. The hearing keeps him from doing social interaction. He is against audiology evaluation.   Update 12/19/21 AC:ZYSAYT L William Fisher is here today for follow-up.  In December 2022 MMSE was 13/30. Didn't wear his hearing aids today, very hard of hearing. Doing well at [facility], memory care. Needs assistance with dressing. Can feed himself. Eats well. Son doesn't think he fully remembers him at times. Can lose his train of thought. Sometimes has vivid dreams. Doesn't do social activities, hearing limits this. He is very pleasant, no issues.   Update 06/13/2021 SS: Mr. William Fisher here today for follow-up for progressive memory disturbance. In May 2022, MMSE 15/30.Lives at [facility] facility memory unit. Remains on Namenda 10 mg twice daily, Aricept 5 mg at bedtime. No change since Aricept is added, but doesn't seem any worse. He is very hard of hearing. Most of the time is in good spirits, 5% of the time, can be moody. His son brings pictures to visit, he has a hard time recalling names. Sleeping well at night, no falls. Limited social interaction, doesn't participate in activities. MMSE is 13/30 today. Here today with his son, William Fisher. Have been through 3 pairs of high end hearing aids, his current hearing aids don't work great. 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 His family is considering bringing him home with 24 hr supervision or changing facilities. He  doesn't drive.  HISTORY 11/22/2020 Dr. Anne Hahn: Mr. William Fisher is an 89 year old right-handed white male with a history of a progressive dementing illness.  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  The patient has had a progressive memory disturbance over the last 6 to 7 years.  On his initial evaluation in January 2016, he was scoring 26 out of 30 on the Mini-Mental status examination, he is now scoring 15/30.  The patient currently resides at [facility] extended-care facility.  He is eating and drinking fairly well, he is maintaining his weight.  He sleeps well at night but sleeps also during the day.  He does not engage in social activities.  He keeps talking about wanting to leave and to drive his car.  He comes in today for further evaluation.  The patient is on Namenda taking 10 mg twice daily.   REVIEW OF SYSTEMS: Out of a complete 14 system review of symptoms, the patient complains only of the following symptoms, and all other reviewed systems are negative.  See HPI  ALLERGIES: Allergies  Allergen Reactions   Codeine    Guaifenesin    Zinc Sulfate [Zinc]    HOME MEDICATIONS: Outpatient Medications Prior to Visit  Medication Sig Dispense Refill   acetaminophen (TYLENOL) 500 MG tablet Take 500-1,000 mg by mouth every 6 (six) hours as needed.     Cholecalciferol (VITAMIN D3) 50 MCG (2000 UT) TABS Take by mouth.     Cyanocobalamin (B-12) 2500 MCG  TABS Take 1 tablet by mouth daily.     donepezil (ARICEPT) 5 MG tablet Take 5 mg by mouth in the morning.     JANUMET 50-1000 MG per tablet Take 1 tablet by mouth 2 (two) times daily with a meal.   5   levothyroxine (SYNTHROID) 50 MCG tablet Take 25 mcg by mouth daily before breakfast.     melatonin 5 MG TABS Take 5 mg by mouth.     memantine (NAMENDA) 10 MG tablet Take 1 tablet (10 mg total) by mouth 2 (two) times daily.     Multiple Vitamin (MULTIVITAMIN) capsule Take 1 capsule by mouth daily.      Multiple Vitamins-Minerals (CERTAVITE SENIOR PO) Take 1 tablet by mouth daily.     Polyethyl Glycol-Propyl Glycol (SYSTANE OP) Apply 1 drop to eye.     sertraline (ZOLOFT) 50 MG tablet Take 50 mg by mouth daily.     aspirin EC 81 MG tablet Take 81 mg by mouth daily. Swallow whole.     No facility-administered medications prior to visit.    PAST MEDICAL HISTORY: Past Medical History:  Diagnosis Date   Coronary artery disease    Dementia (HCC)    Dementia ([place]) 11/22/2020   Diabetes mellitus ([place])    Diverticulosis    Herpes encephalitis    HOH (hard of hearing)    hearing aides   Hyperlipidemia    Hyponatremia    Lung nodule    Chronic   Memory change 07/13/2014   Orthostatic hypotension    Palpitations    Paroxysmal atrial fibrillation (HCC)    Rhabdomyolysis    Mild   SOB (shortness of breath)     PAST SURGICAL HISTORY: Past Surgical History:  Procedure Laterality Date   HERNIA REPAIR     TONSILLECTOMY      FAMILY HISTORY: Family History  Problem Relation Age of Onset   Stroke Father    Heart disease Brother    Stroke Brother     SOCIAL HISTORY: Social History   Socioeconomic History   Marital status: Widowed    Spouse name: Not on file   Number of children: 4   Years of education: HS   Highest education level: Not on file  Occupational History   Occupation: Retired  Tobacco Use   Smoking status: Never   Smokeless tobacco: Never  Substance and Sexual Activity   Alcohol use: No   Drug use: No   Sexual activity: Not Currently  Other Topics Concern   Not on file  Social History Narrative   Patient is right handed.   Patient drinks some caffeine daily.   Patient lives at [facility], [employer]   Social Determinants of Health   Financial Resource Strain: Not on file  Food Insecurity: Not on file  Transportation Needs: Not on file  Physical Activity: Not on file  Stress: Not on file  Social Connections: Not on file  Intimate  Partner Violence: Not on file   PHYSICAL EXAM  Vitals:   07/09/22 1405  BP: (!) 155/64  Pulse: 86  Weight: 163 lb 8 oz (74.2 kg)  Height: 5\' 7"  (1.702 m)     Body mass index is 25.61 kg/m.  Generalized: Well developed, in no acute distress     06/13/2021   10:12 AM 11/22/2020   10:12 AM 05/23/2020    9:15 AM  MMSE - Mini Mental State Exam  Orientation to time 1 2 2   Orientation to Place 4  2 4  Registration 0 3 3  Attention/ Calculation 0 0 1  Recall 0 0 0  Language- name 2 objects 2 2 2   Language- repeat 1 1 0  Language- follow 3 step command 3 2 3   Language- read & follow direction 1 1 1   Write a sentence 1 1 1   Copy design 0 1 0  Total score 13 15 17     Neurological examination  Mentation: Alert, very hard of hearing, pleasant, history is provided by his son. Follows all commands speech and language fluent  Cranial nerve II-XII: Pupils were equal round reactive to light. Extraocular movements were full, visual field were full on confrontational test. Facial sensation and strength were normal. Head turning and shoulder shrug  were normal and symmetric. Motor: Good strength all extremities Sensory: Sensory testing is intact to soft touch on all 4 extremities. No evidence of extinction is noted.  Coordination: Cerebellar testing reveals good finger-nose-finger and heel-to-shin bilaterally.  Gait and station: Gait is normal, slightly wide-based, but independent Reflexes: Deep tendon reflexes are symmetric  DIAGNOSTIC DATA (LABS, IMAGING, TESTING) - I reviewed patient records, labs, notes, testing and imaging myself where available.  Lab Results  Component Value Date   WBC 8.1 02/28/2020   HGB 11.2 (L) 02/28/2020   HCT 34.1 (L) 02/28/2020   MCV 92.7 02/28/2020   PLT 287 02/28/2020      Component Value Date/Time   NA 135 02/28/2020 0645   K 3.4 (L) 02/28/2020 0645   CL 101 02/28/2020 0645   CO2 23 02/28/2020 0645   GLUCOSE 107 (H) 02/28/2020 0645   BUN 17  02/28/2020 0645   CREATININE 0.75 02/28/2020 0645   CALCIUM 8.3 (L) 02/28/2020 0645   PROT 6.8 02/28/2020 0645   ALBUMIN 3.1 (L) 02/28/2020 0645   AST 80 (H) 02/28/2020 0645   ALT 30 02/28/2020 0645   ALKPHOS 46 02/28/2020 0645   BILITOT 1.0 02/28/2020 0645   GFRNONAA >60 02/28/2020 0645   GFRAA >60 02/28/2020 0645   Lab Results  Component Value Date   CHOL  02/28/2010    114        ATP III CLASSIFICATION:  <200     mg/dL   Desirable  03/01/2020  mg/dL   Borderline High  03/01/2020    mg/dL   High          HDL 20 (L) 02/28/2010   LDLCALC  02/28/2010    61        Total Cholesterol/HDL:CHD Risk Coronary Heart Disease Risk Table                     Men   Women  1/2 Average Risk   3.4   3.3  Average Risk       5.0   4.4  2 X Average Risk   9.6   7.1  3 X Average Risk  23.4   11.0        Use the calculated Patient Ratio above and the CHD Risk Table to determine the patient's CHD Risk.        ATP III CLASSIFICATION (LDL):  <100     mg/dL   Optimal  03/02/2010  mg/dL   Near or Above                    Optimal  130-159  mg/dL   Borderline  809-983  mg/dL   High  >=382     mg/dL  Very High   TRIG 85 02/26/2020   CHOLHDL 5.7 02/28/2010   Lab Results  Component Value Date   HGBA1C 6.3 (H) 02/26/2020   Lab Results  Component Value Date   VITAMINB12 6,781 (H) 02/26/2020   Lab Results  Component Value Date   TSH 2.312 02/26/2020   ASSESSMENT AND PLAN 89 y.o. year old male   Progressive memory disturbance, dementia  -Remains overall stable, continues to gradually decline, has significant hearing loss -Continue Aricept 5 mg daily, could not tolerate higher dosing -Continue Namenda 10 mg twice a day -Facility providers can refill medications going forward, return here as needed  Margie Ege, AGNP-C, DNP 07/09/2022, 2:15 PM [facility] [address], [address] [place], [place] [address] [phone]

## 2022-07-16 DIAGNOSIS — N1832 Chronic kidney disease, stage 3b: Secondary | ICD-10-CM | POA: Diagnosis not present

## 2022-07-16 DIAGNOSIS — E038 Other specified hypothyroidism: Secondary | ICD-10-CM | POA: Diagnosis not present

## 2022-07-16 DIAGNOSIS — E1122 Type 2 diabetes mellitus with diabetic chronic kidney disease: Secondary | ICD-10-CM | POA: Diagnosis not present

## 2022-07-16 DIAGNOSIS — I119 Hypertensive heart disease without heart failure: Secondary | ICD-10-CM | POA: Diagnosis not present

## 2022-07-17 DIAGNOSIS — G301 Alzheimer's disease with late onset: Secondary | ICD-10-CM | POA: Diagnosis not present

## 2022-07-17 DIAGNOSIS — E038 Other specified hypothyroidism: Secondary | ICD-10-CM | POA: Diagnosis not present

## 2022-07-23 DIAGNOSIS — F331 Major depressive disorder, recurrent, moderate: Secondary | ICD-10-CM | POA: Diagnosis not present

## 2022-08-06 DIAGNOSIS — F331 Major depressive disorder, recurrent, moderate: Secondary | ICD-10-CM | POA: Diagnosis not present

## 2022-08-07 DIAGNOSIS — F01B3 Vascular dementia, moderate, with mood disturbance: Secondary | ICD-10-CM | POA: Diagnosis not present

## 2022-08-07 DIAGNOSIS — G301 Alzheimer's disease with late onset: Secondary | ICD-10-CM | POA: Diagnosis not present

## 2022-08-07 DIAGNOSIS — F5101 Primary insomnia: Secondary | ICD-10-CM | POA: Diagnosis not present

## 2022-08-07 DIAGNOSIS — F331 Major depressive disorder, recurrent, moderate: Secondary | ICD-10-CM | POA: Diagnosis not present

## 2022-08-13 DIAGNOSIS — N1832 Chronic kidney disease, stage 3b: Secondary | ICD-10-CM | POA: Diagnosis not present

## 2022-08-13 DIAGNOSIS — D518 Other vitamin B12 deficiency anemias: Secondary | ICD-10-CM | POA: Diagnosis not present

## 2022-08-13 DIAGNOSIS — I119 Hypertensive heart disease without heart failure: Secondary | ICD-10-CM | POA: Diagnosis not present

## 2022-08-13 DIAGNOSIS — E1122 Type 2 diabetes mellitus with diabetic chronic kidney disease: Secondary | ICD-10-CM | POA: Diagnosis not present

## 2022-08-14 DIAGNOSIS — E038 Other specified hypothyroidism: Secondary | ICD-10-CM | POA: Diagnosis not present

## 2022-08-14 DIAGNOSIS — G301 Alzheimer's disease with late onset: Secondary | ICD-10-CM | POA: Diagnosis not present

## 2022-08-16 DIAGNOSIS — Z79899 Other long term (current) drug therapy: Secondary | ICD-10-CM | POA: Diagnosis not present

## 2022-08-20 DIAGNOSIS — E1151 Type 2 diabetes mellitus with diabetic peripheral angiopathy without gangrene: Secondary | ICD-10-CM | POA: Diagnosis not present

## 2022-08-20 DIAGNOSIS — L84 Corns and callosities: Secondary | ICD-10-CM | POA: Diagnosis not present

## 2022-08-20 DIAGNOSIS — F331 Major depressive disorder, recurrent, moderate: Secondary | ICD-10-CM | POA: Diagnosis not present

## 2022-08-23 DIAGNOSIS — I480 Paroxysmal atrial fibrillation: Secondary | ICD-10-CM | POA: Diagnosis not present

## 2022-08-23 DIAGNOSIS — N1832 Chronic kidney disease, stage 3b: Secondary | ICD-10-CM | POA: Diagnosis not present

## 2022-08-23 DIAGNOSIS — I119 Hypertensive heart disease without heart failure: Secondary | ICD-10-CM | POA: Diagnosis not present

## 2022-08-23 DIAGNOSIS — E1122 Type 2 diabetes mellitus with diabetic chronic kidney disease: Secondary | ICD-10-CM | POA: Diagnosis not present

## 2022-09-03 DIAGNOSIS — F331 Major depressive disorder, recurrent, moderate: Secondary | ICD-10-CM | POA: Diagnosis not present

## 2022-09-04 DIAGNOSIS — G4701 Insomnia due to medical condition: Secondary | ICD-10-CM | POA: Diagnosis not present

## 2022-09-04 DIAGNOSIS — F0150 Vascular dementia without behavioral disturbance: Secondary | ICD-10-CM | POA: Diagnosis not present

## 2022-09-04 DIAGNOSIS — F331 Major depressive disorder, recurrent, moderate: Secondary | ICD-10-CM | POA: Diagnosis not present

## 2022-09-10 DIAGNOSIS — F0150 Vascular dementia without behavioral disturbance: Secondary | ICD-10-CM | POA: Diagnosis not present

## 2022-09-10 DIAGNOSIS — I119 Hypertensive heart disease without heart failure: Secondary | ICD-10-CM | POA: Diagnosis not present

## 2022-09-10 DIAGNOSIS — E1122 Type 2 diabetes mellitus with diabetic chronic kidney disease: Secondary | ICD-10-CM | POA: Diagnosis not present

## 2022-09-10 DIAGNOSIS — G4701 Insomnia due to medical condition: Secondary | ICD-10-CM | POA: Diagnosis not present

## 2022-09-17 DIAGNOSIS — F331 Major depressive disorder, recurrent, moderate: Secondary | ICD-10-CM | POA: Diagnosis not present

## 2022-09-26 DIAGNOSIS — N390 Urinary tract infection, site not specified: Secondary | ICD-10-CM | POA: Diagnosis not present

## 2022-10-03 DIAGNOSIS — G4701 Insomnia due to medical condition: Secondary | ICD-10-CM | POA: Diagnosis not present

## 2022-10-03 DIAGNOSIS — F0150 Vascular dementia without behavioral disturbance: Secondary | ICD-10-CM | POA: Diagnosis not present

## 2022-10-03 DIAGNOSIS — R4182 Altered mental status, unspecified: Secondary | ICD-10-CM | POA: Diagnosis not present

## 2022-10-03 DIAGNOSIS — F064 Anxiety disorder due to known physiological condition: Secondary | ICD-10-CM | POA: Diagnosis not present

## 2022-10-03 DIAGNOSIS — K08409 Partial loss of teeth, unspecified cause, unspecified class: Secondary | ICD-10-CM | POA: Diagnosis not present

## 2022-10-03 DIAGNOSIS — F331 Major depressive disorder, recurrent, moderate: Secondary | ICD-10-CM | POA: Diagnosis not present

## 2022-10-08 DIAGNOSIS — F0150 Vascular dementia without behavioral disturbance: Secondary | ICD-10-CM | POA: Diagnosis not present

## 2022-10-08 DIAGNOSIS — F331 Major depressive disorder, recurrent, moderate: Secondary | ICD-10-CM | POA: Diagnosis not present

## 2022-10-08 DIAGNOSIS — E1122 Type 2 diabetes mellitus with diabetic chronic kidney disease: Secondary | ICD-10-CM | POA: Diagnosis not present

## 2022-10-08 DIAGNOSIS — G4701 Insomnia due to medical condition: Secondary | ICD-10-CM | POA: Diagnosis not present

## 2022-10-08 DIAGNOSIS — I119 Hypertensive heart disease without heart failure: Secondary | ICD-10-CM | POA: Diagnosis not present

## 2022-10-11 DIAGNOSIS — Z79899 Other long term (current) drug therapy: Secondary | ICD-10-CM | POA: Diagnosis not present

## 2022-10-15 DIAGNOSIS — F331 Major depressive disorder, recurrent, moderate: Secondary | ICD-10-CM | POA: Diagnosis not present

## 2022-10-16 DIAGNOSIS — G301 Alzheimer's disease with late onset: Secondary | ICD-10-CM | POA: Diagnosis not present

## 2022-10-16 DIAGNOSIS — E1151 Type 2 diabetes mellitus with diabetic peripheral angiopathy without gangrene: Secondary | ICD-10-CM | POA: Diagnosis not present

## 2022-10-29 DIAGNOSIS — F331 Major depressive disorder, recurrent, moderate: Secondary | ICD-10-CM | POA: Diagnosis not present

## 2022-11-04 DIAGNOSIS — E1151 Type 2 diabetes mellitus with diabetic peripheral angiopathy without gangrene: Secondary | ICD-10-CM | POA: Diagnosis not present

## 2022-11-04 DIAGNOSIS — F0150 Vascular dementia without behavioral disturbance: Secondary | ICD-10-CM | POA: Diagnosis not present

## 2022-11-04 DIAGNOSIS — E039 Hypothyroidism, unspecified: Secondary | ICD-10-CM | POA: Diagnosis not present

## 2022-11-04 DIAGNOSIS — I480 Paroxysmal atrial fibrillation: Secondary | ICD-10-CM | POA: Diagnosis not present

## 2022-11-05 DIAGNOSIS — F331 Major depressive disorder, recurrent, moderate: Secondary | ICD-10-CM | POA: Diagnosis not present

## 2022-11-05 DIAGNOSIS — M79674 Pain in right toe(s): Secondary | ICD-10-CM | POA: Diagnosis not present

## 2022-11-05 DIAGNOSIS — F0150 Vascular dementia without behavioral disturbance: Secondary | ICD-10-CM | POA: Diagnosis not present

## 2022-11-05 DIAGNOSIS — L600 Ingrowing nail: Secondary | ICD-10-CM | POA: Diagnosis not present

## 2022-11-05 DIAGNOSIS — G4701 Insomnia due to medical condition: Secondary | ICD-10-CM | POA: Diagnosis not present

## 2022-11-05 DIAGNOSIS — F064 Anxiety disorder due to known physiological condition: Secondary | ICD-10-CM | POA: Diagnosis not present

## 2022-11-05 DIAGNOSIS — M79675 Pain in left toe(s): Secondary | ICD-10-CM | POA: Diagnosis not present

## 2022-11-15 DIAGNOSIS — E039 Hypothyroidism, unspecified: Secondary | ICD-10-CM | POA: Diagnosis not present

## 2022-11-15 DIAGNOSIS — N1832 Chronic kidney disease, stage 3b: Secondary | ICD-10-CM | POA: Diagnosis not present

## 2022-11-15 DIAGNOSIS — E119 Type 2 diabetes mellitus without complications: Secondary | ICD-10-CM | POA: Diagnosis not present

## 2022-11-15 DIAGNOSIS — M6281 Muscle weakness (generalized): Secondary | ICD-10-CM | POA: Diagnosis not present

## 2022-11-18 DIAGNOSIS — F331 Major depressive disorder, recurrent, moderate: Secondary | ICD-10-CM | POA: Diagnosis not present

## 2022-11-29 DIAGNOSIS — E782 Mixed hyperlipidemia: Secondary | ICD-10-CM | POA: Diagnosis not present

## 2022-11-29 DIAGNOSIS — E1151 Type 2 diabetes mellitus with diabetic peripheral angiopathy without gangrene: Secondary | ICD-10-CM | POA: Diagnosis not present

## 2022-12-02 DIAGNOSIS — E782 Mixed hyperlipidemia: Secondary | ICD-10-CM | POA: Diagnosis not present

## 2022-12-02 DIAGNOSIS — E559 Vitamin D deficiency, unspecified: Secondary | ICD-10-CM | POA: Diagnosis not present

## 2022-12-02 DIAGNOSIS — F331 Major depressive disorder, recurrent, moderate: Secondary | ICD-10-CM | POA: Diagnosis not present

## 2022-12-03 DIAGNOSIS — F331 Major depressive disorder, recurrent, moderate: Secondary | ICD-10-CM | POA: Diagnosis not present

## 2022-12-03 DIAGNOSIS — F0150 Vascular dementia without behavioral disturbance: Secondary | ICD-10-CM | POA: Diagnosis not present

## 2022-12-03 DIAGNOSIS — G4701 Insomnia due to medical condition: Secondary | ICD-10-CM | POA: Diagnosis not present

## 2022-12-03 DIAGNOSIS — F064 Anxiety disorder due to known physiological condition: Secondary | ICD-10-CM | POA: Diagnosis not present

## 2022-12-17 DIAGNOSIS — F331 Major depressive disorder, recurrent, moderate: Secondary | ICD-10-CM | POA: Diagnosis not present

## 2022-12-27 DIAGNOSIS — I10 Essential (primary) hypertension: Secondary | ICD-10-CM | POA: Diagnosis not present

## 2022-12-27 DIAGNOSIS — E119 Type 2 diabetes mellitus without complications: Secondary | ICD-10-CM | POA: Diagnosis not present

## 2022-12-27 DIAGNOSIS — I482 Chronic atrial fibrillation, unspecified: Secondary | ICD-10-CM | POA: Diagnosis not present

## 2022-12-27 DIAGNOSIS — N183 Chronic kidney disease, stage 3 unspecified: Secondary | ICD-10-CM | POA: Diagnosis not present

## 2022-12-27 DIAGNOSIS — E782 Mixed hyperlipidemia: Secondary | ICD-10-CM | POA: Diagnosis not present

## 2022-12-27 DIAGNOSIS — I4891 Unspecified atrial fibrillation: Secondary | ICD-10-CM | POA: Diagnosis not present

## 2022-12-30 DIAGNOSIS — I482 Chronic atrial fibrillation, unspecified: Secondary | ICD-10-CM | POA: Diagnosis not present

## 2022-12-30 DIAGNOSIS — D519 Vitamin B12 deficiency anemia, unspecified: Secondary | ICD-10-CM | POA: Diagnosis not present

## 2022-12-30 DIAGNOSIS — E039 Hypothyroidism, unspecified: Secondary | ICD-10-CM | POA: Diagnosis not present

## 2023-01-05 DIAGNOSIS — G4701 Insomnia due to medical condition: Secondary | ICD-10-CM | POA: Diagnosis not present

## 2023-01-05 DIAGNOSIS — F331 Major depressive disorder, recurrent, moderate: Secondary | ICD-10-CM | POA: Diagnosis not present

## 2023-01-05 DIAGNOSIS — F064 Anxiety disorder due to known physiological condition: Secondary | ICD-10-CM | POA: Diagnosis not present

## 2023-01-05 DIAGNOSIS — F0150 Vascular dementia without behavioral disturbance: Secondary | ICD-10-CM | POA: Diagnosis not present

## 2023-01-07 DIAGNOSIS — F331 Major depressive disorder, recurrent, moderate: Secondary | ICD-10-CM | POA: Diagnosis not present

## 2023-01-21 DIAGNOSIS — M79675 Pain in left toe(s): Secondary | ICD-10-CM | POA: Diagnosis not present

## 2023-01-21 DIAGNOSIS — M79674 Pain in right toe(s): Secondary | ICD-10-CM | POA: Diagnosis not present

## 2023-01-21 DIAGNOSIS — B351 Tinea unguium: Secondary | ICD-10-CM | POA: Diagnosis not present

## 2023-01-27 DIAGNOSIS — I10 Essential (primary) hypertension: Secondary | ICD-10-CM | POA: Diagnosis not present

## 2023-01-27 DIAGNOSIS — D649 Anemia, unspecified: Secondary | ICD-10-CM | POA: Diagnosis not present

## 2023-01-27 DIAGNOSIS — E1169 Type 2 diabetes mellitus with other specified complication: Secondary | ICD-10-CM | POA: Diagnosis not present

## 2023-01-27 DIAGNOSIS — F0390 Unspecified dementia without behavioral disturbance: Secondary | ICD-10-CM | POA: Diagnosis not present

## 2023-01-28 DIAGNOSIS — F331 Major depressive disorder, recurrent, moderate: Secondary | ICD-10-CM | POA: Diagnosis not present

## 2023-02-11 DIAGNOSIS — F331 Major depressive disorder, recurrent, moderate: Secondary | ICD-10-CM | POA: Diagnosis not present

## 2023-02-17 DIAGNOSIS — E559 Vitamin D deficiency, unspecified: Secondary | ICD-10-CM | POA: Diagnosis not present

## 2023-02-17 DIAGNOSIS — F0390 Unspecified dementia without behavioral disturbance: Secondary | ICD-10-CM | POA: Diagnosis not present

## 2023-02-17 DIAGNOSIS — I482 Chronic atrial fibrillation, unspecified: Secondary | ICD-10-CM | POA: Diagnosis not present

## 2023-02-17 DIAGNOSIS — I10 Essential (primary) hypertension: Secondary | ICD-10-CM | POA: Diagnosis not present

## 2023-02-17 DIAGNOSIS — E039 Hypothyroidism, unspecified: Secondary | ICD-10-CM | POA: Diagnosis not present

## 2023-02-18 DIAGNOSIS — F331 Major depressive disorder, recurrent, moderate: Secondary | ICD-10-CM | POA: Diagnosis not present

## 2023-02-18 DIAGNOSIS — G4701 Insomnia due to medical condition: Secondary | ICD-10-CM | POA: Diagnosis not present

## 2023-02-18 DIAGNOSIS — F064 Anxiety disorder due to known physiological condition: Secondary | ICD-10-CM | POA: Diagnosis not present

## 2023-02-18 DIAGNOSIS — F0150 Vascular dementia without behavioral disturbance: Secondary | ICD-10-CM | POA: Diagnosis not present

## 2023-02-25 DIAGNOSIS — F331 Major depressive disorder, recurrent, moderate: Secondary | ICD-10-CM | POA: Diagnosis not present

## 2023-03-11 DIAGNOSIS — F331 Major depressive disorder, recurrent, moderate: Secondary | ICD-10-CM | POA: Diagnosis not present

## 2023-03-14 DIAGNOSIS — F331 Major depressive disorder, recurrent, moderate: Secondary | ICD-10-CM | POA: Diagnosis not present

## 2023-03-14 DIAGNOSIS — F064 Anxiety disorder due to known physiological condition: Secondary | ICD-10-CM | POA: Diagnosis not present

## 2023-03-14 DIAGNOSIS — F0150 Vascular dementia without behavioral disturbance: Secondary | ICD-10-CM | POA: Diagnosis not present

## 2023-03-14 DIAGNOSIS — G4701 Insomnia due to medical condition: Secondary | ICD-10-CM | POA: Diagnosis not present

## 2023-03-17 DIAGNOSIS — D649 Anemia, unspecified: Secondary | ICD-10-CM | POA: Diagnosis not present

## 2023-03-17 DIAGNOSIS — E782 Mixed hyperlipidemia: Secondary | ICD-10-CM | POA: Diagnosis not present

## 2023-03-17 DIAGNOSIS — E1169 Type 2 diabetes mellitus with other specified complication: Secondary | ICD-10-CM | POA: Diagnosis not present

## 2023-03-21 DIAGNOSIS — E1169 Type 2 diabetes mellitus with other specified complication: Secondary | ICD-10-CM | POA: Diagnosis not present

## 2023-03-21 DIAGNOSIS — I482 Chronic atrial fibrillation, unspecified: Secondary | ICD-10-CM | POA: Diagnosis not present

## 2023-03-21 DIAGNOSIS — N183 Chronic kidney disease, stage 3 unspecified: Secondary | ICD-10-CM | POA: Diagnosis not present

## 2023-03-21 DIAGNOSIS — I10 Essential (primary) hypertension: Secondary | ICD-10-CM | POA: Diagnosis not present

## 2023-03-25 DIAGNOSIS — F331 Major depressive disorder, recurrent, moderate: Secondary | ICD-10-CM | POA: Diagnosis not present

## 2023-04-08 DIAGNOSIS — M79675 Pain in left toe(s): Secondary | ICD-10-CM | POA: Diagnosis not present

## 2023-04-08 DIAGNOSIS — F331 Major depressive disorder, recurrent, moderate: Secondary | ICD-10-CM | POA: Diagnosis not present

## 2023-04-08 DIAGNOSIS — M79674 Pain in right toe(s): Secondary | ICD-10-CM | POA: Diagnosis not present

## 2023-04-08 DIAGNOSIS — B351 Tinea unguium: Secondary | ICD-10-CM | POA: Diagnosis not present

## 2023-04-11 DIAGNOSIS — E119 Type 2 diabetes mellitus without complications: Secondary | ICD-10-CM | POA: Diagnosis not present

## 2023-04-11 DIAGNOSIS — I872 Venous insufficiency (chronic) (peripheral): Secondary | ICD-10-CM | POA: Diagnosis not present

## 2023-04-15 DIAGNOSIS — F0150 Vascular dementia without behavioral disturbance: Secondary | ICD-10-CM | POA: Diagnosis not present

## 2023-04-15 DIAGNOSIS — G301 Alzheimer's disease with late onset: Secondary | ICD-10-CM | POA: Diagnosis not present

## 2023-04-15 DIAGNOSIS — F331 Major depressive disorder, recurrent, moderate: Secondary | ICD-10-CM | POA: Diagnosis not present

## 2023-04-15 DIAGNOSIS — F064 Anxiety disorder due to known physiological condition: Secondary | ICD-10-CM | POA: Diagnosis not present

## 2023-04-21 DIAGNOSIS — E039 Hypothyroidism, unspecified: Secondary | ICD-10-CM | POA: Diagnosis not present

## 2023-04-21 DIAGNOSIS — E559 Vitamin D deficiency, unspecified: Secondary | ICD-10-CM | POA: Diagnosis not present

## 2023-04-21 DIAGNOSIS — I482 Chronic atrial fibrillation, unspecified: Secondary | ICD-10-CM | POA: Diagnosis not present

## 2023-04-23 DIAGNOSIS — I10 Essential (primary) hypertension: Secondary | ICD-10-CM | POA: Diagnosis not present

## 2023-04-23 DIAGNOSIS — F0390 Unspecified dementia without behavioral disturbance: Secondary | ICD-10-CM | POA: Diagnosis not present

## 2023-04-29 DIAGNOSIS — F331 Major depressive disorder, recurrent, moderate: Secondary | ICD-10-CM | POA: Diagnosis not present

## 2023-05-13 DIAGNOSIS — F331 Major depressive disorder, recurrent, moderate: Secondary | ICD-10-CM | POA: Diagnosis not present

## 2023-05-14 DIAGNOSIS — F0390 Unspecified dementia without behavioral disturbance: Secondary | ICD-10-CM | POA: Diagnosis not present

## 2023-05-14 DIAGNOSIS — E782 Mixed hyperlipidemia: Secondary | ICD-10-CM | POA: Diagnosis not present

## 2023-05-14 DIAGNOSIS — E1169 Type 2 diabetes mellitus with other specified complication: Secondary | ICD-10-CM | POA: Diagnosis not present

## 2023-05-20 DIAGNOSIS — F331 Major depressive disorder, recurrent, moderate: Secondary | ICD-10-CM | POA: Diagnosis not present

## 2023-05-20 DIAGNOSIS — F0150 Vascular dementia without behavioral disturbance: Secondary | ICD-10-CM | POA: Diagnosis not present

## 2023-05-20 DIAGNOSIS — G301 Alzheimer's disease with late onset: Secondary | ICD-10-CM | POA: Diagnosis not present

## 2023-05-20 DIAGNOSIS — F064 Anxiety disorder due to known physiological condition: Secondary | ICD-10-CM | POA: Diagnosis not present

## 2023-05-24 DIAGNOSIS — F0390 Unspecified dementia without behavioral disturbance: Secondary | ICD-10-CM | POA: Diagnosis not present

## 2023-05-24 DIAGNOSIS — I10 Essential (primary) hypertension: Secondary | ICD-10-CM | POA: Diagnosis not present

## 2023-06-09 DIAGNOSIS — D519 Vitamin B12 deficiency anemia, unspecified: Secondary | ICD-10-CM | POA: Diagnosis not present

## 2023-06-09 DIAGNOSIS — I482 Chronic atrial fibrillation, unspecified: Secondary | ICD-10-CM | POA: Diagnosis not present

## 2023-06-09 DIAGNOSIS — I119 Hypertensive heart disease without heart failure: Secondary | ICD-10-CM | POA: Diagnosis not present

## 2023-06-10 DIAGNOSIS — F331 Major depressive disorder, recurrent, moderate: Secondary | ICD-10-CM | POA: Diagnosis not present

## 2023-06-26 DIAGNOSIS — F331 Major depressive disorder, recurrent, moderate: Secondary | ICD-10-CM | POA: Diagnosis not present

## 2023-06-26 DIAGNOSIS — F0150 Vascular dementia without behavioral disturbance: Secondary | ICD-10-CM | POA: Diagnosis not present

## 2023-06-26 DIAGNOSIS — G301 Alzheimer's disease with late onset: Secondary | ICD-10-CM | POA: Diagnosis not present

## 2023-06-26 DIAGNOSIS — F064 Anxiety disorder due to known physiological condition: Secondary | ICD-10-CM | POA: Diagnosis not present

## 2023-06-27 DIAGNOSIS — I10 Essential (primary) hypertension: Secondary | ICD-10-CM | POA: Diagnosis not present

## 2023-06-27 DIAGNOSIS — F0390 Unspecified dementia without behavioral disturbance: Secondary | ICD-10-CM | POA: Diagnosis not present

## 2023-07-08 DIAGNOSIS — F331 Major depressive disorder, recurrent, moderate: Secondary | ICD-10-CM | POA: Diagnosis not present

## 2023-07-14 DIAGNOSIS — D519 Vitamin B12 deficiency anemia, unspecified: Secondary | ICD-10-CM | POA: Diagnosis not present

## 2023-07-14 DIAGNOSIS — I482 Chronic atrial fibrillation, unspecified: Secondary | ICD-10-CM | POA: Diagnosis not present

## 2023-07-14 DIAGNOSIS — I119 Hypertensive heart disease without heart failure: Secondary | ICD-10-CM | POA: Diagnosis not present

## 2023-07-22 DIAGNOSIS — H6123 Impacted cerumen, bilateral: Secondary | ICD-10-CM | POA: Diagnosis not present

## 2023-07-29 DIAGNOSIS — F064 Anxiety disorder due to known physiological condition: Secondary | ICD-10-CM | POA: Diagnosis not present

## 2023-07-29 DIAGNOSIS — F0150 Vascular dementia without behavioral disturbance: Secondary | ICD-10-CM | POA: Diagnosis not present

## 2023-07-29 DIAGNOSIS — F331 Major depressive disorder, recurrent, moderate: Secondary | ICD-10-CM | POA: Diagnosis not present

## 2023-07-29 DIAGNOSIS — G301 Alzheimer's disease with late onset: Secondary | ICD-10-CM | POA: Diagnosis not present

## 2023-07-31 DIAGNOSIS — E1169 Type 2 diabetes mellitus with other specified complication: Secondary | ICD-10-CM | POA: Diagnosis not present

## 2023-07-31 DIAGNOSIS — I482 Chronic atrial fibrillation, unspecified: Secondary | ICD-10-CM | POA: Diagnosis not present

## 2023-08-04 DIAGNOSIS — H6123 Impacted cerumen, bilateral: Secondary | ICD-10-CM | POA: Diagnosis not present

## 2023-08-08 DIAGNOSIS — I10 Essential (primary) hypertension: Secondary | ICD-10-CM | POA: Diagnosis not present

## 2023-08-08 DIAGNOSIS — E119 Type 2 diabetes mellitus without complications: Secondary | ICD-10-CM | POA: Diagnosis not present

## 2023-08-08 DIAGNOSIS — I4891 Unspecified atrial fibrillation: Secondary | ICD-10-CM | POA: Diagnosis not present

## 2023-08-08 DIAGNOSIS — N183 Chronic kidney disease, stage 3 unspecified: Secondary | ICD-10-CM | POA: Diagnosis not present

## 2023-08-11 DIAGNOSIS — E1169 Type 2 diabetes mellitus with other specified complication: Secondary | ICD-10-CM | POA: Diagnosis not present

## 2023-08-11 DIAGNOSIS — E559 Vitamin D deficiency, unspecified: Secondary | ICD-10-CM | POA: Diagnosis not present

## 2023-08-11 DIAGNOSIS — H6123 Impacted cerumen, bilateral: Secondary | ICD-10-CM | POA: Diagnosis not present

## 2023-08-11 DIAGNOSIS — E782 Mixed hyperlipidemia: Secondary | ICD-10-CM | POA: Diagnosis not present

## 2023-08-19 DIAGNOSIS — F0150 Vascular dementia without behavioral disturbance: Secondary | ICD-10-CM | POA: Diagnosis not present

## 2023-08-19 DIAGNOSIS — G301 Alzheimer's disease with late onset: Secondary | ICD-10-CM | POA: Diagnosis not present

## 2023-08-19 DIAGNOSIS — F331 Major depressive disorder, recurrent, moderate: Secondary | ICD-10-CM | POA: Diagnosis not present

## 2023-08-19 DIAGNOSIS — F064 Anxiety disorder due to known physiological condition: Secondary | ICD-10-CM | POA: Diagnosis not present

## 2023-08-22 DIAGNOSIS — I10 Essential (primary) hypertension: Secondary | ICD-10-CM | POA: Diagnosis not present

## 2023-08-22 DIAGNOSIS — E559 Vitamin D deficiency, unspecified: Secondary | ICD-10-CM | POA: Diagnosis not present

## 2023-08-22 DIAGNOSIS — E039 Hypothyroidism, unspecified: Secondary | ICD-10-CM | POA: Diagnosis not present

## 2023-08-22 DIAGNOSIS — E119 Type 2 diabetes mellitus without complications: Secondary | ICD-10-CM | POA: Diagnosis not present

## 2023-08-22 DIAGNOSIS — E782 Mixed hyperlipidemia: Secondary | ICD-10-CM | POA: Diagnosis not present

## 2023-08-25 DIAGNOSIS — E1169 Type 2 diabetes mellitus with other specified complication: Secondary | ICD-10-CM | POA: Diagnosis not present

## 2023-08-25 DIAGNOSIS — I482 Chronic atrial fibrillation, unspecified: Secondary | ICD-10-CM | POA: Diagnosis not present

## 2023-08-25 DIAGNOSIS — H6123 Impacted cerumen, bilateral: Secondary | ICD-10-CM | POA: Diagnosis not present

## 2023-09-04 DIAGNOSIS — H6122 Impacted cerumen, left ear: Secondary | ICD-10-CM | POA: Diagnosis not present

## 2023-09-08 DIAGNOSIS — I482 Chronic atrial fibrillation, unspecified: Secondary | ICD-10-CM | POA: Diagnosis not present

## 2023-09-08 DIAGNOSIS — E039 Hypothyroidism, unspecified: Secondary | ICD-10-CM | POA: Diagnosis not present

## 2023-09-08 DIAGNOSIS — F0150 Vascular dementia without behavioral disturbance: Secondary | ICD-10-CM | POA: Diagnosis not present

## 2023-09-16 DIAGNOSIS — F331 Major depressive disorder, recurrent, moderate: Secondary | ICD-10-CM | POA: Diagnosis not present

## 2023-09-17 DIAGNOSIS — F331 Major depressive disorder, recurrent, moderate: Secondary | ICD-10-CM | POA: Diagnosis not present

## 2023-09-17 DIAGNOSIS — E119 Type 2 diabetes mellitus without complications: Secondary | ICD-10-CM | POA: Diagnosis not present

## 2023-09-18 DIAGNOSIS — G301 Alzheimer's disease with late onset: Secondary | ICD-10-CM | POA: Diagnosis not present

## 2023-09-18 DIAGNOSIS — F0150 Vascular dementia without behavioral disturbance: Secondary | ICD-10-CM | POA: Diagnosis not present

## 2023-09-18 DIAGNOSIS — F331 Major depressive disorder, recurrent, moderate: Secondary | ICD-10-CM | POA: Diagnosis not present

## 2023-09-18 DIAGNOSIS — F064 Anxiety disorder due to known physiological condition: Secondary | ICD-10-CM | POA: Diagnosis not present

## 2023-09-22 DIAGNOSIS — B351 Tinea unguium: Secondary | ICD-10-CM | POA: Diagnosis not present

## 2023-09-22 DIAGNOSIS — M79675 Pain in left toe(s): Secondary | ICD-10-CM | POA: Diagnosis not present

## 2023-09-30 DIAGNOSIS — F331 Major depressive disorder, recurrent, moderate: Secondary | ICD-10-CM | POA: Diagnosis not present

## 2023-10-06 DIAGNOSIS — E119 Type 2 diabetes mellitus without complications: Secondary | ICD-10-CM | POA: Diagnosis not present

## 2023-10-06 DIAGNOSIS — E782 Mixed hyperlipidemia: Secondary | ICD-10-CM | POA: Diagnosis not present

## 2023-10-06 DIAGNOSIS — D519 Vitamin B12 deficiency anemia, unspecified: Secondary | ICD-10-CM | POA: Diagnosis not present

## 2023-10-14 DIAGNOSIS — G301 Alzheimer's disease with late onset: Secondary | ICD-10-CM | POA: Diagnosis not present

## 2023-10-14 DIAGNOSIS — F0150 Vascular dementia without behavioral disturbance: Secondary | ICD-10-CM | POA: Diagnosis not present

## 2023-10-14 DIAGNOSIS — F331 Major depressive disorder, recurrent, moderate: Secondary | ICD-10-CM | POA: Diagnosis not present

## 2023-10-14 DIAGNOSIS — F064 Anxiety disorder due to known physiological condition: Secondary | ICD-10-CM | POA: Diagnosis not present

## 2023-10-28 DIAGNOSIS — G301 Alzheimer's disease with late onset: Secondary | ICD-10-CM | POA: Diagnosis not present

## 2023-10-29 DIAGNOSIS — F331 Major depressive disorder, recurrent, moderate: Secondary | ICD-10-CM | POA: Diagnosis not present

## 2023-10-29 DIAGNOSIS — E119 Type 2 diabetes mellitus without complications: Secondary | ICD-10-CM | POA: Diagnosis not present

## 2023-11-03 DIAGNOSIS — I482 Chronic atrial fibrillation, unspecified: Secondary | ICD-10-CM | POA: Diagnosis not present

## 2023-11-03 DIAGNOSIS — E039 Hypothyroidism, unspecified: Secondary | ICD-10-CM | POA: Diagnosis not present

## 2023-11-03 DIAGNOSIS — F0150 Vascular dementia without behavioral disturbance: Secondary | ICD-10-CM | POA: Diagnosis not present

## 2023-11-10 DIAGNOSIS — F0150 Vascular dementia without behavioral disturbance: Secondary | ICD-10-CM | POA: Diagnosis not present

## 2023-11-10 DIAGNOSIS — F064 Anxiety disorder due to known physiological condition: Secondary | ICD-10-CM | POA: Diagnosis not present

## 2023-11-10 DIAGNOSIS — G301 Alzheimer's disease with late onset: Secondary | ICD-10-CM | POA: Diagnosis not present

## 2023-11-10 DIAGNOSIS — F331 Major depressive disorder, recurrent, moderate: Secondary | ICD-10-CM | POA: Diagnosis not present

## 2023-11-11 DIAGNOSIS — F331 Major depressive disorder, recurrent, moderate: Secondary | ICD-10-CM | POA: Diagnosis not present

## 2023-11-13 DIAGNOSIS — N183 Chronic kidney disease, stage 3 unspecified: Secondary | ICD-10-CM | POA: Diagnosis not present

## 2023-11-13 DIAGNOSIS — E1122 Type 2 diabetes mellitus with diabetic chronic kidney disease: Secondary | ICD-10-CM | POA: Diagnosis not present

## 2023-11-13 DIAGNOSIS — N390 Urinary tract infection, site not specified: Secondary | ICD-10-CM | POA: Diagnosis not present

## 2023-11-17 DIAGNOSIS — N390 Urinary tract infection, site not specified: Secondary | ICD-10-CM | POA: Diagnosis not present

## 2023-11-17 DIAGNOSIS — R531 Weakness: Secondary | ICD-10-CM | POA: Diagnosis not present

## 2023-11-17 DIAGNOSIS — R4182 Altered mental status, unspecified: Secondary | ICD-10-CM | POA: Diagnosis not present

## 2023-11-18 DIAGNOSIS — F331 Major depressive disorder, recurrent, moderate: Secondary | ICD-10-CM | POA: Diagnosis not present

## 2023-11-21 ENCOUNTER — Encounter (HOSPITAL_COMMUNITY): Payer: Self-pay

## 2023-11-21 ENCOUNTER — Other Ambulatory Visit: Payer: Self-pay

## 2023-11-21 ENCOUNTER — Emergency Department (HOSPITAL_COMMUNITY)
Admission: EM | Admit: 2023-11-21 | Discharge: 2023-11-22 | Disposition: A | Discharge status: Home / Self Care | Payer: Self-pay | Attending: Student | Admitting: Student

## 2023-11-21 DIAGNOSIS — F0390 Unspecified dementia without behavioral disturbance: Secondary | ICD-10-CM | POA: Insufficient documentation

## 2023-11-21 DIAGNOSIS — R627 Adult failure to thrive: Secondary | ICD-10-CM | POA: Insufficient documentation

## 2023-11-21 DIAGNOSIS — Z8616 Personal history of COVID-19: Secondary | ICD-10-CM | POA: Insufficient documentation

## 2023-11-21 DIAGNOSIS — D649 Anemia, unspecified: Secondary | ICD-10-CM | POA: Diagnosis not present

## 2023-11-21 DIAGNOSIS — N289 Disorder of kidney and ureter, unspecified: Secondary | ICD-10-CM | POA: Diagnosis not present

## 2023-11-21 DIAGNOSIS — Z79899 Other long term (current) drug therapy: Secondary | ICD-10-CM | POA: Diagnosis not present

## 2023-11-21 DIAGNOSIS — E119 Type 2 diabetes mellitus without complications: Secondary | ICD-10-CM | POA: Insufficient documentation

## 2023-11-21 DIAGNOSIS — R634 Abnormal weight loss: Secondary | ICD-10-CM | POA: Insufficient documentation

## 2023-11-21 DIAGNOSIS — I10 Essential (primary) hypertension: Secondary | ICD-10-CM | POA: Diagnosis not present

## 2023-11-21 DIAGNOSIS — E1165 Type 2 diabetes mellitus with hyperglycemia: Secondary | ICD-10-CM | POA: Diagnosis not present

## 2023-11-21 DIAGNOSIS — Z7984 Long term (current) use of oral hypoglycemic drugs: Secondary | ICD-10-CM | POA: Diagnosis not present

## 2023-11-21 DIAGNOSIS — Z7982 Long term (current) use of aspirin: Secondary | ICD-10-CM | POA: Diagnosis not present

## 2023-11-21 DIAGNOSIS — I2510 Atherosclerotic heart disease of native coronary artery without angina pectoris: Secondary | ICD-10-CM | POA: Diagnosis not present

## 2023-11-21 DIAGNOSIS — R739 Hyperglycemia, unspecified: Secondary | ICD-10-CM

## 2023-11-21 DIAGNOSIS — E871 Hypo-osmolality and hyponatremia: Secondary | ICD-10-CM | POA: Insufficient documentation

## 2023-11-21 DIAGNOSIS — N179 Acute kidney failure, unspecified: Secondary | ICD-10-CM | POA: Diagnosis not present

## 2023-11-21 LAB — CBC WITH DIFFERENTIAL/PLATELET
Abs Immature Granulocytes: 0.08 10*3/uL — ABNORMAL HIGH (ref 0.00–0.07)
Basophils Absolute: 0.1 10*3/uL (ref 0.0–0.1)
Basophils Relative: 0 %
Eosinophils Absolute: 0.1 10*3/uL (ref 0.0–0.5)
Eosinophils Relative: 1 %
HCT: 31.7 % — ABNORMAL LOW (ref 39.0–52.0)
Hemoglobin: 10 g/dL — ABNORMAL LOW (ref 13.0–17.0)
Immature Granulocytes: 1 %
Lymphocytes Relative: 9 %
Lymphs Abs: 1.1 10*3/uL (ref 0.7–4.0)
MCH: 28.8 pg (ref 26.0–34.0)
MCHC: 31.5 g/dL (ref 30.0–36.0)
MCV: 91.4 fL (ref 80.0–100.0)
Monocytes Absolute: 0.9 10*3/uL (ref 0.1–1.0)
Monocytes Relative: 7 %
Neutro Abs: 10.6 10*3/uL — ABNORMAL HIGH (ref 1.7–7.7)
Neutrophils Relative %: 82 %
Platelets: 245 10*3/uL (ref 150–400)
RBC: 3.47 MIL/uL — ABNORMAL LOW (ref 4.22–5.81)
RDW: 13.6 % (ref 11.5–15.5)
WBC: 12.9 10*3/uL — ABNORMAL HIGH (ref 4.0–10.5)
nRBC: 0 % (ref 0.0–0.2)

## 2023-11-21 LAB — URINALYSIS, ROUTINE W REFLEX MICROSCOPIC
Bilirubin Urine: NEGATIVE
Glucose, UA: NEGATIVE mg/dL
Hgb urine dipstick: NEGATIVE
Ketones, ur: NEGATIVE mg/dL
Nitrite: NEGATIVE
Protein, ur: 30 mg/dL — AB
Specific Gravity, Urine: 1.018 (ref 1.005–1.030)
pH: 5 (ref 5.0–8.0)

## 2023-11-21 LAB — COMPREHENSIVE METABOLIC PANEL WITH GFR
ALT: 23 U/L (ref 0–44)
AST: 26 U/L (ref 15–41)
Albumin: 2.9 g/dL — ABNORMAL LOW (ref 3.5–5.0)
Alkaline Phosphatase: 67 U/L (ref 38–126)
Anion gap: 9 (ref 5–15)
BUN: 37 mg/dL — ABNORMAL HIGH (ref 8–23)
CO2: 22 mmol/L (ref 22–32)
Calcium: 9.1 mg/dL (ref 8.9–10.3)
Chloride: 101 mmol/L (ref 98–111)
Creatinine, Ser: 1.73 mg/dL — ABNORMAL HIGH (ref 0.61–1.24)
GFR, Estimated: 37 mL/min — ABNORMAL LOW (ref >60)
Glucose, Bld: 198 mg/dL — ABNORMAL HIGH (ref 70–99)
Potassium: 4.1 mmol/L (ref 3.5–5.1)
Sodium: 132 mmol/L — ABNORMAL LOW (ref 135–145)
Total Bilirubin: 0.4 mg/dL (ref 0.0–1.2)
Total Protein: 7.4 g/dL (ref 6.5–8.1)

## 2023-11-21 MED ORDER — LACTATED RINGERS IV BOLUS
1000.0000 mL | Freq: Once | INTRAVENOUS | Status: AC
  Administered 2023-11-21: 1000 mL via INTRAVENOUS

## 2023-11-21 MED ORDER — MEMANTINE HCL 10 MG PO TABS
10.0000 mg | ORAL_TABLET | Freq: Two times a day (BID) | ORAL | Status: DC
  Administered 2023-11-21: 10 mg via ORAL
  Filled 2023-11-21: qty 1

## 2023-11-21 NOTE — ED Provider Notes (Signed)
 Care assumed from Dr. Jeannie Milo, patient with dementia and progressive mental decline. He is on antibiotics for UTI. UA is pending, but has renal insufficiency which is new compared to 2021. Plan is to admit if UA still shows infection.  I have reviewed his urinalysis, and my interpretation is no evidence of UTI.  I am discharging him back to his skilled nursing facility with and I am requesting that they encourage him to drink more fluids.  Return if he has any worsening of his symptoms.   Alissa April, MD 11/21/23 [phone]

## 2023-11-21 NOTE — Discharge Instructions (Addendum)
 Encouraged him to drink more fluids.  Return to the emergency department if symptoms are getting worse.

## 2023-11-21 NOTE — ED Provider Triage Note (Signed)
 Emergency Medicine Provider Triage Evaluation Note  William Fisher , a 89 y.o. male  was evaluated in triage.  Pt complains of failure to thrive, weight loss.  Patient lives in memory care unit and nurse petitioner at the facility diagnosed him with a UTI and he has been on Macrobid for the last 7 days.  Family states that he has become progressively more somnolent and altered.  Patient is also suffered a 20 pound weight loss over the last 1 month.  Review of Systems  Positive: Weight loss, altered mental status Negative: Chest pain, shortness of breath, Donnell pain, vomiting  Physical Exam  BP 124/80   Pulse 67   Temp 98.8 F (37.1 C) (Oral)   Resp 18   Ht 5\' 10"  (1.778 m)   Wt 68 kg   SpO2 99%   BMI 21.52 kg/m  Gen:   Awake, no distress   Resp:  Normal effort  MSK:   Moves extremities without difficulty    Medical Decision Making  Medically screening exam initiated at 5:21 PM.  Appropriate orders placed.  Jeanice Millard was informed that the remainder of the evaluation will be completed by another provider, this initial triage assessment does not replace that evaluation, and the importance of remaining in the ED until their evaluation is complete.     Karlyn Overman, MD 11/21/23 [phone]

## 2023-11-21 NOTE — ED Triage Notes (Signed)
 Pt from [facility] BIB by family after visitation. NP at facility wanted him seen due to not eating, and losing 19 lbs in the last month. Pt being treated for bladder infection currently. Pt has dementia

## 2023-11-22 NOTE — ED Provider Notes (Signed)
 [facility] EMERGENCY DEPARTMENT AT [facility] Provider Note  CSN: [id number] Arrival date & time: 11/21/23 1552  Chief Complaint(s) Failure To Thrive  HPI William Fisher is a 89 y.o. male with PMH dementia, T2DM, HTN, HLD, paroxysmal A-fib who presents emergency room for evaluation of failure to thrive and weight loss.  Arrives with multiple family members who state that since getting his new hearing aids he has become progressively more somnolent and he is currently being treated for UTI by his memory care staff.  They were told to present to the emergency department given patient's persistent changes in mental status and given weight loss for further evaluation.  Here in the emergency room, patient is alert and is answering questions but is very hard of hearing.  Does not appear somnolent here in the emergency department.  Family said he is at 20 weight loss over the course of this month as he is not eating.  Additional history unable to be obtained given patient's underlying dementia   Past Medical History Past Medical History:  Diagnosis Date   Coronary artery disease    Dementia (HCC)    Dementia (HCC) 11/22/2020   Diabetes mellitus (HCC)    Diverticulosis    Herpes encephalitis    HOH (hard of hearing)    hearing aides   Hyperlipidemia    Hyponatremia    Lung nodule    Chronic   Memory change 07/13/2014   Orthostatic hypotension    Palpitations    Paroxysmal atrial fibrillation (HCC)    Rhabdomyolysis    Mild   SOB (shortness of breath)    Patient Active Problem List   Diagnosis Date Noted   Dementia (HCC) 11/22/2020   HOH (hard of hearing) 11/22/2020   Acute metabolic encephalopathy 02/26/2020   Pneumonia due to COVID-19 virus 02/26/2020   Essential hypertension 02/26/2020   Memory change 07/13/2014   DIVERTICULITIS-COLON 03/01/2009   HYPERTENSION 09/14/2007   Type II or unspecified type diabetes mellitus without mention of complication, not stated as  uncontrolled 09/11/2007   PAROXYSMAL ATRIAL FIBRILLATION 09/11/2007   PULMONARY NODULE 08/17/2007   Home Medication(s) Prior to Admission medications   Medication Sig Start Date End Date Taking? Authorizing Provider  acetaminophen  (TYLENOL ) 500 MG tablet Take 500-1,000 mg by mouth every 6 (six) hours as needed.    [provider]  aspirin EC 81 MG tablet Take 81 mg by mouth daily. Swallow whole.    [provider]  Cholecalciferol (VITAMIN D3) 50 MCG (2000 UT) TABS Take by mouth.    [provider]  Cyanocobalamin  (B-12) 2500 MCG TABS Take 1 tablet by mouth daily.    [provider]  donepezil  (ARICEPT ) 5 MG tablet Take 5 mg by mouth in the morning.    [provider]  JANUMET 50-1000 MG per tablet Take 1 tablet by mouth 2 (two) times daily with a meal.  06/28/14   [provider]  levothyroxine (SYNTHROID) 50 MCG tablet Take 25 mcg by mouth daily before breakfast.    [provider]  melatonin 5 MG TABS Take 5 mg by mouth.    [provider]  memantine  (NAMENDA ) 10 MG tablet Take 1 tablet (10 mg total) by mouth 2 (two) times daily. 11/22/20   Brian Campanile, MD  Multiple Vitamin (MULTIVITAMIN) capsule Take 1 capsule by mouth daily.    [provider]  Multiple Vitamins-Minerals (CERTAVITE SENIOR PO) Take 1 tablet by mouth daily.    [provider]  Polyethyl Glycol-Propyl Glycol (SYSTANE OP) Apply 1 drop to eye.    [provider]  sertraline (ZOLOFT) 50 MG tablet Take 50 mg by mouth daily.    [provider]                                                                                                                                    Past Surgical History Past Surgical History:  Procedure Laterality Date   HERNIA REPAIR     TONSILLECTOMY     Family History Family History  Problem Relation Age of Onset   Stroke Father    Heart disease Brother    Stroke Brother      Social History Social History   Tobacco Use   Smoking status: Never   Smokeless tobacco: Never  Substance Use Topics   Alcohol use: No   Drug use: No   Allergies Codeine, Guaifenesin, and Zinc sulfate [zinc]  Review of Systems Review of Systems  Constitutional:  Positive for unexpected weight change.  Psychiatric/Behavioral:  Positive for behavioral problems and confusion.     Physical Exam Vital Signs  I have reviewed the triage vital signs BP 110/69   Pulse (!) 112   Temp 98.5 F (36.9 C) (Oral)   Resp 18   Ht 5\' 10"  (1.778 m)   Wt 68 kg   SpO2 93%   BMI 21.52 kg/m   Physical Exam Constitutional:      General: He is not in acute distress.    Appearance: Normal appearance.  HENT:     Head: Normocephalic and atraumatic.     Nose: No congestion or rhinorrhea.  Eyes:     General:        Right eye: No discharge.        Left eye: No discharge.     Extraocular Movements: Extraocular movements intact.     Pupils: Pupils are equal, round, and reactive to light.  Cardiovascular:     Rate and Rhythm: Normal rate and regular rhythm.     Heart sounds: No murmur heard. Pulmonary:     Effort: No respiratory distress.     Breath sounds: No wheezing or rales.  Abdominal:     General: There is no distension.     Tenderness: There is no abdominal tenderness.  Musculoskeletal:        General: Normal range of motion.     Cervical back: Normal range of motion.  Skin:    General: Skin is warm and dry.  Neurological:     General: No focal deficit present.     Mental Status: He is alert.     ED Results and Treatments Labs (all labs ordered are listed, but only abnormal results are displayed) Labs Reviewed  COMPREHENSIVE METABOLIC PANEL WITH GFR - Abnormal; Notable for the following components:      Result Value   Sodium 132 (*)  Glucose, Bld 198 (*)    BUN 37 (*)    Creatinine, Ser 1.73 (*)    Albumin 2.9 (*)    GFR, Estimated 37 (*)    All other  components within normal limits  CBC WITH DIFFERENTIAL/PLATELET - Abnormal; Notable for the following components:   WBC 12.9 (*)    RBC 3.47 (*)    Hemoglobin 10.0 (*)    HCT 31.7 (*)    Neutro Abs 10.6 (*)    Abs Immature Granulocytes 0.08 (*)    All other components within normal limits  URINALYSIS, ROUTINE W REFLEX MICROSCOPIC - Abnormal; Notable for the following components:   APPearance HAZY (*)    Protein, ur 30 (*)    Leukocytes,Ua MODERATE (*)    Bacteria, UA RARE (*)    All other components within normal limits                                                                                                                          Radiology No results found.  Pertinent labs & imaging results that were available during my care of the patient were reviewed by me and considered in my medical decision making (see MDM for details).  Medications Ordered in ED Medications  memantine  (NAMENDA ) tablet 10 mg (10 mg Oral Given 11/21/23 2117)  lactated ringers bolus 1,000 mL (0 mLs Intravenous Stopped 11/22/23 0016)                                                                                                                                     Procedures Procedures  (including critical care time)  Medical Decision Making / ED Course   This patient presents to the ED for concern of altered mental status, weight loss, this involves an extensive number of treatment options, and is a complaint that carries with it a high risk of complications and morbidity.  The differential diagnosis includes infection, metabolic/toxic encephalopathy, hypoglycemia, malperfusion, hypoxia, trauma or other intracranial process  MDM: Patient seen emergency room for evaluation of altered mental status.  Physical exam is largely unremarkable with no appreciable tenderness in the abdomen, moving all 4 extremities and answering questions without difficulty.  Laboratory evaluation with a leukocytosis to 12.9  hemoglobin 0.0, BUN 37, creatinine 1.73 but is otherwise unremarkable.  Patient pending urinalysis at time of signout.  I had an extensive discussion with the  patient's family about the patient's weight loss and although malignancy remains on the differential, a given patient's age and advanced dementia even if they did find malignancy they would not pursue treatment and thus we did agree to forego CT evaluation for metastatic disease in the setting of weight loss.  Pending urinalysis at time of signout.  Please see provider signout continuation of workup.   Additional history obtained: -Additional history obtained from multiple family members -External records from outside source obtained and reviewed including: Chart review including previous notes, labs, imaging, consultation notes   Lab Tests: -I ordered, reviewed, and interpreted labs.   The pertinent results include:   Labs Reviewed  COMPREHENSIVE METABOLIC PANEL WITH GFR - Abnormal; Notable for the following components:      Result Value   Sodium 132 (*)    Glucose, Bld 198 (*)    BUN 37 (*)    Creatinine, Ser 1.73 (*)    Albumin 2.9 (*)    GFR, Estimated 37 (*)    All other components within normal limits  CBC WITH DIFFERENTIAL/PLATELET - Abnormal; Notable for the following components:   WBC 12.9 (*)    RBC 3.47 (*)    Hemoglobin 10.0 (*)    HCT 31.7 (*)    Neutro Abs 10.6 (*)    Abs Immature Granulocytes 0.08 (*)    All other components within normal limits  URINALYSIS, ROUTINE W REFLEX MICROSCOPIC - Abnormal; Notable for the following components:   APPearance HAZY (*)    Protein, ur 30 (*)    Leukocytes,Ua MODERATE (*)    Bacteria, UA RARE (*)    All other components within normal limits     Medicines ordered and prescription drug management: Meds ordered this encounter  Medications   memantine  (NAMENDA ) tablet 10 mg   lactated ringers bolus 1,000 mL    -I have reviewed the patients home medicines and have made  adjustments as needed  Critical interventions none    Cardiac Monitoring: The patient was maintained on a cardiac monitor.  I personally viewed and interpreted the cardiac monitored which showed an underlying rhythm of: NSR  Social Determinants of Health:  Factors impacting patients care include: Lives in skilled nursing facility   Reevaluation: After the interventions noted above, I reevaluated the patient and found that they have :stayed the same  Co morbidities that complicate the patient evaluation  Past Medical History:  Diagnosis Date   Coronary artery disease    Dementia (HCC)    Dementia (HCC) 11/22/2020   Diabetes mellitus (HCC)    Diverticulosis    Herpes encephalitis    HOH (hard of hearing)    hearing aides   Hyperlipidemia    Hyponatremia    Lung nodule    Chronic   Memory change 07/13/2014   Orthostatic hypotension    Palpitations    Paroxysmal atrial fibrillation (HCC)    Rhabdomyolysis    Mild   SOB (shortness of breath)       Dispostion: I considered admission for this patient, and disposition pending urinalysis.  Please see provider signout for continuation of workup.     Final Clinical Impression(s) / ED Diagnoses Final diagnoses:  Failure to thrive in adult  Renal insufficiency  Hyponatremia  Normochromic normocytic anemia  Elevated random blood glucose level     @PCDICTATION @    Arlenne Kimbley, Alyse July, MD 11/22/23 [phone]

## 2023-11-24 DIAGNOSIS — N289 Disorder of kidney and ureter, unspecified: Secondary | ICD-10-CM | POA: Diagnosis not present

## 2023-11-24 DIAGNOSIS — R627 Adult failure to thrive: Secondary | ICD-10-CM | POA: Diagnosis not present

## 2023-11-24 DIAGNOSIS — N390 Urinary tract infection, site not specified: Secondary | ICD-10-CM | POA: Diagnosis not present

## 2023-11-24 DIAGNOSIS — E871 Hypo-osmolality and hyponatremia: Secondary | ICD-10-CM | POA: Diagnosis not present

## 2023-11-25 DIAGNOSIS — G301 Alzheimer's disease with late onset: Secondary | ICD-10-CM | POA: Diagnosis not present

## 2023-11-25 DIAGNOSIS — F331 Major depressive disorder, recurrent, moderate: Secondary | ICD-10-CM | POA: Diagnosis not present

## 2023-11-26 DIAGNOSIS — N289 Disorder of kidney and ureter, unspecified: Secondary | ICD-10-CM | POA: Diagnosis not present

## 2023-11-26 DIAGNOSIS — N390 Urinary tract infection, site not specified: Secondary | ICD-10-CM | POA: Diagnosis not present

## 2023-11-28 DIAGNOSIS — E559 Vitamin D deficiency, unspecified: Secondary | ICD-10-CM | POA: Diagnosis not present

## 2023-11-28 DIAGNOSIS — D649 Anemia, unspecified: Secondary | ICD-10-CM | POA: Diagnosis not present

## 2023-11-28 DIAGNOSIS — F0150 Vascular dementia without behavioral disturbance: Secondary | ICD-10-CM | POA: Diagnosis not present

## 2023-11-28 DIAGNOSIS — N289 Disorder of kidney and ureter, unspecified: Secondary | ICD-10-CM | POA: Diagnosis not present

## 2023-11-28 DIAGNOSIS — I10 Essential (primary) hypertension: Secondary | ICD-10-CM | POA: Diagnosis not present

## 2023-11-28 DIAGNOSIS — E119 Type 2 diabetes mellitus without complications: Secondary | ICD-10-CM | POA: Diagnosis not present

## 2023-11-28 DIAGNOSIS — R627 Adult failure to thrive: Secondary | ICD-10-CM | POA: Diagnosis not present

## 2023-11-28 DIAGNOSIS — Z79899 Other long term (current) drug therapy: Secondary | ICD-10-CM | POA: Diagnosis not present

## 2023-11-30 ENCOUNTER — Emergency Department (HOSPITAL_COMMUNITY)

## 2023-11-30 ENCOUNTER — Other Ambulatory Visit: Payer: Self-pay

## 2023-11-30 ENCOUNTER — Inpatient Hospital Stay (HOSPITAL_COMMUNITY)
Admission: EM | Admit: 2023-11-30 | Discharge: 2023-12-03 | DRG: 872 | Disposition: A | Discharge status: Skilled Nursing Facility | Source: Skilled Nursing Facility | Attending: Family Medicine | Admitting: Family Medicine

## 2023-11-30 ENCOUNTER — Encounter (HOSPITAL_COMMUNITY): Payer: Self-pay

## 2023-11-30 DIAGNOSIS — R410 Disorientation, unspecified: Secondary | ICD-10-CM | POA: Diagnosis not present

## 2023-11-30 DIAGNOSIS — I2510 Atherosclerotic heart disease of native coronary artery without angina pectoris: Secondary | ICD-10-CM | POA: Diagnosis not present

## 2023-11-30 DIAGNOSIS — Z87440 Personal history of urinary (tract) infections: Secondary | ICD-10-CM

## 2023-11-30 DIAGNOSIS — A419 Sepsis, unspecified organism: Principal | ICD-10-CM | POA: Diagnosis present

## 2023-11-30 DIAGNOSIS — E872 Acidosis, unspecified: Secondary | ICD-10-CM | POA: Diagnosis not present

## 2023-11-30 DIAGNOSIS — I951 Orthostatic hypotension: Principal | ICD-10-CM | POA: Diagnosis present

## 2023-11-30 DIAGNOSIS — N390 Urinary tract infection, site not specified: Secondary | ICD-10-CM | POA: Diagnosis not present

## 2023-11-30 DIAGNOSIS — N179 Acute kidney failure, unspecified: Secondary | ICD-10-CM | POA: Diagnosis not present

## 2023-11-30 DIAGNOSIS — Z515 Encounter for palliative care: Secondary | ICD-10-CM | POA: Diagnosis not present

## 2023-11-30 DIAGNOSIS — Z1152 Encounter for screening for COVID-19: Secondary | ICD-10-CM

## 2023-11-30 DIAGNOSIS — S0990XA Unspecified injury of head, initial encounter: Secondary | ICD-10-CM | POA: Diagnosis present

## 2023-11-30 DIAGNOSIS — Z043 Encounter for examination and observation following other accident: Secondary | ICD-10-CM | POA: Diagnosis not present

## 2023-11-30 DIAGNOSIS — E785 Hyperlipidemia, unspecified: Secondary | ICD-10-CM | POA: Diagnosis not present

## 2023-11-30 DIAGNOSIS — R6510 Systemic inflammatory response syndrome (SIRS) of non-infectious origin without acute organ dysfunction: Principal | ICD-10-CM | POA: Diagnosis present

## 2023-11-30 DIAGNOSIS — I10 Essential (primary) hypertension: Secondary | ICD-10-CM | POA: Diagnosis present

## 2023-11-30 DIAGNOSIS — Z79899 Other long term (current) drug therapy: Secondary | ICD-10-CM

## 2023-11-30 DIAGNOSIS — Z79890 Hormone replacement therapy (postmenopausal): Secondary | ICD-10-CM

## 2023-11-30 DIAGNOSIS — E8721 Acute metabolic acidosis: Secondary | ICD-10-CM | POA: Diagnosis not present

## 2023-11-30 DIAGNOSIS — E1165 Type 2 diabetes mellitus with hyperglycemia: Secondary | ICD-10-CM | POA: Diagnosis present

## 2023-11-30 DIAGNOSIS — M6282 Rhabdomyolysis: Secondary | ICD-10-CM | POA: Diagnosis not present

## 2023-11-30 DIAGNOSIS — R911 Solitary pulmonary nodule: Secondary | ICD-10-CM | POA: Diagnosis present

## 2023-11-30 DIAGNOSIS — G9389 Other specified disorders of brain: Secondary | ICD-10-CM | POA: Diagnosis not present

## 2023-11-30 DIAGNOSIS — Z888 Allergy status to other drugs, medicaments and biological substances status: Secondary | ICD-10-CM

## 2023-11-30 DIAGNOSIS — H9190 Unspecified hearing loss, unspecified ear: Secondary | ICD-10-CM | POA: Diagnosis present

## 2023-11-30 DIAGNOSIS — E876 Hypokalemia: Secondary | ICD-10-CM | POA: Diagnosis not present

## 2023-11-30 DIAGNOSIS — Z66 Do not resuscitate: Secondary | ICD-10-CM | POA: Diagnosis not present

## 2023-11-30 DIAGNOSIS — Z7984 Long term (current) use of oral hypoglycemic drugs: Secondary | ICD-10-CM | POA: Diagnosis not present

## 2023-11-30 DIAGNOSIS — E871 Hypo-osmolality and hyponatremia: Secondary | ICD-10-CM | POA: Diagnosis present

## 2023-11-30 DIAGNOSIS — K8020 Calculus of gallbladder without cholecystitis without obstruction: Secondary | ICD-10-CM | POA: Diagnosis not present

## 2023-11-30 DIAGNOSIS — R000 Tachycardia, unspecified: Secondary | ICD-10-CM | POA: Diagnosis present

## 2023-11-30 DIAGNOSIS — W19XXXA Unspecified fall, initial encounter: Secondary | ICD-10-CM | POA: Diagnosis present

## 2023-11-30 DIAGNOSIS — K5730 Diverticulosis of large intestine without perforation or abscess without bleeding: Secondary | ICD-10-CM | POA: Diagnosis not present

## 2023-11-30 DIAGNOSIS — Z7982 Long term (current) use of aspirin: Secondary | ICD-10-CM | POA: Diagnosis not present

## 2023-11-30 DIAGNOSIS — F05 Delirium due to known physiological condition: Secondary | ICD-10-CM | POA: Diagnosis not present

## 2023-11-30 DIAGNOSIS — I4891 Unspecified atrial fibrillation: Secondary | ICD-10-CM | POA: Diagnosis present

## 2023-11-30 DIAGNOSIS — E860 Dehydration: Secondary | ICD-10-CM | POA: Diagnosis not present

## 2023-11-30 DIAGNOSIS — I6529 Occlusion and stenosis of unspecified carotid artery: Secondary | ICD-10-CM | POA: Diagnosis not present

## 2023-11-30 DIAGNOSIS — Z8249 Family history of ischemic heart disease and other diseases of the circulatory system: Secondary | ICD-10-CM

## 2023-11-30 DIAGNOSIS — I480 Paroxysmal atrial fibrillation: Secondary | ICD-10-CM | POA: Diagnosis not present

## 2023-11-30 DIAGNOSIS — Z885 Allergy status to narcotic agent status: Secondary | ICD-10-CM

## 2023-11-30 DIAGNOSIS — S0291XA Unspecified fracture of skull, initial encounter for closed fracture: Secondary | ICD-10-CM | POA: Diagnosis not present

## 2023-11-30 DIAGNOSIS — G301 Alzheimer's disease with late onset: Secondary | ICD-10-CM | POA: Diagnosis not present

## 2023-11-30 DIAGNOSIS — R627 Adult failure to thrive: Secondary | ICD-10-CM | POA: Diagnosis not present

## 2023-11-30 DIAGNOSIS — F0390 Unspecified dementia without behavioral disturbance: Secondary | ICD-10-CM | POA: Diagnosis not present

## 2023-11-30 DIAGNOSIS — F02B0 Dementia in other diseases classified elsewhere, moderate, without behavioral disturbance, psychotic disturbance, mood disturbance, and anxiety: Secondary | ICD-10-CM | POA: Diagnosis not present

## 2023-11-30 DIAGNOSIS — Z7189 Other specified counseling: Secondary | ICD-10-CM | POA: Diagnosis not present

## 2023-11-30 DIAGNOSIS — Z823 Family history of stroke: Secondary | ICD-10-CM

## 2023-11-30 LAB — CBC WITH DIFFERENTIAL/PLATELET
Abs Immature Granulocytes: 0.07 10*3/uL (ref 0.00–0.07)
Basophils Absolute: 0.1 10*3/uL (ref 0.0–0.1)
Basophils Relative: 0 %
Eosinophils Absolute: 0 10*3/uL (ref 0.0–0.5)
Eosinophils Relative: 0 %
HCT: 29.1 % — ABNORMAL LOW (ref 39.0–52.0)
Hemoglobin: 9.5 g/dL — ABNORMAL LOW (ref 13.0–17.0)
Immature Granulocytes: 1 %
Lymphocytes Relative: 7 %
Lymphs Abs: 1 10*3/uL (ref 0.7–4.0)
MCH: 29.3 pg (ref 26.0–34.0)
MCHC: 32.6 g/dL (ref 30.0–36.0)
MCV: 89.8 fL (ref 80.0–100.0)
Monocytes Absolute: 1.1 10*3/uL — ABNORMAL HIGH (ref 0.1–1.0)
Monocytes Relative: 8 %
Neutro Abs: 12.1 10*3/uL — ABNORMAL HIGH (ref 1.7–7.7)
Neutrophils Relative %: 84 %
Platelets: 228 10*3/uL (ref 150–400)
RBC: 3.24 MIL/uL — ABNORMAL LOW (ref 4.22–5.81)
RDW: 13.9 % (ref 11.5–15.5)
WBC: 14.4 10*3/uL — ABNORMAL HIGH (ref 4.0–10.5)
nRBC: 0 % (ref 0.0–0.2)

## 2023-11-30 LAB — GLUCOSE, CAPILLARY
Glucose-Capillary: 139 mg/dL — ABNORMAL HIGH (ref 70–99)
Glucose-Capillary: 114 mg/dL — ABNORMAL HIGH (ref 70–99)

## 2023-11-30 LAB — HEMOGLOBIN A1C
Hgb A1c MFr Bld: 7.7 % — ABNORMAL HIGH (ref 4.8–5.6)
Mean Plasma Glucose: 174.29 mg/dL

## 2023-11-30 LAB — RESP PANEL BY RT-PCR (RSV, FLU A&B, COVID)  RVPGX2
Influenza A by PCR: NEGATIVE
Influenza B by PCR: NEGATIVE
Resp Syncytial Virus by PCR: NEGATIVE
SARS Coronavirus 2 by RT PCR: NEGATIVE

## 2023-11-30 LAB — COMPREHENSIVE METABOLIC PANEL WITH GFR
ALT: 27 U/L (ref 0–44)
AST: 38 U/L (ref 15–41)
Albumin: 2.6 g/dL — ABNORMAL LOW (ref 3.5–5.0)
Alkaline Phosphatase: 71 U/L (ref 38–126)
Anion gap: 14 (ref 5–15)
BUN: 36 mg/dL — ABNORMAL HIGH (ref 8–23)
CO2: 20 mmol/L — ABNORMAL LOW (ref 22–32)
Calcium: 9.3 mg/dL (ref 8.9–10.3)
Chloride: 100 mmol/L (ref 98–111)
Creatinine, Ser: 1.94 mg/dL — ABNORMAL HIGH (ref 0.61–1.24)
GFR, Estimated: 32 mL/min — ABNORMAL LOW (ref >60)
Glucose, Bld: 179 mg/dL — ABNORMAL HIGH (ref 70–99)
Potassium: 4.4 mmol/L (ref 3.5–5.1)
Sodium: 134 mmol/L — ABNORMAL LOW (ref 135–145)
Total Bilirubin: 0.4 mg/dL (ref 0.0–1.2)
Total Protein: 7 g/dL (ref 6.5–8.1)

## 2023-11-30 LAB — PROTIME-INR
INR: 1.3 — ABNORMAL HIGH (ref 0.8–1.2)
Prothrombin Time: 16 s — ABNORMAL HIGH (ref 11.4–15.2)

## 2023-11-30 LAB — URINALYSIS, W/ REFLEX TO CULTURE (INFECTION SUSPECTED)
Bilirubin Urine: NEGATIVE
Glucose, UA: NEGATIVE mg/dL
Hgb urine dipstick: NEGATIVE
Ketones, ur: NEGATIVE mg/dL
Leukocytes,Ua: NEGATIVE
Nitrite: NEGATIVE
Protein, ur: 30 mg/dL — AB
Specific Gravity, Urine: 1.019 (ref 1.005–1.030)
pH: 5 (ref 5.0–8.0)

## 2023-11-30 LAB — CK: Total CK: 112 U/L (ref 49–397)

## 2023-11-30 LAB — TSH: TSH: 4.099 u[IU]/mL (ref 0.350–4.500)

## 2023-11-30 LAB — LACTIC ACID, PLASMA
Lactic Acid, Venous: 4.1 mmol/L (ref 0.5–1.9)
Lactic Acid, Venous: 2.5 mmol/L (ref 0.5–1.9)

## 2023-11-30 LAB — LIPASE, BLOOD: Lipase: 37 U/L (ref 11–51)

## 2023-11-30 MED ORDER — ACETAMINOPHEN 325 MG PO TABS
650.0000 mg | ORAL_TABLET | Freq: Four times a day (QID) | ORAL | Status: DC | PRN
  Administered 2023-12-01: 650 mg via ORAL
  Filled 2023-11-30: qty 2

## 2023-11-30 MED ORDER — LACTATED RINGERS IV BOLUS: 1000.0000 mL | Freq: Once | INTRAVENOUS | Status: DC

## 2023-11-30 MED ORDER — INSULIN ASPART 100 UNIT/ML IJ SOLN: 0.0000 [IU] | Freq: Every day | INTRAMUSCULAR | Status: DC

## 2023-11-30 MED ORDER — ASPIRIN 81 MG PO TBEC
81.0000 mg | DELAYED_RELEASE_TABLET | Freq: Every day | ORAL | Status: DC
  Administered 2023-12-02: 81 mg via ORAL
  Filled 2023-11-30 (×2): qty 1

## 2023-11-30 MED ORDER — ACETAMINOPHEN 500 MG PO TABS
1000.0000 mg | ORAL_TABLET | Freq: Once | ORAL | Status: AC
  Administered 2023-11-30: 1000 mg via ORAL
  Filled 2023-11-30: qty 2

## 2023-11-30 MED ORDER — ACETAMINOPHEN 650 MG RE SUPP
650.0000 mg | Freq: Four times a day (QID) | RECTAL | Status: DC | PRN
Start: 2023-11-30 — End: 2023-12-03

## 2023-11-30 MED ORDER — LORAZEPAM 2 MG/ML IJ SOLN: 0.5000 mg | Freq: Once | INTRAMUSCULAR | Status: DC

## 2023-11-30 MED ORDER — SODIUM CHLORIDE 0.9 % IV SOLN
2.0000 g | Freq: Once | INTRAVENOUS | Status: AC
  Administered 2023-11-30: 2 g via INTRAVENOUS
  Filled 2023-11-30: qty 20

## 2023-11-30 MED ORDER — ONDANSETRON HCL 4 MG/2ML IJ SOLN
4.0000 mg | Freq: Four times a day (QID) | INTRAMUSCULAR | Status: DC | PRN
Start: 2023-11-30 — End: 2023-12-03

## 2023-11-30 MED ORDER — MEMANTINE HCL 10 MG PO TABS
10.0000 mg | ORAL_TABLET | Freq: Two times a day (BID) | ORAL | Status: DC
  Administered 2023-11-30 – 2023-12-02 (×5): 10 mg via ORAL
  Filled 2023-11-30 (×6): qty 1

## 2023-11-30 MED ORDER — SODIUM CHLORIDE 0.9 % IV SOLN
2.0000 g | INTRAVENOUS | Status: DC
  Administered 2023-12-01 – 2023-12-02 (×2): 2 g via INTRAVENOUS
  Filled 2023-11-30 (×2): qty 20

## 2023-11-30 MED ORDER — ONDANSETRON HCL 4 MG PO TABS: 4.0000 mg | ORAL_TABLET | Freq: Four times a day (QID) | ORAL | Status: DC | PRN

## 2023-11-30 MED ORDER — INSULIN ASPART 100 UNIT/ML IJ SOLN
0.0000 [IU] | Freq: Three times a day (TID) | INTRAMUSCULAR | Status: DC
  Administered 2023-11-30: 1 [IU] via SUBCUTANEOUS

## 2023-11-30 MED ORDER — SODIUM CHLORIDE 0.9 % IV SOLN: INTRAVENOUS | Status: DC

## 2023-11-30 MED ORDER — LACTATED RINGERS IV SOLN
INTRAVENOUS | Status: DC
Start: 2023-11-30 — End: 2023-11-30

## 2023-11-30 MED ORDER — LEVOTHYROXINE SODIUM 50 MCG PO TABS: 25.0000 ug | ORAL_TABLET | Freq: Every day | ORAL | Status: DC

## 2023-11-30 MED ORDER — HEPARIN SODIUM (PORCINE) 5000 UNIT/ML IJ SOLN
5000.0000 [IU] | Freq: Three times a day (TID) | INTRAMUSCULAR | Status: DC
  Administered 2023-11-30 – 2023-12-02 (×6): 5000 [IU] via SUBCUTANEOUS
  Filled 2023-11-30 (×8): qty 1

## 2023-11-30 MED ORDER — LACTATED RINGERS IV BOLUS: 600.0000 mL | Freq: Once | INTRAVENOUS | Status: DC

## 2023-11-30 MED ORDER — LACTATED RINGERS IV BOLUS
1600.0000 mL | Freq: Once | INTRAVENOUS | Status: AC
  Administered 2023-11-30: 1600 mL via INTRAVENOUS

## 2023-11-30 MED ORDER — SERTRALINE HCL 50 MG PO TABS
50.0000 mg | ORAL_TABLET | Freq: Every day | ORAL | Status: DC
  Administered 2023-12-01 – 2023-12-02 (×2): 50 mg via ORAL
  Filled 2023-11-30 (×3): qty 1

## 2023-11-30 MED ORDER — LACTATED RINGERS IV BOLUS
500.0000 mL | Freq: Once | INTRAVENOUS | Status: AC
  Administered 2023-11-30: 500 mL via INTRAVENOUS

## 2023-11-30 MED ORDER — HALOPERIDOL LACTATE 5 MG/ML IJ SOLN
5.0000 mg | Freq: Once | INTRAMUSCULAR | Status: AC
  Administered 2023-11-30: 5 mg via INTRAVENOUS
  Filled 2023-11-30: qty 1

## 2023-11-30 MED ORDER — CHLORHEXIDINE GLUCONATE CLOTH 2 % EX PADS
6.0000 | MEDICATED_PAD | Freq: Every day | CUTANEOUS | Status: DC
  Administered 2023-12-01 – 2023-12-03 (×3): 6 via TOPICAL

## 2023-11-30 MED ORDER — IOHEXOL 300 MG/ML  SOLN
80.0000 mL | Freq: Once | INTRAMUSCULAR | Status: AC | PRN
  Administered 2023-11-30: 80 mL via INTRAVENOUS

## 2023-11-30 MED ORDER — DONEPEZIL HCL 5 MG PO TABS
5.0000 mg | ORAL_TABLET | Freq: Every morning | ORAL | Status: DC
  Administered 2023-12-02: 5 mg via ORAL
  Filled 2023-11-30 (×2): qty 1

## 2023-11-30 NOTE — ED Provider Notes (Signed)
 [place] EMERGENCY DEPARTMENT AT [facility] Provider Note   CSN: [id number] Arrival date & time: 11/30/23  1028     History  No chief complaint on file.   William Fisher is a 89 y.o. male.  HPI   89 year old male presents emergency department from [facility] assisted [facility] facility after a fall.  Patient went to breakfast this morning with the other residents.  After breakfast, patient wandered into another resident's room.  Was found on the ground by the resident before assistance was called.  Nursing staff thinks the patient was only on the ground for about 10 minutes or so before he was found.  Does report trauma to head but no loss of consciousness.  Per the nursing staff, was not complaining of any pain at time of fall but EMS was called given fall in the setting of head injury.  Nursing staff does states that patient has had gradual decline over the past several weeks.  Reports more restricted food/liquid intake.  He denies any acute change over the past few days/week.  He denies any reported fever, vomiting, urinary symptoms, change in bowel habits, cough.  Difficult to obtain history from patient as he will only shake his head yes or no to certain questions but would not respond to every question will respond in detail.  Per [facility] staff, this is his baseline.  Patient does shake his head no when asked if he is having any pain anywhere.  Past medical history significant for dementia, diabetes mellitus, hyperlipidemia, herpes encephalitis, orthostatic hypotension, paroxysmal atrial fibrillation not currently on anticoagulation but does take a daily aspirin, rhabdomyolysis, hypertension, metabolic encephalopathy  Home Medications Prior to Admission medications   Medication Sig Start Date End Date Taking? Authorizing Provider  acetaminophen  (TYLENOL ) 500 MG tablet Take 500-1,000 mg by mouth every 6 (six) hours as needed.    [provider]  aspirin EC 81 MG  tablet Take 81 mg by mouth daily. Swallow whole.    [provider]  Cholecalciferol (VITAMIN D3) 50 MCG (2000 UT) TABS Take by mouth.    [provider]  Cyanocobalamin  (B-12) 2500 MCG TABS Take 1 tablet by mouth daily.    [provider]  donepezil  (ARICEPT ) 5 MG tablet Take 5 mg by mouth in the morning.    [provider]  JANUMET 50-1000 MG per tablet Take 1 tablet by mouth 2 (two) times daily with a meal.  06/28/14   [provider]  levothyroxine (SYNTHROID) 50 MCG tablet Take 25 mcg by mouth daily before breakfast.    [provider]  melatonin 5 MG TABS Take 5 mg by mouth.    [provider]  memantine  (NAMENDA ) 10 MG tablet Take 1 tablet (10 mg total) by mouth 2 (two) times daily. 11/22/20   Brian Campanile, MD  Multiple Vitamin (MULTIVITAMIN) capsule Take 1 capsule by mouth daily.    [provider]  Multiple Vitamins-Minerals (CERTAVITE SENIOR PO) Take 1 tablet by mouth daily.    [provider]  Polyethyl Glycol-Propyl Glycol (SYSTANE OP) Apply 1 drop to eye.    [provider]  sertraline (ZOLOFT) 50 MG tablet Take 50 mg by mouth daily.    [provider]      Allergies    Codeine, Guaifenesin, and Zinc sulfate [zinc]    Review of Systems   Review of Systems  All other systems reviewed and are negative.   Physical Exam Updated Vital Signs There  were no vitals taken for this visit. Physical Exam Vitals and nursing note reviewed.  Constitutional:      General: He is not in acute distress.    Comments: Patient awake and alert.  Will shake his head yes and no to certain questions.  Began to push nurses when attempted to be rolled over because he did not want to.  Will not respond in full sentences to questioning.  HENT:     Head: Normocephalic and atraumatic.  Eyes:     Conjunctiva/sclera: Conjunctivae normal.  Cardiovascular:     Rate and Rhythm: Normal rate and regular  rhythm.     Heart sounds: No murmur heard. Pulmonary:     Effort: Pulmonary effort is normal. No respiratory distress.     Breath sounds: Normal breath sounds.  Abdominal:     Palpations: Abdomen is soft.     Tenderness: There is no abdominal tenderness.  Musculoskeletal:        General: No swelling.     Cervical back: Neck supple.     Comments: No obvious pain response elicited with palpation midline of cervical spine, thoracic spine, lumbar spine.  No obvious step-off deformity.  No obvious chest wall tenderness.  No other areas of healing hematoma bilateral upper extremities without any acute hematoma or trauma appreciated.  Able to range arms and legs fully without obvious pain.  Skin:    General: Skin is warm and dry.     Capillary Refill: Capillary refill takes less than 2 seconds.     Comments: No evidence of skin breakdown full-body exam.  Neurological:     Mental Status: He is alert.     Comments: Patient will not respond to who he is, where he is, time, cannot describe evidence of fall.  Speech is fluent, clear without dysarthria or dysphasia.   Patient moves all 4 extremities without difficulty. Unable to assess for sensation secondary to patient noncompliance  Gait not assessed  CN I not tested  CN II not tested CN III, IV, VI PERRLA and EOMs intact bilaterally  CN V unable to assess facial sensation secondary to patient's noncompliance CN VII facial movements symmetric  CN VIII not tested  CN IX, X no uvula deviation, symmetric rise of soft palate  CN XI patient shrugs shoulders symmetrically. CN XII unable to assess as patient would not open mouth for rest.    Psychiatric:        Mood and Affect: Mood normal.    ED Results / Procedures / Treatments   Labs (all labs ordered are listed, but only abnormal results are displayed) Labs Reviewed - No data to display  EKG None  Radiology No results found.  Procedures .Critical Care  Performed by: Eldred Butter, PA Authorized by: [place] Butter, PA   Critical care provider statement:    Critical care time (minutes):  48   Critical care was necessary to treat or prevent imminent or life-threatening deterioration of the following conditions:  Sepsis   Critical care was time spent personally by me on the following activities:  Development of treatment plan with patient or surrogate, discussions with consultants, evaluation of patient's response to treatment, examination of patient, ordering and review of laboratory studies, ordering and review of radiographic studies, ordering and performing treatments and interventions, pulse oximetry, re-evaluation of patient's condition and review of old charts   I assumed direction of critical care for this patient from another provider in my specialty: no  Care discussed with: admitting provider       Medications Ordered in ED Medications - No data to display  ED Course/ Medical Decision Making/ A&P Clinical Course as of 11/30/23 1448  Sun Nov 30, 2023  1056 Contacted nursing staff at [facility] assisted [facility] facility.  Patient had a fall when he went into the wrong room after breakfast earlier this morning.  Was only on the ground for about 10 minutes or so before he was found by the resident his room that was.  Was assisted back up to his feet and EMS was called.  Does report hitting his head but no LOC.  No blood thinner use at baseline.  Patient was complaining of no pain at time of fall. [CR]  1058 Consulted attending physician Dr. Lewis Red regarding patient given patient's age, meeting SIRS criteria with fall as history.  Recommendation was empirically treating with Rocephin 2 g.  Will obtain sepsis workup. [CR]  1205 UA without obvious infection.  Imaging studies negative for any obvious infectious etiology.  Viral testing negative.  Will obtain CT imaging of patient's chest and abdomen for further assessment regarding potential sources of  patient's meeting of SIRS criteria. [CR]  1213 Patient's family at bedside.  States that patient is at baseline mentation and behaving as normal.  Unaware of any acute change since seeing the patient last.  Unaware of fever or possible infectious etiology recently based on no change in patient's symptoms. [CR]  1233 Lactic Acid, Venous(!!): 4.1 Sepsis fluid reassessment made. Lactic acid resulted 4.1. Will proceed with entirety of 30cc/kg bolus  [CR]  1241 Patient did not sit still during CT scan of head follow-up with the techs.  Will administer Haldol  to attempt getting better CT scan of chest abdomen and pelvis. [CR]  1448 Consulted hospitalist Dr. Mason Sole regarding the patient who agreed with admission and assume further treatment/care. [CR]    Clinical Course User Index [CR] [place] Butter, PA                                 Medical Decision Making Amount and/or Complexity of Data Reviewed Labs: ordered. Decision-making details documented in ED Course. Radiology: ordered.  Risk OTC drugs. Prescription drug management. Decision regarding hospitalization.   This patient presents to the ED for concern of fall, this involves an extensive number of treatment options, and is a complaint that carries with it a high risk of complications and morbidity.  The differential diagnosis includes CVA, fracture, strain/sprain, dislocation,, substance injury, neurovascular, mass, hemothorax, pneumothorax, solid organ damage, sepsis, UTI, pneumonia, intra-abdominal infection, cellulitis, wound, other other   Co morbidities that complicate the patient evaluation  See HPI   Additional history obtained:  Additional history obtained from EMR External records from outside source obtained and reviewed including hospital records   Lab Tests:  I Ordered, and personally interpreted labs.  The pertinent results include: Leukocytosis of 14.4 with left shift.  Anemia with hemoglobin 9.5 which is near  patient's baseline.  Cultures pending.  Patient with worsening renal function creatinine 1.94 with labs drawn yesterday showed creatinine 1.7.  No transaminitis.  Hyponatremia, decrease in bicarb 134 and 20 respectively.  UA with rare bacteria otherwise with negative leukocyte, nitrite, WBC, RBC.  Viral testing negative.  Lipase negative.  Initial lactic acid of 4.1 with repeat**  Imaging Studies ordered:  I ordered imaging studies including CT head/cervical spine, chest x-ray, pelvis  x-ray I independently visualized and interpreted imaging which showed  CT head/cervical spine: No acute intracranial abnormality.  No acute fracture or traumatic subluxation of cervical spine.  Multilevel degenerative changes in cervical spine. Chest x-ray: No acute cardiopulmonary abnormality Pelvis x-ray: No abnormality CT chest abdomen pelvis: No acute abnormality.  Cholelithiasis without evidence of cholecystitis.  Diverticulosis.  Aortic atherosclerosis. I agree with the radiologist interpretation   Cardiac Monitoring: / EKG:  The patient was maintained on a cardiac monitor.  I personally viewed and interpreted the cardiac monitored which showed an underlying rhythm of: Sinus tachycardia.  Ventricular bigeminy present.   Consultations Obtained:  I requested consultation with attending physician Dr. Lewis Red who was in agreement treatment plan going forward.  See ED course for others.   Problem List / ED Course / Critical interventions / Medication management  Fall, SIRS I ordered medication including lactated Ringer 's, Tylenol , rocephin  Reevaluation of the patient after these medicines showed that the patient improved I have reviewed the patients home medicines and have made adjustments as needed   Social Determinants of Health:  Denies tobacco, illicit drug use.   Test / Admission - Considered:  Fall, SIRS Vitals signs significant for tachycardia, fever of which improved.  Patient  subsequently developing softer blood pressures 102/61. Otherwise within normal range and stable throughout visit. Laboratory/imaging studies significant for: See above 89 year old male presents emergency department from [facility] assisted living facility after a fall.  Patient went to breakfast this morning with the other residents.  After breakfast, patient wandered into another resident's room.  Was found on the ground by the resident before assistance was called.  Nursing staff thinks the patient was only on the ground for about 10 minutes or so before he was found.  Does report trauma to head but no loss of consciousness.  Per the nursing staff, was not complaining of any pain at time of fall but EMS was called given fall in the setting of head injury.  Nursing staff does states that patient has had gradual decline over the past several weeks.  Reports more restricted food/liquid intake.  He denies any acute change over the past few days/week.  He denies any reported fever, vomiting, urinary symptoms, change in bowel habits, cough.  Difficult to obtain history from patient as he will only shake his head yes or no to certain questions but would not respond to every question will respond in detail.  Per [facility] staff, this is his baseline.  Patient does shake his head no when asked if he is having any pain anywhere. From a trauma perspective on exam, no obvious evidence clinically of traumatic mechanism without obvious reproducible tenderness but difficult to ascertain given lack of patient compliance.  Lungs relatively clear to auscultation bilaterally.  On initial presentation, patient with fever as well as tachycardia, sepsis protocol was performed.  From a traumatic perspective, imaging studies obtained without evidence of acute traumatic injury.  Patient's labs concerning for leukocytosis of 14.4 with left shift, lactic acidosis as well as worsening AKI as above.  No obvious infectious etiology found on  clinical exam or on workup.  Imaging studies without pneumonia, intra-abdominal pathology, intracranial pathology.  UA without obvious infection.  Viral testing negative.  Patient treated with sepsis protocol with 30 cc kilogram fluid bolus given lactic acidosis above 4 with empiric antibiotics in the form of Rocephin.  Blood cultures are pending.  Given that patient is meeting SIRS criteria with significant lactic acidosis, admission to the  hospital deemed most appropriate.  Patient is still full code per brothers who are medical powers of attorney.  Treatment plan discussed with patient/family and family acknowledge understanding were agreeable.  Patient stable upon admission.         Final Clinical Impression(s) / ED Diagnoses Final diagnoses:  None    Rx / DC Orders ED Discharge Orders     None         Sebring Butter, [place] 11/30/23 1452    Deatra Face, MD 12/02/23 [phone]

## 2023-11-30 NOTE — ED Notes (Signed)
 Pt has been changed and placed in a gown and brief. Family at bedside.

## 2023-11-30 NOTE — Plan of Care (Signed)

## 2023-11-30 NOTE — H&P (Signed)
 History and Physical    William Fisher EAV:[id number] DOB: [birth year] DOA: 11/30/2023  PCP: Barnetta Liberty, MD   Patient coming from: [facility]  Chief Complaint: Fall  HPI: William Fisher is a 89 y.o. male with medical history significant for dementia, type 2 diabetes, isolated paroxysmal atrial fibrillation, and CAD who presented to the ED from [facility] upon being found on the ground by a resident.  He was supposedly on the ground for about 10 minutes or so before he was found and there was no loss of consciousness or reported head trauma.  Nursing staff states that he has had gradual decline over the past several weeks with more restricted food and liquid intake.  No reports of fever, vomiting, diarrhea, urinary symptoms, or cough noted.  Patient at baseline only shakes his head yes or no to certain questions and is otherwise not very responsive.  It is therefore difficult to obtain any further relevant history.   ED Course: Vital signs with temperature 100.5 F and pulse of 110 with soft blood pressure readings.  Lactic acid 4.1 noted and leukocytosis of 14,400 with hemoglobin 9.5 and creatinine 1.94.  Sodium is also 134.  CT studies are unremarkable and urinalysis is also unremarkable.  He has received fluid bolus and has been started on Rocephin empirically.  Blood cultures have been obtained.  Flu and COVID studies are negative.  Review of Systems: Reviewed as noted above, otherwise negative.  Past Medical History:  Diagnosis Date   Coronary artery disease    Dementia (HCC)    Dementia (HCC) 11/22/2020   Diabetes mellitus (HCC)    Diverticulosis    Herpes encephalitis    HOH (hard of hearing)    hearing aides   Hyperlipidemia    Hyponatremia    Lung nodule    Chronic   Memory change 07/13/2014   Orthostatic hypotension    Palpitations    Paroxysmal atrial fibrillation (HCC)    Rhabdomyolysis    Mild   SOB (shortness of breath)     Past Surgical History:  Procedure  Laterality Date   HERNIA REPAIR     TONSILLECTOMY       reports that he has never smoked. He has never used smokeless tobacco. He reports that he does not drink alcohol and does not use drugs.  Allergies  Allergen Reactions   Codeine    Guaifenesin    Zinc Sulfate [Zinc]     Family History  Problem Relation Age of Onset   Stroke Father    Heart disease Brother    Stroke Brother     Prior to Admission medications   Medication Sig Start Date End Date Taking? Authorizing Provider  MACROBID 100 MG capsule Take 100 mg by mouth 2 (two) times daily. 11/15/23   [provider]  acetaminophen  (TYLENOL ) 500 MG tablet Take 500-1,000 mg by mouth every 6 (six) hours as needed.    [provider]  aspirin EC 81 MG tablet Take 81 mg by mouth daily. Swallow whole.    [provider]  Cholecalciferol (VITAMIN D3) 50 MCG (2000 UT) TABS Take by mouth.    [provider]  Cyanocobalamin  (B-12) 2500 MCG TABS Take 1 tablet by mouth daily.    [provider]  donepezil  (ARICEPT ) 5 MG tablet Take 5 mg by mouth in the morning.    [provider]  JANUMET 50-1000 MG per tablet Take 1 tablet by mouth 2 (two) times daily  with a meal.  06/28/14   [provider]  levothyroxine (SYNTHROID) 50 MCG tablet Take 25 mcg by mouth daily before breakfast.    [provider]  melatonin 5 MG TABS Take 5 mg by mouth.    [provider]  memantine  (NAMENDA ) 10 MG tablet Take 1 tablet (10 mg total) by mouth 2 (two) times daily. 11/22/20   Brian Campanile, MD  Multiple Vitamin (MULTIVITAMIN) capsule Take 1 capsule by mouth daily.    [provider]  Multiple Vitamins-Minerals (CERTAVITE SENIOR PO) Take 1 tablet by mouth daily.    [provider]  Polyethyl Glycol-Propyl Glycol (SYSTANE OP) Apply 1 drop to eye.    [provider]  RA MINERAL OIL liquid Take 15 mLs by mouth daily as needed for mild constipation. 08/04/23    [provider]  sertraline (ZOLOFT) 50 MG tablet Take 50 mg by mouth daily.    [provider]    Physical Exam: Vitals:   11/30/23 1058 11/30/23 1100 11/30/23 1200 11/30/23 1430  BP:  107/66 (!) 110/55 102/61  Pulse:  (!) 105 (!) 110 (!) 106  Resp:  20    Temp: (!) 100.5 F (38.1 C)     TempSrc: Rectal     SpO2:  93% 95% 92%  Weight:      Height:        Constitutional: NAD, calm, comfortable Vitals:   11/30/23 1058 11/30/23 1100 11/30/23 1200 11/30/23 1430  BP:  107/66 (!) 110/55 102/61  Pulse:  (!) 105 (!) 110 (!) 106  Resp:  20    Temp: (!) 100.5 F (38.1 C)     TempSrc: Rectal     SpO2:  93% 95% 92%  Weight:      Height:       Eyes: lids and conjunctivae normal Neck: normal, supple Respiratory: clear to auscultation bilaterally. Normal respiratory effort. No accessory muscle use.  Cardiovascular: Regular rate and rhythm, no murmurs. Abdomen: no tenderness, no distention. Bowel sounds positive.  Musculoskeletal:  No edema. Skin: no rashes, lesions, ulcers.  Psychiatric: Flat affect  Labs on Admission: I have personally reviewed following labs and imaging studies  CBC: Recent Labs  Lab 11/30/23 1153  WBC 14.4*  NEUTROABS 12.1*  HGB 9.5*  HCT 29.1*  MCV 89.8  PLT 228   Basic Metabolic Panel: Recent Labs  Lab 11/30/23 1153  NA 134*  K 4.4  CL 100  CO2 20*  GLUCOSE 179*  BUN 36*  CREATININE 1.94*  CALCIUM 9.3   GFR: Estimated Creatinine Clearance: 24.3 mL/min (A) (by C-G formula based on SCr of 1.94 mg/dL (H)). Liver Function Tests: Recent Labs  Lab 11/30/23 1153  AST 38  ALT 27  ALKPHOS 71  BILITOT 0.4  PROT 7.0  ALBUMIN 2.6*   Recent Labs  Lab 11/30/23 1153  LIPASE 37   No results for input(s): "AMMONIA" in the last 168 hours. Coagulation Profile: Recent Labs  Lab 11/30/23 1153  INR 1.3*   Cardiac Enzymes: No results for input(s): "CKTOTAL", "CKMB", "CKMBINDEX", "TROPONINI" in the last 168 hours. BNP  (last 3 results) No results for input(s): "PROBNP" in the last 8760 hours. HbA1C: No results for input(s): "HGBA1C" in the last 72 hours. CBG: No results for input(s): "GLUCAP" in the last 168 hours. Lipid Profile: No results for input(s): "CHOL", "HDL", "LDLCALC", "TRIG", "CHOLHDL", "LDLDIRECT" in the last 72 hours. Thyroid  Function Tests: No results for input(s): "TSH", "T4TOTAL", "FREET4", "T3FREE", "THYROIDAB" in  the last 72 hours. Anemia Panel: No results for input(s): "VITAMINB12", "FOLATE", "FERRITIN", "TIBC", "IRON", "RETICCTPCT" in the last 72 hours. Urine analysis:    Component Value Date/Time   COLORURINE YELLOW 11/30/2023 1048   APPEARANCEUR HAZY (A) 11/30/2023 1048   LABSPEC 1.019 11/30/2023 1048   PHURINE 5.0 11/30/2023 1048   GLUCOSEU NEGATIVE 11/30/2023 1048   HGBUR NEGATIVE 11/30/2023 1048   BILIRUBINUR NEGATIVE 11/30/2023 1048   KETONESUR NEGATIVE 11/30/2023 1048   PROTEINUR 30 (A) 11/30/2023 1048   UROBILINOGEN 0.2 02/27/2010 1354   NITRITE NEGATIVE 11/30/2023 1048   LEUKOCYTESUR NEGATIVE 11/30/2023 1048    Radiological Exams on Admission: CT CHEST ABDOMEN PELVIS W CONTRAST Result Date: 11/30/2023 CLINICAL DATA:  Sepsis EXAM: CT CHEST, ABDOMEN, AND PELVIS WITH CONTRAST TECHNIQUE: Multidetector CT imaging of the chest, abdomen and pelvis was performed following the standard protocol during bolus administration of intravenous contrast. RADIATION DOSE REDUCTION: This exam was performed according to the departmental dose-optimization program which includes automated exposure control, adjustment of the mA and/or kV according to patient size and/or use of iterative reconstruction technique. CONTRAST:  80mL OMNIPAQUE IOHEXOL 300 MG/ML  SOLN COMPARISON:  CT abdomen pelvis 12/14/2008 FINDINGS: CT CHEST FINDINGS Cardiovascular: Normal heart size. No significant pericardial effusion. The thoracic aorta is normal in caliber. At least moderate calcified and noncalcified  atherosclerotic plaque of the thoracic aorta. Four-vessel coronary artery calcifications. Aortic valve leaflet calcification. Mediastinum/Nodes: No enlarged mediastinal, hilar, or axillary lymph nodes. Thyroid  gland, trachea, and esophagus demonstrate no significant findings. Lungs/Pleura: Slightly limited evaluation due to motion artifact. Bibasilar atelectasis. No focal consolidation. No pulmonary nodule. No pulmonary mass. No pleural effusion. No pneumothorax. Musculoskeletal: No chest wall abnormality. No suspicious lytic or blastic osseous lesions. No acute displaced fracture. CT ABDOMEN PELVIS FINDINGS Hepatobiliary: No focal liver abnormality. Calcified gallstone noted within the gallbladder lumen. No gallbladder wall thickening or pericholecystic fluid. No biliary dilatation. Pancreas: No focal lesion. Normal pancreatic contour. No surrounding inflammatory changes. No main pancreatic ductal dilatation. Spleen: Normal in size without focal abnormality. Adrenals/Urinary Tract: No adrenal nodule bilaterally. Bilateral kidneys enhance symmetrically. No hydronephrosis. No hydroureter. The urinary bladder is unremarkable. Stomach/Bowel: Stomach is within normal limits. No evidence of bowel wall thickening or dilatation. Colonic diverticulosis. Appendix appears normal. Vascular/Lymphatic: No abdominal aorta or iliac aneurysm. Severe atherosclerotic plaque of the aorta and its branches. No abdominal, pelvic, or inguinal lymphadenopathy. Reproductive: Prostate is unremarkable. Other: No intraperitoneal free fluid. No intraperitoneal free gas. No organized fluid collection. Musculoskeletal: No abdominal wall hernia or abnormality. No suspicious lytic or blastic osseous lesions. No acute displaced fracture. Grade 1 anterolisthesis of L5 on S1 with associated bilateral L5 pars interarticularis defects. IMPRESSION: 1. No acute intrathoracic abnormality. 2. Cholelithiasis with no CT evidence of acute cholecystitis. 3.  Colonic diverticulosis with no acute diverticulitis. 4. Aortic Atherosclerosis (ICD10-I70.0) including four-vessel coronary calcification and aortic valve leaflet calcifications-correlate for aortic stenosis. Electronically Signed   By: Morgane  Naveau M.D.   On: 11/30/2023 13:46   DG Chest Port 1 View Result Date: 11/30/2023 CLINICAL DATA:  Fall. EXAM: PORTABLE CHEST 1 VIEW COMPARISON:  02/26/2020 FINDINGS: The lungs are clear without focal pneumonia, edema, pneumothorax or pleural effusion. Calcified granuloma noted left lower lobe is seen on CT 07/06/2020. Cardiopericardial silhouette is at upper limits of normal for size. No acute bony abnormality. Telemetry leads overlie the chest. IMPRESSION: No active disease. Electronically Signed   By: Donnal Fusi M.D.   On: 11/30/2023 11:51   DG Pelvis Portable Result  Date: 11/30/2023 CLINICAL DATA:  Unwitnessed fall. EXAM: PORTABLE PELVIS 1-2 VIEWS COMPARISON:  None Available. FINDINGS: Bones are diffusely demineralized. SI joints and symphysis pubis unremarkable. No evidence for an acute fracture. No evidence for hip dislocation. IMPRESSION: Negative. Electronically Signed   By: Donnal Fusi M.D.   On: 11/30/2023 11:50   CT Head Wo Contrast Result Date: 11/30/2023 EXAM: CT HEAD AND CERVICAL SPINE 11/30/2023 11:32:55 AM TECHNIQUE: CT of the head and cervical spine was performed without the administration of intravenous contrast. Multiplanar reformatted images are provided for review. Automated exposure control, iterative reconstruction, and/or weight based adjustment of the mA/kV was utilized to reduce the radiation dose to as low as reasonably achievable. COMPARISON: Head CT dated 02/26/2020 and cervical spine MRI dated 07/01/2011. CLINICAL HISTORY: Head trauma, minor (Age >= 65y). Trauma; Best possible images; Dementia; refused to lay on back or to keep arms down; Could not follow instructions; BIB [facility] for unwitnessed fall. States pt was found in the  corner of anothers' room on the floor. No visible abrasions. Hx of dementia, diabetes (CBG 243 per EMS). Takes ASA daily. States pt was last seen at [facility] @ 0945. FINDINGS: CT HEAD BRAIN AND VENTRICLES: No acute intracranial hemorrhage. No mass effect or midline shift. No abnormal extra-axial fluid collection. No hydrocephalus. Unchanged chronic encephalomalacia of the anterior right temporal lobe and right insula. Gray-white differentiation is otherwise maintained. Stable background of moderate-to-severe chronic small vessel disease. ORBITS: No acute abnormality. SINUSES AND MASTOIDS: No acute abnormality. SOFT TISSUES AND SKULL: No acute skull fracture. No acute soft tissue abnormality. CT CERVICAL SPINE BONES AND ALIGNMENT: No acute fracture or traumatic malalignment. DEGENERATIVE CHANGES: Interval progression of multilevel disc height loss and degenerative endplate changes with mild spinal canal stenosis at C5-6 and C6-7. SOFT TISSUES: No prevertebral soft tissue swelling. VASCULATURE: Atherosclerotic calcifications of the carotid bulbs. IMPRESSION: 1. No acute intracranial abnormality. 2. No acute fracture or traumatic malalignment of the cervical spine. 3. Interval progression of multilevel disc height loss and degenerative endplate changes with mild spinal canal stenosis at C5-6 and C6-7. Electronically signed by: Audra Blend MD 11/30/2023 11:42 AM EDT RP Workstation: [id number]   CT Cervical Spine Wo Contrast Result Date: 11/30/2023 EXAM: CT HEAD AND CERVICAL SPINE 11/30/2023 11:32:55 AM TECHNIQUE: CT of the head and cervical spine was performed without the administration of intravenous contrast. Multiplanar reformatted images are provided for review. Automated exposure control, iterative reconstruction, and/or weight based adjustment of the mA/kV was utilized to reduce the radiation dose to as low as reasonably achievable. COMPARISON: Head CT dated 02/26/2020 and cervical spine MRI dated  07/01/2011. CLINICAL HISTORY: Head trauma, minor (Age >= 65y). Trauma; Best possible images; Dementia; refused to lay on back or to keep arms down; Could not follow instructions; BIB [facility] for unwitnessed fall. States pt was found in the corner of anothers' room on the floor. No visible abrasions. Hx of dementia, diabetes (CBG 243 per EMS). Takes ASA daily. States pt was last seen at [facility] @ 0945. FINDINGS: CT HEAD BRAIN AND VENTRICLES: No acute intracranial hemorrhage. No mass effect or midline shift. No abnormal extra-axial fluid collection. No hydrocephalus. Unchanged chronic encephalomalacia of the anterior right temporal lobe and right insula. Gray-white differentiation is otherwise maintained. Stable background of moderate-to-severe chronic small vessel disease. ORBITS: No acute abnormality. SINUSES AND MASTOIDS: No acute abnormality. SOFT TISSUES AND SKULL: No acute skull fracture. No acute soft tissue abnormality. CT CERVICAL SPINE BONES AND ALIGNMENT: No acute fracture or  traumatic malalignment. DEGENERATIVE CHANGES: Interval progression of multilevel disc height loss and degenerative endplate changes with mild spinal canal stenosis at C5-6 and C6-7. SOFT TISSUES: No prevertebral soft tissue swelling. VASCULATURE: Atherosclerotic calcifications of the carotid bulbs. IMPRESSION: 1. No acute intracranial abnormality. 2. No acute fracture or traumatic malalignment of the cervical spine. 3. Interval progression of multilevel disc height loss and degenerative endplate changes with mild spinal canal stenosis at C5-6 and C6-7. Electronically signed by: Audra Blend MD 11/30/2023 11:42 AM EDT RP Workstation: [id number]    EKG: Independently reviewed.  Sinus tachycardia 135 bpm.  Assessment/Plan Principal Problem:   SIRS (systemic inflammatory response syndrome) (HCC) Active Problems:   PAROXYSMAL ATRIAL FIBRILLATION   Essential hypertension   Dementia (HCC)    SIRS with lactic acidosis - No  source of infection currently identified and blood cultures pending -Noted to have recent UTI and was placed on Macrobid which he was taking - Continue on Rocephin empirically - Continue IV fluid - Monitor CBC and lactic acid trend - Family members okay with pressors as needed  Mild AKI with mild hyponatremia - Likely due to dehydration, continue IV fluid - Hold nephrotoxic agents - Check CK  Fall - Fall precautions - PT/OT  Dementia - Currently on Namenda  and Aricept   Type 2 diabetes with hyperglycemia - SSI - Hold home oral agents  Hypertension - Hold home antihypertensives with soft blood pressure readings  DVT prophylaxis: Heparin Code Status: DNR Family Communication: 2 sons at bedside 6/1 Disposition Plan:Admit for evaluation of SIRS/Sepsis Consults called: Palliative care Admission status: Inpatient, SDU  Severity of Illness: The appropriate patient status for this patient is INPATIENT. Inpatient status is judged to be reasonable and necessary in order to provide the required intensity of service to ensure the patient's safety. The patient's presenting symptoms, physical exam findings, and initial radiographic and laboratory data in the context of their chronic comorbidities is felt to place them at high risk for further clinical deterioration. Furthermore, it is not anticipated that the patient will be medically stable for discharge from the hospital within 2 midnights of admission.   * I certify that at the point of admission it is my clinical judgment that the patient will require inpatient hospital care spanning beyond 2 midnights from the point of admission due to high intensity of service, high risk for further deterioration and high frequency of surveillance required.*   Neta Upadhyay D Millie Forde DO [facility]  If 7PM-7AM, please contact night-coverage [email or web address]  11/30/2023, 3:00 PM

## 2023-11-30 NOTE — ED Triage Notes (Signed)
 BIB [facility] for unwitnessed fall. States pt was found in the corner of anothers' room on the floor . No visible abrasions. Hx of dementia, diabetes (CBG 243 per EMS). Takes ASA daily. States pt was last seen at [facility] @ [phone]

## 2023-11-30 NOTE — ED Notes (Signed)
 Pt not in room at this time. When he returns, the IV and fluids and meds will begin.

## 2023-11-30 NOTE — Sepsis Progress Note (Signed)
 Sepsis protocol is being followed by [facility].

## 2023-12-01 DIAGNOSIS — Z515 Encounter for palliative care: Secondary | ICD-10-CM

## 2023-12-01 DIAGNOSIS — N179 Acute kidney failure, unspecified: Secondary | ICD-10-CM | POA: Diagnosis not present

## 2023-12-01 DIAGNOSIS — Z7189 Other specified counseling: Secondary | ICD-10-CM | POA: Diagnosis not present

## 2023-12-01 DIAGNOSIS — E871 Hypo-osmolality and hyponatremia: Secondary | ICD-10-CM | POA: Diagnosis not present

## 2023-12-01 DIAGNOSIS — R627 Adult failure to thrive: Secondary | ICD-10-CM | POA: Diagnosis not present

## 2023-12-01 DIAGNOSIS — E8721 Acute metabolic acidosis: Secondary | ICD-10-CM | POA: Diagnosis not present

## 2023-12-01 DIAGNOSIS — R6510 Systemic inflammatory response syndrome (SIRS) of non-infectious origin without acute organ dysfunction: Secondary | ICD-10-CM | POA: Diagnosis not present

## 2023-12-01 LAB — BLOOD CULTURE ID PANEL (REFLEXED) - BCID2

## 2023-12-01 LAB — GLUCOSE, CAPILLARY
Glucose-Capillary: 110 mg/dL — ABNORMAL HIGH (ref 70–99)
Glucose-Capillary: 128 mg/dL — ABNORMAL HIGH (ref 70–99)
Glucose-Capillary: 122 mg/dL — ABNORMAL HIGH (ref 70–99)
Glucose-Capillary: 103 mg/dL — ABNORMAL HIGH (ref 70–99)

## 2023-12-01 LAB — BASIC METABOLIC PANEL WITH GFR
Anion gap: 7 (ref 5–15)
BUN: 28 mg/dL — ABNORMAL HIGH (ref 8–23)
CO2: 23 mmol/L (ref 22–32)
Calcium: 8.5 mg/dL — ABNORMAL LOW (ref 8.9–10.3)
Chloride: 107 mmol/L (ref 98–111)
Creatinine, Ser: 1.51 mg/dL — ABNORMAL HIGH (ref 0.61–1.24)
GFR, Estimated: 44 mL/min — ABNORMAL LOW (ref >60)
Glucose, Bld: 119 mg/dL — ABNORMAL HIGH (ref 70–99)
Potassium: 4.1 mmol/L (ref 3.5–5.1)
Sodium: 137 mmol/L (ref 135–145)

## 2023-12-01 LAB — CBC
HCT: 26.5 % — ABNORMAL LOW (ref 39.0–52.0)
Hemoglobin: 8.5 g/dL — ABNORMAL LOW (ref 13.0–17.0)
MCH: 29 pg (ref 26.0–34.0)
MCHC: 32.1 g/dL (ref 30.0–36.0)
MCV: 90.4 fL (ref 80.0–100.0)
Platelets: 160 10*3/uL (ref 150–400)
RBC: 2.93 MIL/uL — ABNORMAL LOW (ref 4.22–5.81)
RDW: 14 % (ref 11.5–15.5)
WBC: 9.1 10*3/uL (ref 4.0–10.5)
nRBC: 0 % (ref 0.0–0.2)

## 2023-12-01 LAB — MAGNESIUM: Magnesium: 1.6 mg/dL — ABNORMAL LOW (ref 1.7–2.4)

## 2023-12-01 LAB — MRSA NEXT GEN BY PCR, NASAL: MRSA by PCR Next Gen: NOT DETECTED

## 2023-12-01 MED ORDER — LEVOTHYROXINE SODIUM 50 MCG PO TABS
50.0000 ug | ORAL_TABLET | Freq: Every day | ORAL | Status: DC
  Administered 2023-12-02: 50 ug via ORAL
  Filled 2023-12-01 (×2): qty 1

## 2023-12-01 MED ORDER — SODIUM CHLORIDE 0.9 % IV SOLN: INTRAVENOUS | Status: AC

## 2023-12-01 MED ORDER — MAGNESIUM SULFATE 2 GM/50ML IV SOLN
2.0000 g | Freq: Once | INTRAVENOUS | Status: AC
  Administered 2023-12-01: 2 g via INTRAVENOUS
  Filled 2023-12-01: qty 50

## 2023-12-01 NOTE — Consult Note (Signed)
 Consultation Note Date: 12/01/2023   Patient Name: William Fisher  DOB: 29-Jul-1931  MRN: [id number]  Age / Sex: 89 y.o., male  PCP: Barnetta Liberty, MD Referring Physician: Cornelius Dill, DO  Reason for Consultation: Establishing goals of care  HPI/Patient Profile: 89 y.o. male  with past medical history of dementia, type 2 diabetes, paroxysmal atrial fibrillation, hyponatremia, CAD, HLD, orthostatic hypotension, diverticulosis admitted on 11/30/2023 from [facility] with fall found to have SIRS likely due to UTI.   Clinical Assessment and Goals of Care: Consult received and chart review completed. I met today at William Fisher bedside along with son Debria Fang. William Fisher is awake and interactive. He is hard of hearing. Unable to participate fully in conversation due to dementia.   I discussed privately with Debria Fang. We discussed his father's significant decline over the past few weeks. We reviewed acute illness such as UTI in the setting of dementia. We discussed failure to thrive and poor intake. We discussed how the brain controls all we do and sometimes slows us  down to compensate for declining health towards end of life. Debria Fang has seen this progression and agrees that he sees signs that his father is coming towards end of life. We discussed goals of care and focus on quality vs quantity of life. We discussed the importance of what his father would want if he were aware of his situation. Debria Fang confirms that he does not feel his father would want to be prolonged in his current state of health. He is not really eating or drinking much the past couple weeks.   We further discussed plans moving forward and the options of support from palliative care vs hospice. I explained that I recommend consideration of hospice support. William Fisher has been living at [facility] for the past ~3 years. Debria Fang shares that he and his brothers are  meeting this afternoon with provider at [facility] and will discuss with them as well.   I provided Debria Fang with my contact information. I explained that I will be happy to meet with his family together or answer any questions for them the best I am able. Debria Fang expressed appreciation of the conversation and information.   All questions/concerns addressed. Emotional support provided.   Update: I received message and called Debria Fang. Debria Fang shares that he and his brothers would like to meet with myself and Dr. Mason Sole. I discussed with Dr. Mason Sole. We agreed to meet together tomorrow 6/3 1000 am.    Primary Decision Maker HCPOA Arville Bis, Lacey Pian    SUMMARY OF RECOMMENDATIONS   - DNR - Continue conservative measures - Avoid invasive/aggressive measures if decline - Family considering hospice  Code Status/Advance Care Planning: DNR   Symptom Management:  Per attending  Prognosis:  Likely weeks.   Discharge Planning: To Be Determined      Primary Diagnoses: Present on Admission:  SIRS (systemic inflammatory response syndrome) (HCC)  Dementia (HCC)  Essential hypertension  PAROXYSMAL ATRIAL FIBRILLATION   I have reviewed the medical record, interviewed  the patient and family, and examined the patient. The following aspects are pertinent.  Past Medical History:  Diagnosis Date   Coronary artery disease    Dementia (HCC)    Dementia (HCC) 11/22/2020   Diabetes mellitus (HCC)    Diverticulosis    Herpes encephalitis    HOH (hard of hearing)    hearing aides   Hyperlipidemia    Hyponatremia    Lung nodule    Chronic   Memory change 07/13/2014   Orthostatic hypotension    Palpitations    Paroxysmal atrial fibrillation (HCC)    Rhabdomyolysis    Mild   SOB (shortness of breath)    Social History   Socioeconomic History   Marital status: Widowed    Spouse name: Not on file   Number of children: 4   Years of education: HS   Highest education level: Not on file   Occupational History   Occupation: Retired  Tobacco Use   Smoking status: Never   Smokeless tobacco: Never  Substance and Sexual Activity   Alcohol use: No   Drug use: No   Sexual activity: Not Currently  Other Topics Concern   Not on file  Social History Narrative   Patient is right handed.   Patient drinks some caffeine daily.   Patient lives at [facility] [place]   Social Drivers of Health   Financial Resource Strain: Not on file  Food Insecurity: No Food Insecurity (11/30/2023)   Hunger Vital Sign    Worried About Running Out of Food in the Last Year: Never true    Ran Out of Food in the Last Year: Never true  Transportation Needs: No Transportation Needs (11/30/2023)   PRAPARE - Administrator, Civil Service (Medical): No    Lack of Transportation (Non-Medical): No  Physical Activity: Not on file  Stress: Not on file  Social Connections: Unknown (11/30/2023)   Social Connection and Isolation Panel [NHANES]    Frequency of Communication with Friends and Family: More than three times a week    Frequency of Social Gatherings with Friends and Family: More than three times a week    Attends Religious Services: Patient declined    Database administrator or Organizations: No    Attends Engineer, structural: Never    Marital Status: Patient unable to answer   Family History  Problem Relation Age of Onset   Stroke Father    Heart disease Brother    Stroke Brother    Scheduled Meds:  aspirin EC  81 mg Oral Daily   Chlorhexidine Gluconate Cloth  6 each Topical Q0600   donepezil   5 mg Oral q AM   heparin  5,000 Units Subcutaneous Q8H   insulin  aspart  0-5 Units Subcutaneous QHS   insulin  aspart  0-9 Units Subcutaneous TID WC   levothyroxine  25 mcg Oral QAC breakfast   memantine   10 mg Oral BID   sertraline  50 mg Oral Daily   Continuous Infusions:  sodium chloride  100 mL/hr at 12/01/23 0518   cefTRIAXone (ROCEPHIN)  IV     magnesium  sulfate bolus IVPB     PRN Meds:.acetaminophen  **OR** acetaminophen , ondansetron  **OR** ondansetron  (ZOFRAN ) IV Allergies  Allergen Reactions   Codeine    Guaifenesin    Zinc Sulfate [Zinc]    Review of Systems  Unable to perform ROS: Acuity of condition    Physical Exam Vitals and nursing note reviewed.  Cardiovascular:  Rate and Rhythm: Normal rate.  Pulmonary:     Effort: No tachypnea, accessory muscle usage or respiratory distress.  Abdominal:     Palpations: Abdomen is soft.  Neurological:     Mental Status: He is alert. He is confused.     Vital Signs: BP (!) 117/44   Pulse 98   Temp 97.6 F (36.4 C) (Oral)   Resp 19   Ht 5\' 10"  (1.778 m)   Wt 70.2 kg   SpO2 94%   BMI 22.21 kg/m  Pain Scale: PAINAD POSS *See Group Information*: 1-Acceptable,Awake and alert Pain Score: 0-No pain   SpO2: SpO2: 94 % O2 Device:SpO2: 94 % O2 Flow Rate: .   IO: Intake/output summary:  Intake/Output Summary (Last 24 hours) at 12/01/2023 0831 Last data filed at 12/01/2023 0518 Gross per 24 hour  Intake 3659.27 ml  Output --  Net 3659.27 ml    LBM:   Baseline Weight: Weight: 68 kg Most recent weight: Weight: 70.2 kg     Palliative Assessment/Data:    Time Total: 75 min  Greater than 50%  of this time was spent counseling and coordinating care related to the above assessment and plan.  Signed by: Vila Grayer, NP Palliative Medicine Team Pager # [phone] (M-F 8a-5p) Team Phone # [phone] (Nights/Weekends)

## 2023-12-01 NOTE — Progress Notes (Signed)
 Call received from 300 RN, [place] stating pts linens were soaked at time of assessment, pt remained continent during this shift prior to transport but would intermittently get up oob impulsively w/o hitting call bell. Bed alarm placed on upon transfer. Pt had X2 urine occurrence today/ and x1 stool. Relayed this information to RN and offered to come help change sheets, denied at this time.

## 2023-12-01 NOTE — Progress Notes (Addendum)
 At 2153 hrs Tele tech Tammy calls me and notifies that Patient a 5 beat run of v-tach. Tammy was informed to call me back immediately if patient has another incident. Patient is asymptomatic in bed.  Mittens had to be placed on both hands due to patient pulling off tele leads, male wick, gown, tried to pull out IV. CC Latina, RN  was also notified.

## 2023-12-01 NOTE — Progress Notes (Signed)
 PROGRESS NOTE    William Fisher  RUE:[id number] DOB: 1931-11-29 DOA: 11/30/2023 PCP: Barnetta Liberty, MD   Brief Narrative:    William Fisher is a 89 y.o. male with medical history significant for dementia, type 2 diabetes, isolated paroxysmal atrial fibrillation, and CAD who presented to the ED from [facility] upon being found on the ground by a resident.  Patient was admitted with fall likely secondary to orthostasis with dehydration and mild AKI.  He was noted to meet SIRS criteria with lactic acidosis and has history of recent UTI and was started on Rocephin empirically as well as IV fluid.  Appears to be failing to thrive and family members contemplating hospice soon.  Palliative consulted.  Assessment & Plan:   Principal Problem:   SIRS (systemic inflammatory response syndrome) (HCC) Active Problems:   PAROXYSMAL ATRIAL FIBRILLATION   Essential hypertension   Dementia (HCC)  Assessment and Plan:   SIRS with lactic acidosis-improved - No source of infection currently identified and blood cultures pending -Noted to have recent UTI and was placed on Macrobid which he was taking - Continue on Rocephin empirically - Continue IV fluid - Monitor CBC and lactic acid trend - Family members okay with pressors as needed   Mild AKI with mild hyponatremia-improving - Likely due to dehydration, continue IV fluid - Hold nephrotoxic agents - CK unremarkable   Fall - Fall precautions - PT/OT   Dementia - Currently on Namenda  and Aricept  - TSH of 4  Hypomagnesemia -Replete and reevaluate in a.m.   Type 2 diabetes with hyperglycemia - SSI -A1c 7.7% - Hold home oral agents   Hypertension - Hold home antihypertensives with soft blood pressure readings -Okay to move to telemetry today with stable blood pressure readings overnight and improvement in hydration   DVT prophylaxis:Heparin Code Status: DNR Family Communication: Son at bedside 6/2 Disposition Plan:  Status is:  Inpatient Remains inpatient appropriate because: Continue IV fluid.   Consultants:  Palliative  Procedures:  None  Antimicrobials:  Anti-infectives (From admission, onward)    Start     Dose/Rate Route Frequency Ordered Stop   12/01/23 1000  cefTRIAXone (ROCEPHIN) 2 g in sodium chloride  0.9 % 100 mL IVPB        2 g 200 mL/hr over 30 Minutes Intravenous Every 24 hours 11/30/23 1519     11/30/23 1130  cefTRIAXone (ROCEPHIN) 2 g in sodium chloride  0.9 % 100 mL IVPB        2 g 200 mL/hr over 30 Minutes Intravenous  Once 11/30/23 1119 11/30/23 1236      Subjective: Patient seen and evaluated today and is currently sleep with no acute overnight events noted.  Son is at bedside.  Family planning to have hospice talks with provider at facility this afternoon.  Objective: Vitals:   12/01/23 0431 12/01/23 0626 12/01/23 0638 12/01/23 0749  BP:   (!) 117/44   Pulse:   98   Resp:   19   Temp: (!) 97 F (36.1 C)   97.6 F (36.4 C)  TempSrc: Axillary   Oral  SpO2:   94%   Weight:  70.2 kg    Height:        Intake/Output Summary (Last 24 hours) at 12/01/2023 1046 Last data filed at 12/01/2023 0518 Gross per 24 hour  Intake 3659.27 ml  Output --  Net 3659.27 ml   Filed Weights   11/30/23 1042 11/30/23 1546 12/01/23 0626  Weight: 68 kg 70.2  kg 70.2 kg    Examination:  General exam: Appears calm and comfortable, somnolent Respiratory system: Clear to auscultation. Respiratory effort normal. Cardiovascular system: S1 & S2 heard, RRR.  Gastrointestinal system: Abdomen is soft Central nervous system: Somnolent Extremities: No edema Skin: No significant lesions noted Psychiatry: Flat affect.    Data Reviewed: I have personally reviewed following labs and imaging studies  CBC: Recent Labs  Lab 11/30/23 1153 12/01/23 0340  WBC 14.4* 9.1  NEUTROABS 12.1*  --   HGB 9.5* 8.5*  HCT 29.1* 26.5*  MCV 89.8 90.4  PLT 228 160   Basic Metabolic Panel: Recent Labs  Lab  11/30/23 1153 12/01/23 0340  NA 134* 137  K 4.4 4.1  CL 100 107  CO2 20* 23  GLUCOSE 179* 119*  BUN 36* 28*  CREATININE 1.94* 1.51*  CALCIUM 9.3 8.5*  MG  --  1.6*   GFR: Estimated Creatinine Clearance: 32.3 mL/min (A) (by C-G formula based on SCr of 1.51 mg/dL (H)). Liver Function Tests: Recent Labs  Lab 11/30/23 1153  AST 38  ALT 27  ALKPHOS 71  BILITOT 0.4  PROT 7.0  ALBUMIN 2.6*   Recent Labs  Lab 11/30/23 1153  LIPASE 37   No results for input(s): "AMMONIA" in the last 168 hours. Coagulation Profile: Recent Labs  Lab 11/30/23 1153  INR 1.3*   Cardiac Enzymes: Recent Labs  Lab 11/30/23 1518  CKTOTAL 112   BNP (last 3 results) No results for input(s): "PROBNP" in the last 8760 hours. HbA1C: Recent Labs    11/30/23 1547  HGBA1C 7.7*   CBG: Recent Labs  Lab 11/30/23 1625 11/30/23 2144 12/01/23 0744  GLUCAP 139* 114* 110*   Lipid Profile: No results for input(s): "CHOL", "HDL", "LDLCALC", "TRIG", "CHOLHDL", "LDLDIRECT" in the last 72 hours. Thyroid  Function Tests: Recent Labs    11/30/23 1519  TSH 4.099   Anemia Panel: No results for input(s): "VITAMINB12", "FOLATE", "FERRITIN", "TIBC", "IRON", "RETICCTPCT" in the last 72 hours. Sepsis Labs: Recent Labs  Lab 11/30/23 1153 11/30/23 1425  LATICACIDVEN 4.1* 2.5*    Recent Results (from the past 240 hours)  Resp panel by RT-PCR (RSV, Flu A&B, Covid) Anterior Nasal Swab     Status: None   Collection Time: 11/30/23 10:48 AM   Specimen: Anterior Nasal Swab  Result Value Ref Range Status   SARS Coronavirus 2 by RT PCR NEGATIVE NEGATIVE Final    Comment: (NOTE) SARS-CoV-2 target nucleic acids are NOT DETECTED.  The SARS-CoV-2 RNA is generally detectable in upper respiratory specimens during the acute phase of infection. The lowest concentration of SARS-CoV-2 viral copies this assay can detect is 138 copies/mL. A negative result does not preclude SARS-Cov-2 infection and should not be  used as the sole basis for treatment or other patient management decisions. A negative result may occur with  improper specimen collection/handling, submission of specimen other than nasopharyngeal swab, presence of viral mutation(s) within the areas targeted by this assay, and inadequate number of viral copies(<138 copies/mL). A negative result must be combined with clinical observations, patient history, and epidemiological information. The expected result is Negative.  Fact Sheet for Patients:  BloggerCourse.com  Fact Sheet for Healthcare Providers:  SeriousBroker.it  This test is no t yet approved or cleared by the [place]  FDA and  has been authorized for detection and/or diagnosis of SARS-CoV-2 by FDA under an Emergency Use Authorization (EUA). This EUA will remain  in effect (meaning this test can be used) for the  duration of the COVID-19 declaration under Section 564(b)(1) of the Act, 21 U.S.C.section 360bbb-3(b)(1), unless the authorization is terminated  or revoked sooner.       Influenza A by PCR NEGATIVE NEGATIVE Final   Influenza B by PCR NEGATIVE NEGATIVE Final    Comment: (NOTE) The Xpert Xpress SARS-CoV-2/FLU/RSV plus assay is intended as an aid in the diagnosis of influenza from Nasopharyngeal swab specimens and should not be used as a sole basis for treatment. Nasal washings and aspirates are unacceptable for Xpert Xpress SARS-CoV-2/FLU/RSV testing.  Fact Sheet for Patients: BloggerCourse.com  Fact Sheet for Healthcare Providers: SeriousBroker.it  This test is not yet approved or cleared by the [place]  FDA and has been authorized for detection and/or diagnosis of SARS-CoV-2 by FDA under an Emergency Use Authorization (EUA). This EUA will remain in effect (meaning this test can be used) for the duration of the COVID-19 declaration under Section  564(b)(1) of the Act, 21 U.S.C. section 360bbb-3(b)(1), unless the authorization is terminated or revoked.     Resp Syncytial Virus by PCR NEGATIVE NEGATIVE Final    Comment: (NOTE) Fact Sheet for Patients: BloggerCourse.com  Fact Sheet for Healthcare Providers: SeriousBroker.it  This test is not yet approved or cleared by the [place]  FDA and has been authorized for detection and/or diagnosis of SARS-CoV-2 by FDA under an Emergency Use Authorization (EUA). This EUA will remain in effect (meaning this test can be used) for the duration of the COVID-19 declaration under Section 564(b)(1) of the Act, 21 U.S.C. section 360bbb-3(b)(1), unless the authorization is terminated or revoked.  Performed at [facility], [address], [place], [place] [address]   Blood Culture (routine x 2)     Status: None (Preliminary result)   Collection Time: 11/30/23 11:53 AM   Specimen: BLOOD  Result Value Ref Range Status   Specimen Description BLOOD BLOOD LEFT ARM  Final   Special Requests   Final    BOTTLES DRAWN AEROBIC AND ANAEROBIC Blood Culture adequate volume   Culture  Setup Time   Final    AEROBIC BOTTLE ONLY GRAM POSITIVE COCCI Gram Stain Report Called to,Read Back By and Verified With: A SHELTON AT 0951 12/01/23 BY A WILSON Performed at [facility], [address] [place], [place] [address]    Culture [facility] POSITIVE COCCI  Final   Report Status PENDING  Incomplete  Blood Culture (routine x 2)     Status: None (Preliminary result)   Collection Time: 11/30/23  2:25 PM   Specimen: BLOOD  Result Value Ref Range Status   Specimen Description BLOOD BLOOD RIGHT ARM  Final   Special Requests   Final    BOTTLES DRAWN AEROBIC AND ANAEROBIC Blood Culture adequate volume   Culture   Final    NO GROWTH < 24 HOURS Performed at [facility], [address] [place], [place] [address]    Report Status PENDING  Incomplete  MRSA Next Gen by  PCR, Nasal     Status: None   Collection Time: 11/30/23  3:45 PM   Specimen: Nasal Mucosa; Nasal Swab  Result Value Ref Range Status   MRSA by PCR Next Gen NOT DETECTED NOT DETECTED Final    Comment: (NOTE) The GeneXpert MRSA Assay (FDA approved for NASAL specimens only), is one component of a comprehensive MRSA colonization surveillance program. It is not intended to diagnose MRSA infection nor to guide or monitor treatment for MRSA infections. Test performance is not FDA approved in patients less than  35 years old. Performed at [facility], [address] [place], [place] [address]          Radiology Studies: CT CHEST ABDOMEN PELVIS W CONTRAST Result Date: 11/30/2023 CLINICAL DATA:  Sepsis EXAM: CT CHEST, ABDOMEN, AND PELVIS WITH CONTRAST TECHNIQUE: Multidetector CT imaging of the chest, abdomen and pelvis was performed following the standard protocol during bolus administration of intravenous contrast. RADIATION DOSE REDUCTION: This exam was performed according to the departmental dose-optimization program which includes automated exposure control, adjustment of the mA and/or kV according to patient size and/or use of iterative reconstruction technique. CONTRAST:  80mL OMNIPAQUE IOHEXOL 300 MG/ML  SOLN COMPARISON:  CT abdomen pelvis 12/14/2008 FINDINGS: CT CHEST FINDINGS Cardiovascular: Normal heart size. No significant pericardial effusion. The thoracic aorta is normal in caliber. At least moderate calcified and noncalcified atherosclerotic plaque of the thoracic aorta. Four-vessel coronary artery calcifications. Aortic valve leaflet calcification. Mediastinum/Nodes: No enlarged mediastinal, hilar, or axillary lymph nodes. Thyroid  gland, trachea, and esophagus demonstrate no significant findings. Lungs/Pleura: Slightly limited evaluation due to motion artifact. Bibasilar atelectasis. No focal consolidation. No pulmonary nodule. No pulmonary mass. No pleural effusion. No pneumothorax.  Musculoskeletal: No chest wall abnormality. No suspicious lytic or blastic osseous lesions. No acute displaced fracture. CT ABDOMEN PELVIS FINDINGS Hepatobiliary: No focal liver abnormality. Calcified gallstone noted within the gallbladder lumen. No gallbladder wall thickening or pericholecystic fluid. No biliary dilatation. Pancreas: No focal lesion. Normal pancreatic contour. No surrounding inflammatory changes. No main pancreatic ductal dilatation. Spleen: Normal in size without focal abnormality. Adrenals/Urinary Tract: No adrenal nodule bilaterally. Bilateral kidneys enhance symmetrically. No hydronephrosis. No hydroureter. The urinary bladder is unremarkable. Stomach/Bowel: Stomach is within normal limits. No evidence of bowel wall thickening or dilatation. Colonic diverticulosis. Appendix appears normal. Vascular/Lymphatic: No abdominal aorta or iliac aneurysm. Severe atherosclerotic plaque of the aorta and its branches. No abdominal, pelvic, or inguinal lymphadenopathy. Reproductive: Prostate is unremarkable. Other: No intraperitoneal free fluid. No intraperitoneal free gas. No organized fluid collection. Musculoskeletal: No abdominal wall hernia or abnormality. No suspicious lytic or blastic osseous lesions. No acute displaced fracture. Grade 1 anterolisthesis of L5 on S1 with associated bilateral L5 pars interarticularis defects. IMPRESSION: 1. No acute intrathoracic abnormality. 2. Cholelithiasis with no CT evidence of acute cholecystitis. 3. Colonic diverticulosis with no acute diverticulitis. 4. Aortic Atherosclerosis (ICD10-I70.0) including four-vessel coronary calcification and aortic valve leaflet calcifications-correlate for aortic stenosis. Electronically Signed   By: Morgane  Naveau M.D.   On: 11/30/2023 13:46   DG Chest Port 1 View Result Date: 11/30/2023 CLINICAL DATA:  Fall. EXAM: PORTABLE CHEST 1 VIEW COMPARISON:  02/26/2020 FINDINGS: The lungs are clear without focal pneumonia, edema,  pneumothorax or pleural effusion. Calcified granuloma noted left lower lobe is seen on CT 07/06/2020. Cardiopericardial silhouette is at upper limits of normal for size. No acute bony abnormality. Telemetry leads overlie the chest. IMPRESSION: No active disease. Electronically Signed   By: Donnal Fusi M.D.   On: 11/30/2023 11:51   DG Pelvis Portable Result Date: 11/30/2023 CLINICAL DATA:  Unwitnessed fall. EXAM: PORTABLE PELVIS 1-2 VIEWS COMPARISON:  None Available. FINDINGS: Bones are diffusely demineralized. SI joints and symphysis pubis unremarkable. No evidence for an acute fracture. No evidence for hip dislocation. IMPRESSION: Negative. Electronically Signed   By: Donnal Fusi M.D.   On: 11/30/2023 11:50   CT Head Wo Contrast Result Date: 11/30/2023 EXAM: CT HEAD AND CERVICAL SPINE 11/30/2023 11:32:55 AM TECHNIQUE: CT of the head and cervical spine was performed without the administration  of intravenous contrast. Multiplanar reformatted images are provided for review. Automated exposure control, iterative reconstruction, and/or weight based adjustment of the mA/kV was utilized to reduce the radiation dose to as low as reasonably achievable. COMPARISON: Head CT dated 02/26/2020 and cervical spine MRI dated 07/01/2011. CLINICAL HISTORY: Head trauma, minor (Age >= 65y). Trauma; Best possible images; Dementia; refused to lay on back or to keep arms down; Could not follow instructions; BIB [facility] for unwitnessed fall. States pt was found in the corner of anothers' room on the floor. No visible abrasions. Hx of dementia, diabetes (CBG 243 per EMS). Takes ASA daily. States pt was last seen at [facility] @ 0945. FINDINGS: CT HEAD BRAIN AND VENTRICLES: No acute intracranial hemorrhage. No mass effect or midline shift. No abnormal extra-axial fluid collection. No hydrocephalus. Unchanged chronic encephalomalacia of the anterior right temporal lobe and right insula. Gray-white differentiation is otherwise  maintained. Stable background of moderate-to-severe chronic small vessel disease. ORBITS: No acute abnormality. SINUSES AND MASTOIDS: No acute abnormality. SOFT TISSUES AND SKULL: No acute skull fracture. No acute soft tissue abnormality. CT CERVICAL SPINE BONES AND ALIGNMENT: No acute fracture or traumatic malalignment. DEGENERATIVE CHANGES: Interval progression of multilevel disc height loss and degenerative endplate changes with mild spinal canal stenosis at C5-6 and C6-7. SOFT TISSUES: No prevertebral soft tissue swelling. VASCULATURE: Atherosclerotic calcifications of the carotid bulbs. IMPRESSION: 1. No acute intracranial abnormality. 2. No acute fracture or traumatic malalignment of the cervical spine. 3. Interval progression of multilevel disc height loss and degenerative endplate changes with mild spinal canal stenosis at C5-6 and C6-7. Electronically signed by: Audra Blend MD 11/30/2023 11:42 AM EDT RP Workstation: [id number]   CT Cervical Spine Wo Contrast Result Date: 11/30/2023 EXAM: CT HEAD AND CERVICAL SPINE 11/30/2023 11:32:55 AM TECHNIQUE: CT of the head and cervical spine was performed without the administration of intravenous contrast. Multiplanar reformatted images are provided for review. Automated exposure control, iterative reconstruction, and/or weight based adjustment of the mA/kV was utilized to reduce the radiation dose to as low as reasonably achievable. COMPARISON: Head CT dated 02/26/2020 and cervical spine MRI dated 07/01/2011. CLINICAL HISTORY: Head trauma, minor (Age >= 65y). Trauma; Best possible images; Dementia; refused to lay on back or to keep arms down; Could not follow instructions; BIB [facility] for unwitnessed fall. States pt was found in the corner of anothers' room on the floor. No visible abrasions. Hx of dementia, diabetes (CBG 243 per EMS). Takes ASA daily. States pt was last seen at [facility] @ 0945. FINDINGS: CT HEAD BRAIN AND VENTRICLES: No acute intracranial  hemorrhage. No mass effect or midline shift. No abnormal extra-axial fluid collection. No hydrocephalus. Unchanged chronic encephalomalacia of the anterior right temporal lobe and right insula. Gray-white differentiation is otherwise maintained. Stable background of moderate-to-severe chronic small vessel disease. ORBITS: No acute abnormality. SINUSES AND MASTOIDS: No acute abnormality. SOFT TISSUES AND SKULL: No acute skull fracture. No acute soft tissue abnormality. CT CERVICAL SPINE BONES AND ALIGNMENT: No acute fracture or traumatic malalignment. DEGENERATIVE CHANGES: Interval progression of multilevel disc height loss and degenerative endplate changes with mild spinal canal stenosis at C5-6 and C6-7. SOFT TISSUES: No prevertebral soft tissue swelling. VASCULATURE: Atherosclerotic calcifications of the carotid bulbs. IMPRESSION: 1. No acute intracranial abnormality. 2. No acute fracture or traumatic malalignment of the cervical spine. 3. Interval progression of multilevel disc height loss and degenerative endplate changes with mild spinal canal stenosis at C5-6 and C6-7. Electronically signed by: Audra Blend MD 11/30/2023 11:42 AM  EDT RP Workstation: [id number]        Scheduled Meds:  aspirin EC  81 mg Oral Daily   Chlorhexidine Gluconate Cloth  6 each Topical Q0600   donepezil   5 mg Oral q AM   heparin  5,000 Units Subcutaneous Q8H   insulin  aspart  0-5 Units Subcutaneous QHS   insulin  aspart  0-9 Units Subcutaneous TID WC   levothyroxine  25 mcg Oral QAC breakfast   memantine   10 mg Oral BID   sertraline  50 mg Oral Daily   Continuous Infusions:  sodium chloride      cefTRIAXone (ROCEPHIN)  IV 2 g (12/01/23 0910)   magnesium sulfate bolus IVPB 2 g (12/01/23 1004)     LOS: 1 day    Time spent: 55 minutes    Khloey Chern D Mason Sole, DO [facility]  If 7PM-7AM, please contact night-coverage [email or web address] 12/01/2023, 10:46 AM

## 2023-12-01 NOTE — Plan of Care (Signed)
  Problem: Acute Rehab PT Goals(only PT should resolve) Goal: Pt Will Go Supine/Side To Sit Outcome: Progressing Flowsheets (Taken 12/01/2023 1449) Pt will go Supine/Side to Sit:  Independently  with modified independence Goal: Patient Will Perform Sitting Balance Outcome: Progressing Flowsheets (Taken 12/01/2023 1449) Patient will perform sitting balance:  with contact guard assist  with supervision Goal: Pt Will Transfer Bed To Chair/Chair To Bed Outcome: Progressing Flowsheets (Taken 12/01/2023 1449) Pt will Transfer Bed to Chair/Chair to Bed:  with contact guard assist  with supervision Goal: Pt Will Ambulate Outcome: Progressing Flowsheets (Taken 12/01/2023 1449) Pt will Ambulate:  75 feet  with minimal assist  with contact guard assist  with least restrictive assistive device   2:50 PM, 12/01/23 Walton Guppy, MPT Physical Therapist with [facility] [phone] office [phone] mobile phone

## 2023-12-01 NOTE — Evaluation (Signed)
 Physical Therapy Evaluation Patient Details Name: William Fisher MRN: [id number] DOB: 06-07-1932 Today's Date: 12/01/2023  History of Present Illness  William Fisher is a 89 y.o. male with medical history significant for dementia, type 2 diabetes, isolated paroxysmal atrial fibrillation, and CAD who presented to the ED from [facility]F upon being found on the ground by a resident.  He was supposedly on the ground for about 10 minutes or so before he was found and there was no loss of consciousness or reported head trauma.  Nursing staff states that he has had gradual decline over the past several weeks with more restricted food and liquid intake.  No reports of fever, vomiting, diarrhea, urinary symptoms, or cough noted.  Patient at baseline only shakes his head yes or no to certain questions and is otherwise not very responsive.  It is therefore difficult to obtain any further relevant history.   Clinical Impression  Patient frequently impulsive possibly due to very HOH, has to lean on walls nearby objects when taking steps without AD, required constant hand held assist for safety while ambulating in hallway with frequent scissoring of legs and limited mostly due to fatigue. Patient tolerated sitting up in chair after therapy with his son present. Patient will benefit from continued skilled physical therapy in hospital and recommended venue below to increase strength, balance, endurance for safe ADLs and gait.        If plan is discharge home, recommend the following: A lot of help with walking and/or transfers;A little help with bathing/dressing/bathroom;Help with stairs or ramp for entrance;Assistance with cooking/housework   Can travel by private vehicle   Yes    Equipment Recommendations Rolling walker (2 wheels)  Recommendations for Other Services       Functional Status Assessment Patient has had a recent decline in their functional status and demonstrates the ability to make  significant improvements in function in a reasonable and predictable amount of time.     Precautions / Restrictions Precautions Precautions: Fall Recall of Precautions/Restrictions: Impaired Restrictions Weight Bearing Restrictions Per Provider Order: No      Mobility  Bed Mobility Overal bed mobility: Needs Assistance Bed Mobility: Supine to Sit, Sit to Supine     Supine to sit: Supervision Sit to supine: Supervision   General bed mobility comments: slightly labored movment    Transfers Overall transfer level: Needs assistance Equipment used: 1 person hand held assist Transfers: Sit to/from Stand, Bed to chair/wheelchair/BSC Sit to Stand: Min assist   Step pivot transfers: Min assist       General transfer comment: unsteady labored movement having to lean on nearby objects for support and/or hand held assist    Ambulation/Gait Ambulation/Gait assistance: Min assist, Mod assist Gait Distance (Feet): 40 Feet Assistive device: 1 person hand held assist Gait Pattern/deviations: Decreased step length - right, Decreased step length - left, Decreased stride length, Scissoring Gait velocity: decreased     General Gait Details: slow labored movement with frequent scissoring of legs requiring hand held assist to avoid loss of balance and limited mostly due to fatigue  Stairs            Wheelchair Mobility     Tilt Bed    Modified Rankin (Stroke Patients Only)       Balance Overall balance assessment: Needs assistance Sitting-balance support: Feet supported, No upper extremity supported Sitting balance-Leahy Scale: Fair Sitting balance - Comments: fair/good seated at EOB   Standing balance support: During functional activity, No  upper extremity supported Standing balance-Leahy Scale: Poor Standing balance comment: fair/poor with hand held assist                             Pertinent Vitals/Pain Pain Assessment Pain Assessment: No/denies  pain    Home Living Family/patient expects to be discharged to:: Assisted living                 Home Equipment: None      Prior Function Prior Level of Function : Needs assist       Physical Assist : Mobility (physical);ADLs (physical) Mobility (physical): Bed mobility;Transfers;Gait;Stairs   Mobility Comments: Household  ambulation without AD ADLs Comments: Walks to dining room, bathroom, without assist, ALF staff does cooking, helps with some household ADLs     Extremity/Trunk Assessment   Upper Extremity Assessment Upper Extremity Assessment: Defer to OT evaluation    Lower Extremity Assessment Lower Extremity Assessment: Generalized weakness    Cervical / Trunk Assessment Cervical / Trunk Assessment: Normal  Communication   Communication Factors Affecting Communication: Hearing impaired (have to speak loudly in right ear)    Cognition Arousal: Alert Behavior During Therapy: WFL for tasks assessed/performed, Impulsive   PT - Cognitive impairments: History of cognitive impairments                         Following commands: Impaired Following commands impaired: Follows one step commands with increased time, Follows multi-step commands inconsistently     Cueing Cueing Techniques: Verbal cues, Tactile cues     General Comments      Exercises     Assessment/Plan    PT Assessment Patient needs continued PT services  PT Problem List Decreased strength;Decreased activity tolerance;Decreased balance;Decreased mobility;Decreased coordination       PT Treatment Interventions DME instruction;Gait training;Stair training;Therapeutic activities;Functional mobility training;Therapeutic exercise;Balance training;Patient/family education    PT Goals (Current goals can be found in the Care Plan section)  Acute Rehab PT Goals Patient Stated Goal: return home PT Goal Formulation: With patient/family Time For Goal Achievement: 12/15/23 Potential to  Achieve Goals: Good    Frequency Min 3X/week     Co-evaluation               AM-PAC PT "6 Clicks" Mobility  Outcome Measure Help needed turning from your back to your side while in a flat bed without using bedrails?: None Help needed moving from lying on your back to sitting on the side of a flat bed without using bedrails?: A Little Help needed moving to and from a bed to a chair (including a wheelchair)?: A Little Help needed standing up from a chair using your arms (e.g., wheelchair or bedside chair)?: A Little Help needed to walk in hospital room?: A Lot Help needed climbing 3-5 steps with a railing? : A Lot 6 Click Score: 17    End of Session   Activity Tolerance: Patient tolerated treatment well;Patient limited by fatigue Patient left: in chair;with call bell/phone within reach;with chair alarm set;with family/visitor present Nurse Communication: Mobility status PT Visit Diagnosis: Unsteadiness on feet (R26.81);Other abnormalities of gait and mobility (R26.89);Muscle weakness (generalized) (M62.81)    Time: 1610-9604 PT Time Calculation (min) (ACUTE ONLY): 13 min   Charges:   PT Evaluation $PT Eval Low Complexity: 1 Low PT Treatments $Therapeutic Activity: 8-22 mins PT General Charges $$ ACUTE PT VISIT: 1 Visit  2:48 PM, 12/01/23 Walton Guppy, MPT Physical Therapist with [facility] [phone] office [phone] mobile phone

## 2023-12-01 NOTE — TOC Initial Note (Signed)
 Transition of Care [facility]) - Initial/Assessment Note    Patient Details  Name: William Fisher DOBROWSKI MRN: [id number] Date of Birth: 10-07-1931  [facility]) CM/SW Contact:    Ander Katos, LCSW Phone Number: 12/01/2023, 2:55 PM  Clinical Narrative:  Pt admitted from [facility] ALF memory care unit. Per son, Debria Fang he has spoken with palliative and Caren Channel PA today. He is aware hospice has been recommended and is agreeable, but his 3 brothers would also like to discuss with MD. Dr. Mason Sole notified and will contact family to arrange. Per Irwin Manual at [facility], facility may be able to accept with hospice. TOC will follow up when decision is made by family to see if ALF will need to assess. Debria Fang requested referral be made to [facility] so they could start conversation with hospice. Family may be open to residential hospice facility. Referral made and Samantha at [facility] notified. TOC will follow up in AM.                  Expected Discharge Plan:  (to be determined) Barriers to Discharge: Continued Medical Work up   Patient Goals and CMS Choice Patient states their goals for this hospitalization and ongoing recovery are:: to be determined   Choice offered to / list presented to : Adult Children [place] ownership interest in [facility].provided to::  (n/a)    Expected Discharge Plan and Services In-house Referral: Clinical Social Work     Living arrangements for the past 2 months: Assisted Living Facility                                      Prior Living Arrangements/Services Living arrangements for the past 2 months: Assisted Living Facility Lives with:: Facility Resident Patient language and need for interpreter reviewed:: Yes        Need for Family Participation in Patient Care: Yes (Comment) Care giver support system in place?: Yes (comment)   Criminal Activity/Legal Involvement Pertinent to Current Situation/Hospitalization: No - Comment as  needed  Activities of Daily Living   ADL Screening (condition at time of admission) Independently performs ADLs?: No Does the patient have a NEW difficulty with bathing/dressing/toileting/self-feeding that is expected to last >3 days?: Yes (Initiates electronic notice to provider for possible OT consult) Does the patient have a NEW difficulty with getting in/out of bed, walking, or climbing stairs that is expected to last >3 days?: Yes (Initiates electronic notice to provider for possible PT consult) Does the patient have a NEW difficulty with communication that is expected to last >3 days?: Yes (Initiates electronic notice to provider for possible SLP consult) Is the patient deaf or have difficulty hearing?: Yes Does the patient have difficulty seeing, even when wearing glasses/contacts?: No Does the patient have difficulty concentrating, remembering, or making decisions?: Yes  Permission Sought/Granted   Permission granted to share information with : Yes, Verbal Permission Granted     Permission granted to share info w AGENCY: [facility]  Permission granted to share info w Relationship: hospice     Emotional Assessment   Attitude/Demeanor/Rapport: Unable to Assess Affect (typically observed): Unable to Assess Orientation: : Oriented to Self Alcohol / Substance Use: Not Applicable Psych Involvement: No (comment)  Admission diagnosis:  SIRS (systemic inflammatory response syndrome) (HCC) [R65.10] Fall, initial encounter [W19.XXXA] Patient Active Problem List   Diagnosis Date Noted   SIRS (systemic inflammatory response syndrome) (  HCC) 11/30/2023   Dementia (HCC) 11/22/2020   HOH (hard of hearing) 11/22/2020   Acute metabolic encephalopathy 02/26/2020   Pneumonia due to COVID-19 virus 02/26/2020   Essential hypertension 02/26/2020   Memory change 07/13/2014   DIVERTICULITIS-COLON 03/01/2009   HYPERTENSION 09/14/2007   Type II or unspecified type diabetes mellitus without mention  of complication, not stated as uncontrolled 09/11/2007   PAROXYSMAL ATRIAL FIBRILLATION 09/11/2007   PULMONARY NODULE 08/17/2007   PCP:  Barnetta Liberty, MD Pharmacy:   [facility] [phone] - [place], [place] - [address] AT [facility] OF [address] [place] [address] Phone: [phone] Fax: [phone]  [facility] - [place], [place] - [place] [address] [place] [address] Phone: [phone] Fax: [phone]     Social Drivers of Health (SDOH) Social History: SDOH Screenings   Food Insecurity: No Food Insecurity (11/30/2023)  Housing: Low Risk  (11/30/2023)  Transportation Needs: No Transportation Needs (11/30/2023)  Utilities: Not At Risk (11/30/2023)  Social Connections: Unknown (11/30/2023)  Tobacco Use: Low Risk  (11/30/2023)   SDOH Interventions:     Readmission Risk Interventions     No data to display

## 2023-12-01 NOTE — Progress Notes (Signed)
 PHARMACY - PHYSICIAN COMMUNICATION CRITICAL VALUE ALERT - BLOOD CULTURE IDENTIFICATION (BCID)  William Fisher is an 89 y.o. male who presented to [facility] on 11/30/2023   Assessment:  BCID w/ strep species   Current antibiotics: Ceftriaxone 2000 mg IV every 24 hours  Changes to prescribed antibiotics recommended:  Patient is on recommended antibiotics - No changes needed  Results for orders placed or performed during the hospital encounter of 11/30/23  Blood Culture ID Panel (Reflexed) (Collected: 11/30/2023 11:53 AM)  Result Value Ref Range   Enterococcus faecalis NOT DETECTED NOT DETECTED   Enterococcus Faecium NOT DETECTED NOT DETECTED   Listeria monocytogenes NOT DETECTED NOT DETECTED   Staphylococcus species NOT DETECTED NOT DETECTED   Staphylococcus aureus (BCID) NOT DETECTED NOT DETECTED   Staphylococcus epidermidis NOT DETECTED NOT DETECTED   Staphylococcus lugdunensis NOT DETECTED NOT DETECTED   Streptococcus species DETECTED (A) NOT DETECTED   Streptococcus agalactiae NOT DETECTED NOT DETECTED   Streptococcus pneumoniae NOT DETECTED NOT DETECTED   Streptococcus pyogenes NOT DETECTED NOT DETECTED   A.calcoaceticus-baumannii NOT DETECTED NOT DETECTED   Bacteroides fragilis NOT DETECTED NOT DETECTED   Enterobacterales NOT DETECTED NOT DETECTED   Enterobacter cloacae complex NOT DETECTED NOT DETECTED   Escherichia coli NOT DETECTED NOT DETECTED   Klebsiella aerogenes NOT DETECTED NOT DETECTED   Klebsiella oxytoca NOT DETECTED NOT DETECTED   Klebsiella pneumoniae NOT DETECTED NOT DETECTED   Proteus species NOT DETECTED NOT DETECTED   Salmonella species NOT DETECTED NOT DETECTED   Serratia marcescens NOT DETECTED NOT DETECTED   Haemophilus influenzae NOT DETECTED NOT DETECTED   Neisseria meningitidis NOT DETECTED NOT DETECTED   Pseudomonas aeruginosa NOT DETECTED NOT DETECTED   Stenotrophomonas maltophilia NOT DETECTED NOT DETECTED   Candida albicans NOT DETECTED NOT  DETECTED   Candida auris NOT DETECTED NOT DETECTED   Candida glabrata NOT DETECTED NOT DETECTED   Candida krusei NOT DETECTED NOT DETECTED   Candida parapsilosis NOT DETECTED NOT DETECTED   Candida tropicalis NOT DETECTED NOT DETECTED   Cryptococcus neoformans/gattii NOT DETECTED NOT DETECTED    Alicia Apa 12/01/2023  1:50 PM

## 2023-12-02 DIAGNOSIS — Z7189 Other specified counseling: Secondary | ICD-10-CM | POA: Diagnosis not present

## 2023-12-02 DIAGNOSIS — R6510 Systemic inflammatory response syndrome (SIRS) of non-infectious origin without acute organ dysfunction: Secondary | ICD-10-CM | POA: Diagnosis not present

## 2023-12-02 DIAGNOSIS — N390 Urinary tract infection, site not specified: Secondary | ICD-10-CM | POA: Diagnosis not present

## 2023-12-02 DIAGNOSIS — E876 Hypokalemia: Secondary | ICD-10-CM

## 2023-12-02 DIAGNOSIS — Z515 Encounter for palliative care: Secondary | ICD-10-CM | POA: Diagnosis not present

## 2023-12-02 DIAGNOSIS — N179 Acute kidney failure, unspecified: Secondary | ICD-10-CM | POA: Diagnosis not present

## 2023-12-02 DIAGNOSIS — E871 Hypo-osmolality and hyponatremia: Secondary | ICD-10-CM | POA: Diagnosis not present

## 2023-12-02 DIAGNOSIS — R627 Adult failure to thrive: Secondary | ICD-10-CM | POA: Diagnosis not present

## 2023-12-02 LAB — BASIC METABOLIC PANEL WITH GFR
Anion gap: 7 (ref 5–15)
BUN: 21 mg/dL (ref 8–23)
CO2: 22 mmol/L (ref 22–32)
Calcium: 8.3 mg/dL — ABNORMAL LOW (ref 8.9–10.3)
Chloride: 103 mmol/L (ref 98–111)
Creatinine, Ser: 1.34 mg/dL — ABNORMAL HIGH (ref 0.61–1.24)
GFR, Estimated: 50 mL/min — ABNORMAL LOW (ref >60)
Glucose, Bld: 85 mg/dL (ref 70–99)
Potassium: 3.3 mmol/L — ABNORMAL LOW (ref 3.5–5.1)
Sodium: 132 mmol/L — ABNORMAL LOW (ref 135–145)

## 2023-12-02 LAB — CBC
HCT: 27.4 % — ABNORMAL LOW (ref 39.0–52.0)
Hemoglobin: 8.9 g/dL — ABNORMAL LOW (ref 13.0–17.0)
MCH: 29.4 pg (ref 26.0–34.0)
MCHC: 32.5 g/dL (ref 30.0–36.0)
MCV: 90.4 fL (ref 80.0–100.0)
Platelets: 212 10*3/uL (ref 150–400)
RBC: 3.03 MIL/uL — ABNORMAL LOW (ref 4.22–5.81)
RDW: 14 % (ref 11.5–15.5)
WBC: 8.8 10*3/uL (ref 4.0–10.5)
nRBC: 0 % (ref 0.0–0.2)

## 2023-12-02 LAB — MAGNESIUM: Magnesium: 1.8 mg/dL (ref 1.7–2.4)

## 2023-12-02 LAB — GLUCOSE, CAPILLARY
Glucose-Capillary: 86 mg/dL (ref 70–99)
Glucose-Capillary: 108 mg/dL — ABNORMAL HIGH (ref 70–99)
Glucose-Capillary: 86 mg/dL (ref 70–99)

## 2023-12-02 MED ORDER — GLYCOPYRROLATE 0.2 MG/ML IJ SOLN: 0.2000 mg | INTRAMUSCULAR | Status: DC | PRN

## 2023-12-02 MED ORDER — FENTANYL CITRATE PF 50 MCG/ML IJ SOSY: 12.5000 ug | PREFILLED_SYRINGE | INTRAMUSCULAR | Status: DC | PRN

## 2023-12-02 MED ORDER — QUETIAPINE FUMARATE 25 MG PO TABS
25.0000 mg | ORAL_TABLET | Freq: Every day | ORAL | Status: DC
  Administered 2023-12-02: 25 mg via ORAL
  Filled 2023-12-02: qty 1

## 2023-12-02 MED ORDER — GLYCOPYRROLATE 0.2 MG/ML IJ SOLN
0.2000 mg | INTRAMUSCULAR | Status: DC | PRN
Start: 2023-12-02 — End: 2023-12-03

## 2023-12-02 MED ORDER — POLYVINYL ALCOHOL 1.4 % OP SOLN: 1.0000 [drp] | Freq: Four times a day (QID) | OPHTHALMIC | Status: DC | PRN

## 2023-12-02 MED ORDER — BIOTENE DRY MOUTH MT LIQD: 15.0000 mL | OROMUCOSAL | Status: DC | PRN

## 2023-12-02 MED ORDER — HALOPERIDOL LACTATE 5 MG/ML IJ SOLN: 2.0000 mg | Freq: Four times a day (QID) | INTRAMUSCULAR | Status: DC | PRN

## 2023-12-02 MED ORDER — POTASSIUM CHLORIDE CRYS ER 20 MEQ PO TBCR
40.0000 meq | EXTENDED_RELEASE_TABLET | Freq: Once | ORAL | Status: AC
  Administered 2023-12-02: 40 meq via ORAL
  Filled 2023-12-02: qty 2

## 2023-12-02 MED ORDER — GLYCOPYRROLATE 1 MG PO TABS: 1.0000 mg | ORAL_TABLET | ORAL | Status: DC | PRN

## 2023-12-02 NOTE — Progress Notes (Signed)
 PROGRESS NOTE    William Fisher  ZOX:[id number] DOB: April 10, 1932 DOA: 11/30/2023 PCP: Barnetta Liberty, MD   Brief Narrative:    William Fisher is a 89 y.o. male with medical history significant for dementia, type 2 diabetes, isolated paroxysmal atrial fibrillation, and CAD who presented to the ED from [facility] upon being found on the ground by a resident.  Patient was admitted with fall likely secondary to orthostasis with dehydration and mild AKI.  He was noted to meet SIRS criteria with lactic acidosis and has history of recent UTI and was started on Rocephin empirically as well as IV fluid.  Appears to be failing to thrive and family members contemplating hospice soon.  Palliative consulted and patient is now comfort measures only after discussion with family member 6/3.  Awaiting [facility] for assessment for hospice placement there on 6/4.  Assessment & Plan:   Principal Problem:   SIRS (systemic inflammatory response syndrome) (HCC) Active Problems:   PAROXYSMAL ATRIAL FIBRILLATION   Essential hypertension   Dementia (HCC)  Assessment and Plan:   SIRS with lactic acidosis-improved - Noted to have recent UTI and has completed 3-day course of Rocephin   Mild AKI with mild hyponatremia-improving - Continues to have poor oral intake -Now comfort care only   Fall - Fall precautions - PT/OT  Hypokalemia -Repleted today   Dementia - Currently on Namenda  and Aricept  - TSH of 4   Type 2 diabetes with hyperglycemia - SSI -A1c 7.7% - Hold home oral agents   Hypertension - Hold home antihypertensives with soft blood pressure readings   Patient is now comfort care only and is awaiting hospice evaluation at his ALF in a.m.  Anticipate discharge 6/4 to either [facility] with hospice or [facility].   DVT prophylaxis:Heparin Code Status: DNR Family Communication: Multiple sons in conference 6/3 Disposition Plan:  Status is: Inpatient Remains inpatient appropriate  because: Continue IV fluid.   Consultants:  Palliative  Procedures:  None  Antimicrobials:  Anti-infectives (From admission, onward)    Start     Dose/Rate Route Frequency Ordered Stop   12/01/23 1000  cefTRIAXone (ROCEPHIN) 2 g in sodium chloride  0.9 % 100 mL IVPB  Status:  Discontinued        2 g 200 mL/hr over 30 Minutes Intravenous Every 24 hours 11/30/23 1519 12/02/23 1506   11/30/23 1130  cefTRIAXone (ROCEPHIN) 2 g in sodium chloride  0.9 % 100 mL IVPB        2 g 200 mL/hr over 30 Minutes Intravenous  Once 11/30/23 1119 11/30/23 1236      Subjective: Patient seen and evaluated today and is currently sleep with no acute overnight events noted.  Conference had with family members regarding hospice placement and they are agreeable.  Objective: Vitals:   12/01/23 1818 12/01/23 2139 12/02/23 0219 12/02/23 0623  BP: 136/89 111/72 131/79 120/70  Pulse: (!) 102 (!) 108 96 92  Resp: 19 20  20   Temp: 98.4 F (36.9 C) 98.1 F (36.7 C) (!) 97.5 F (36.4 C) (!) 97.5 F (36.4 C)  TempSrc: Oral Oral Oral Oral  SpO2: 99% 99% 97% 100%  Weight:      Height:        Intake/Output Summary (Last 24 hours) at 12/02/2023 1506 Last data filed at 12/02/2023 1300 Gross per 24 hour  Intake 220 ml  Output 625 ml  Net -405 ml   Filed Weights   11/30/23 1042 11/30/23 1546 12/01/23 8119  Weight: 68 kg 70.2 kg 70.2 kg    Examination:  General exam: Appears calm and comfortable, somnolent Respiratory system: Clear to auscultation. Respiratory effort normal. Cardiovascular system: S1 & S2 heard, RRR.  Gastrointestinal system: Abdomen is soft Central nervous system: Somnolent Extremities: No edema Skin: No significant lesions noted Psychiatry: Flat affect.    Data Reviewed: I have personally reviewed following labs and imaging studies  CBC: Recent Labs  Lab 11/30/23 1153 12/01/23 0340 12/02/23 0519  WBC 14.4* 9.1 8.8  NEUTROABS 12.1*  --   --   HGB 9.5* 8.5* 8.9*  HCT 29.1*  26.5* 27.4*  MCV 89.8 90.4 90.4  PLT 228 160 212   Basic Metabolic Panel: Recent Labs  Lab 11/30/23 1153 12/01/23 0340 12/02/23 0519  NA 134* 137 132*  K 4.4 4.1 3.3*  CL 100 107 103  CO2 20* 23 22  GLUCOSE 179* 119* 85  BUN 36* 28* 21  CREATININE 1.94* 1.51* 1.34*  CALCIUM 9.3 8.5* 8.3*  MG  --  1.6* 1.8   GFR: Estimated Creatinine Clearance: 36.4 mL/min (A) (by C-G formula based on SCr of 1.34 mg/dL (H)). Liver Function Tests: Recent Labs  Lab 11/30/23 1153  AST 38  ALT 27  ALKPHOS 71  BILITOT 0.4  PROT 7.0  ALBUMIN 2.6*   Recent Labs  Lab 11/30/23 1153  LIPASE 37   No results for input(s): "AMMONIA" in the last 168 hours. Coagulation Profile: Recent Labs  Lab 11/30/23 1153  INR 1.3*   Cardiac Enzymes: Recent Labs  Lab 11/30/23 1518  CKTOTAL 112   BNP (last 3 results) No results for input(s): "PROBNP" in the last 8760 hours. HbA1C: Recent Labs    11/30/23 1547  HGBA1C 7.7*   CBG: Recent Labs  Lab 12/01/23 1130 12/01/23 1623 12/01/23 2142 12/02/23 0730 12/02/23 1113  GLUCAP 128* 122* 103* 86 108*   Lipid Profile: No results for input(s): "CHOL", "HDL", "LDLCALC", "TRIG", "CHOLHDL", "LDLDIRECT" in the last 72 hours. Thyroid  Function Tests: Recent Labs    11/30/23 1519  TSH 4.099   Anemia Panel: No results for input(s): "VITAMINB12", "FOLATE", "FERRITIN", "TIBC", "IRON", "RETICCTPCT" in the last 72 hours. Sepsis Labs: Recent Labs  Lab 11/30/23 1153 11/30/23 1425  LATICACIDVEN 4.1* 2.5*    Recent Results (from the past 240 hours)  Resp panel by RT-PCR (RSV, Flu A&B, Covid) Anterior Nasal Swab     Status: None   Collection Time: 11/30/23 10:48 AM   Specimen: Anterior Nasal Swab  Result Value Ref Range Status   SARS Coronavirus 2 by RT PCR NEGATIVE NEGATIVE Final    Comment: (NOTE) SARS-CoV-2 target nucleic acids are NOT DETECTED.  The SARS-CoV-2 RNA is generally detectable in upper respiratory specimens during the acute  phase of infection. The lowest concentration of SARS-CoV-2 viral copies this assay can detect is 138 copies/mL. A negative result does not preclude SARS-Cov-2 infection and should not be used as the sole basis for treatment or other patient management decisions. A negative result may occur with  improper specimen collection/handling, submission of specimen other than nasopharyngeal swab, presence of viral mutation(s) within the areas targeted by this assay, and inadequate number of viral copies(<138 copies/mL). A negative result must be combined with clinical observations, patient history, and epidemiological information. The expected result is Negative.  Fact Sheet for Patients:  BloggerCourse.com  Fact Sheet for Healthcare Providers:  SeriousBroker.it  This test is no t yet approved or cleared by the [place]  FDA and  has  been authorized for detection and/or diagnosis of SARS-CoV-2 by FDA under an Emergency Use Authorization (EUA). This EUA will remain  in effect (meaning this test can be used) for the duration of the COVID-19 declaration under Section 564(b)(1) of the Act, 21 U.S.C.section 360bbb-3(b)(1), unless the authorization is terminated  or revoked sooner.       Influenza A by PCR NEGATIVE NEGATIVE Final   Influenza B by PCR NEGATIVE NEGATIVE Final    Comment: (NOTE) The Xpert Xpress SARS-CoV-2/FLU/RSV plus assay is intended as an aid in the diagnosis of influenza from Nasopharyngeal swab specimens and should not be used as a sole basis for treatment. Nasal washings and aspirates are unacceptable for Xpert Xpress SARS-CoV-2/FLU/RSV testing.  Fact Sheet for Patients: BloggerCourse.com  Fact Sheet for Healthcare Providers: SeriousBroker.it  This test is not yet approved or cleared by the [place]  FDA and has been authorized for detection and/or diagnosis of  SARS-CoV-2 by FDA under an Emergency Use Authorization (EUA). This EUA will remain in effect (meaning this test can be used) for the duration of the COVID-19 declaration under Section 564(b)(1) of the Act, 21 U.S.C. section 360bbb-3(b)(1), unless the authorization is terminated or revoked.     Resp Syncytial Virus by PCR NEGATIVE NEGATIVE Final    Comment: (NOTE) Fact Sheet for Patients: BloggerCourse.com  Fact Sheet for Healthcare Providers: SeriousBroker.it  This test is not yet approved or cleared by the [place]  FDA and has been authorized for detection and/or diagnosis of SARS-CoV-2 by FDA under an Emergency Use Authorization (EUA). This EUA will remain in effect (meaning this test can be used) for the duration of the COVID-19 declaration under Section 564(b)(1) of the Act, 21 U.S.C. section 360bbb-3(b)(1), unless the authorization is terminated or revoked.  Performed at [facility], [address] [place], [place] [address]   Blood Culture (routine x 2)     Status: Abnormal (Preliminary result)   Collection Time: 11/30/23 11:53 AM   Specimen: BLOOD  Result Value Ref Range Status   Specimen Description   Final    BLOOD BLOOD LEFT ARM Performed at [facility], [address] [place], [place] [address]    Special Requests   Final    BOTTLES DRAWN AEROBIC AND ANAEROBIC Blood Culture adequate volume Performed at [facility], [address], [place], [place] [address]    Culture  Setup Time   Final    AEROBIC BOTTLE ONLY GRAM POSITIVE COCCI Gram Stain Report Called to,Read Back By and Verified With: A SHELTON AT 0951 12/01/23 BY A WILSON CRITICAL RESULT CALLED TO, READ BACK BY AND VERIFIED WITH: PHARMD STEVEN HURTH ON 12/01/23 @ 1343 BY DRT    Culture (A)  Final    STREPTOCOCCUS MUTANS SUSCEPTIBILITIES TO FOLLOW Performed at [facility], [address], [place], [place] [address]    Report Status PENDING   Incomplete  Blood Culture ID Panel (Reflexed)     Status: Abnormal   Collection Time: 11/30/23 11:53 AM  Result Value Ref Range Status   Enterococcus faecalis NOT DETECTED NOT DETECTED Final   Enterococcus Faecium NOT DETECTED NOT DETECTED Final   Listeria monocytogenes NOT DETECTED NOT DETECTED Final   Staphylococcus species NOT DETECTED NOT DETECTED Final   Staphylococcus aureus (BCID) NOT DETECTED NOT DETECTED Final   Staphylococcus epidermidis NOT DETECTED NOT DETECTED Final   Staphylococcus lugdunensis NOT DETECTED NOT DETECTED Final   Streptococcus species DETECTED (A) NOT DETECTED Final    Comment: Not Enterococcus species,  Streptococcus agalactiae, Streptococcus pyogenes, or Streptococcus pneumoniae. CRITICAL RESULT CALLED TO, READ BACK BY AND VERIFIED WITH: PHARMD STEVEN HURTH ON 12/01/23 @ 1343 BY DRT    Streptococcus agalactiae NOT DETECTED NOT DETECTED Final   Streptococcus pneumoniae NOT DETECTED NOT DETECTED Final   Streptococcus pyogenes NOT DETECTED NOT DETECTED Final   A.calcoaceticus-baumannii NOT DETECTED NOT DETECTED Final   Bacteroides fragilis NOT DETECTED NOT DETECTED Final   Enterobacterales NOT DETECTED NOT DETECTED Final   Enterobacter cloacae complex NOT DETECTED NOT DETECTED Final   Escherichia coli NOT DETECTED NOT DETECTED Final   Klebsiella aerogenes NOT DETECTED NOT DETECTED Final   Klebsiella oxytoca NOT DETECTED NOT DETECTED Final   Klebsiella pneumoniae NOT DETECTED NOT DETECTED Final   Proteus species NOT DETECTED NOT DETECTED Final   Salmonella species NOT DETECTED NOT DETECTED Final   Serratia marcescens NOT DETECTED NOT DETECTED Final   Haemophilus influenzae NOT DETECTED NOT DETECTED Final   Neisseria meningitidis NOT DETECTED NOT DETECTED Final   Pseudomonas aeruginosa NOT DETECTED NOT DETECTED Final   Stenotrophomonas maltophilia NOT DETECTED NOT DETECTED Final   Candida albicans NOT DETECTED NOT DETECTED Final   Candida auris NOT DETECTED  NOT DETECTED Final   Candida glabrata NOT DETECTED NOT DETECTED Final   Candida krusei NOT DETECTED NOT DETECTED Final   Candida parapsilosis NOT DETECTED NOT DETECTED Final   Candida tropicalis NOT DETECTED NOT DETECTED Final   Cryptococcus neoformans/gattii NOT DETECTED NOT DETECTED Final    Comment: Performed at [facility], [address] [place], [place] [address]  Blood Culture (routine x 2)     Status: None (Preliminary result)   Collection Time: 11/30/23  2:25 PM   Specimen: BLOOD  Result Value Ref Range Status   Specimen Description BLOOD BLOOD RIGHT ARM  Final   Special Requests   Final    BOTTLES DRAWN AEROBIC AND ANAEROBIC Blood Culture adequate volume   Culture   Final    NO GROWTH 2 DAYS Performed at [facility], [address], [place], [place] [address]    Report Status PENDING  Incomplete  MRSA Next Gen by PCR, Nasal     Status: None   Collection Time: 11/30/23  3:45 PM   Specimen: Nasal Mucosa; Nasal Swab  Result Value Ref Range Status   MRSA by PCR Next Gen NOT DETECTED NOT DETECTED Final    Comment: (NOTE) The GeneXpert MRSA Assay (FDA approved for NASAL specimens only), is one component of a comprehensive MRSA colonization surveillance program. It is not intended to diagnose MRSA infection nor to guide or monitor treatment for MRSA infections. Test performance is not FDA approved in patients less than 13 years old. Performed at [facility], [address], [place], [place] [address]          Radiology Studies: No results found.       Scheduled Meds:  aspirin EC  81 mg Oral Daily   Chlorhexidine Gluconate Cloth  6 each Topical Q0600   donepezil   5 mg Oral q AM   heparin  5,000 Units Subcutaneous Q8H   insulin  aspart  0-5 Units Subcutaneous QHS   insulin  aspart  0-9 Units Subcutaneous TID WC   levothyroxine  50 mcg Oral QAC breakfast   memantine   10 mg Oral BID   QUEtiapine  25 mg Oral QHS   sertraline  50 mg Oral Daily      LOS:  2 days    Time spent: 55 minutes  Jayma Volpi Loran Rock, DO [facility]  If 7PM-7AM, please contact night-coverage [email or web address] 12/02/2023, 3:06 PM

## 2023-12-02 NOTE — TOC Progression Note (Signed)
 Transition of Care [facility]) - Progression Note    Patient Details  Name: William Fisher MRN: [id number] Date of Birth: 11-06-1931  [facility] CM/SW Contact  Ander Katos, [place] Phone Number: 12/02/2023, 3:04 PM  Clinical Narrative:  Per hospice, family would like pt to return to [facility] with hospice if possible. LCSW confirmed with Caren Channel they will be here at 10:00 to assess tomorrow morning. If [facility] cannot accept him back, there is a bed at [facility] and they are holding it for him. [facility] will update TOC in AM.     Expected Discharge Plan:  (to be determined) Barriers to Discharge: Continued Medical Work up  Expected Discharge Plan and Services In-house Referral: Clinical Social Work     Living arrangements for the past 2 months: Assisted Living Facility                                       Social Determinants of Health (SDOH) Interventions SDOH Screenings   Food Insecurity: No Food Insecurity (11/30/2023)  Housing: Low Risk  (11/30/2023)  Transportation Needs: No Transportation Needs (11/30/2023)  Utilities: Not At Risk (11/30/2023)  Social Connections: Unknown (11/30/2023)  Tobacco Use: Low Risk  (11/30/2023)    Readmission Risk Interventions     No data to display

## 2023-12-02 NOTE — Progress Notes (Signed)
 Palliative:  HPI: 89 y.o. male  with past medical history of dementia, type 2 diabetes, paroxysmal atrial fibrillation, hyponatremia, CAD, HLD, orthostatic hypotension, diverticulosis admitted on 11/30/2023 from [facility] with fall found to have SIRS likely due to UTI.    I met today with William Fisher 4 sons (1 via phone as well as daughter-in-law via phone). We were joined by Dr. Mason Sole. We discussed William Fisher trajectory of decline and leading up to hospitalization as well as ongoing poor progress. We discussed poor intake and overall failure to thrive. We reviewed poor prognosis and potential of days to weeks most likely with poor intake. We reviewed increased symptom burden with agitation last night. Family are interested in hospice options especially hospice facility. We reviewed process of hospice evaluation and they will help determine the best venue to receive hospice care.   Family all agree with pursuing hospice care at this time. They also all agree that his comfort and symptom management is priority. We discussed medication available to assist with agitation especially in the evening time to allow for more comfort. We reviewed further focus of hospice care on symptoms and comfort and not therapies or treating numbers/labs. Family are accepting that William Fisher is entering the end of his life and want him comfortable and safe during this time.   All questions/concerns addressed. Emotional support provided.   I discussed with [facility] liaison who happened to be on unit evaluating another patient.   Exam: Sleeping soundly. No distress. Sundowning. Abd soft. Warm to touch.   Plan: - DNR/DNI - Comfort focused care - Family hopeful for [facility]  50 min  Vila Grayer, NP Palliative Medicine Team Pager [phone] (Please see [email or web address] for schedule) Team Phone [phone]

## 2023-12-02 NOTE — Plan of Care (Signed)

## 2023-12-03 DIAGNOSIS — F02B0 Dementia in other diseases classified elsewhere, moderate, without behavioral disturbance, psychotic disturbance, mood disturbance, and anxiety: Secondary | ICD-10-CM | POA: Diagnosis not present

## 2023-12-03 DIAGNOSIS — I480 Paroxysmal atrial fibrillation: Secondary | ICD-10-CM | POA: Diagnosis not present

## 2023-12-03 DIAGNOSIS — G301 Alzheimer's disease with late onset: Secondary | ICD-10-CM

## 2023-12-03 DIAGNOSIS — R6510 Systemic inflammatory response syndrome (SIRS) of non-infectious origin without acute organ dysfunction: Secondary | ICD-10-CM | POA: Diagnosis not present

## 2023-12-03 DIAGNOSIS — Z515 Encounter for palliative care: Secondary | ICD-10-CM | POA: Diagnosis not present

## 2023-12-03 DIAGNOSIS — R627 Adult failure to thrive: Secondary | ICD-10-CM | POA: Diagnosis not present

## 2023-12-03 LAB — GLUCOSE, CAPILLARY: Glucose-Capillary: 114 mg/dL — ABNORMAL HIGH (ref 70–99)

## 2023-12-03 LAB — CULTURE, BLOOD (ROUTINE X 2): Special Requests: ADEQUATE

## 2023-12-03 MED ORDER — MORPHINE SULFATE (CONCENTRATE) 10 MG /0.5 ML PO SOLN: 5.0000 mg | ORAL | Status: DC | PRN

## 2023-12-03 MED ORDER — QUETIAPINE FUMARATE 25 MG PO TABS: 25.0000 mg | ORAL_TABLET | Freq: Every day | ORAL | Status: AC

## 2023-12-03 MED ORDER — MORPHINE SULFATE (CONCENTRATE) 10 MG /0.5 ML PO SOLN: 5.0000 mg | ORAL | 0 refills | Status: AC | PRN

## 2023-12-03 MED ORDER — HALOPERIDOL LACTATE 2 MG/ML PO CONC: 2.0000 mg | Freq: Three times a day (TID) | ORAL | Status: DC | PRN

## 2023-12-03 MED ORDER — LINAGLIPTIN 5 MG PO TABS: 5.0000 mg | ORAL_TABLET | Freq: Every day | ORAL | Status: AC

## 2023-12-03 NOTE — Care Management Important Message (Signed)
 Important Message  Patient Details  Name: William Fisher MRN: [id number] Date of Birth: 04/14/1932   Important Message Given:  Yes - Medicare IM (reviewed letter with son Ercel Pepitone telephonically at [phone] to review letter)     Neila Bally 12/03/2023, 2:51 PM

## 2023-12-03 NOTE — Progress Notes (Signed)
 Palliative:  HPI: 89 y.o. male  with past medical history of dementia, type 2 diabetes, paroxysmal atrial fibrillation, hyponatremia, CAD, HLD, orthostatic hypotension, diverticulosis admitted on 11/30/2023 from [place] [facility] with fall found to have SIRS likely due to UTI.    I came to bedside and Mr. William Fisher is getting up with assistance. Son, Debria Fang, at bedside as well. [facility] liaison here to evaluate for return to facility. Family have spoken with hospice and would like to return to [facility] with hospice is [facility] approves return.   Update: I returned to bedside but no family present. I spoke with sitter who reports that he has had a good day but impulsive to get up. Per report he rested better last night - continue Seroquel at bedtime. Adjusted PRN medications to ensure comfort. Reported that he is returning to [facility] with hospice. No family present - recommend hospice to discuss medications to continue or stop with family upon return to facility.   All questions/concerns addressed. Emotional support provided.   Exam: Sleeping comfortably. No distress. Abd flat.  Plan: - DNR/DNI - PRN medications for comfort available - Continue Seroquel at bedtime for sundowning - Returning to [place] with hospice   25 min  Vila Grayer, NP Palliative Medicine Team Pager [phone] (Please see [email or web address] for schedule) Team Phone [phone]

## 2023-12-03 NOTE — Plan of Care (Signed)

## 2023-12-03 NOTE — Discharge Summary (Signed)
 Physician Discharge Summary  William Fisher ZOX:[id number] DOB: Jan 28, 1932 DOA: 11/30/2023  PCP: Barnetta Liberty, MD  Admit date: 11/30/2023 Discharge date: 12/03/2023  Disposition: [facility] with Hospice   Recommendations for Outpatient Follow-up:  SYMPTOM MANAGEMENT PER HOSPICE PROTOCOLS  Discharge Condition: HOSPICE   CODE STATUS: DNR  DIET: FULL COMFORT AS TOLERATED   Brief Hospitalization Summary: Please see all hospital notes, images, labs for full details of the hospitalization. ADMISSION PROVIDER HPI:   89 y.o. male with medical history significant for dementia, type 2 diabetes, isolated paroxysmal atrial fibrillation, and CAD who presented to the ED from [facility] upon being found on the ground by a resident.  He was supposedly on the ground for about 10 minutes or so before he was found and there was no loss of consciousness or reported head trauma.  Nursing staff states that he has had gradual decline over the past several weeks with more restricted food and liquid intake.  No reports of fever, vomiting, diarrhea, urinary symptoms, or cough noted.  Patient at baseline only shakes his head yes or no to certain questions and is otherwise not very responsive.  It is therefore difficult to obtain any further relevant history.   ED Course: Vital signs with temperature 100.5 F and pulse of 110 with soft blood pressure readings.  Lactic acid 4.1 noted and leukocytosis of 14,400 with hemoglobin 9.5 and creatinine 1.94.  Sodium is also 134.  CT studies are unremarkable and urinalysis is also unremarkable.  He has received fluid bolus and has been started on Rocephin empirically.  Blood cultures have been obtained.  Flu and COVID studies are negative.   HOSPITAL COURSE  SIRS with lactic acidosis-improved - Noted to have recent UTI and has completed 3-day course of Rocephin   Mild AKI with mild hyponatremia-improving - Continues to have poor oral intake - Now comfort care measures only    Fall - Fall precautions recommended - full comfort care    Hypokalemia -Repleted    Dementia - Currently on Namenda  and Aricept  - TSH of 4   Type 2 diabetes with hyperglycemia - SSI -A1c 7.7%   Hypertension    Patient is now FULL comfort care only and will discharge 6/4 to [facility] with hospice per patient/family wishes.     Discharge Diagnoses:  Principal Problem:   SIRS (systemic inflammatory response syndrome) (HCC) Active Problems:   PAROXYSMAL ATRIAL FIBRILLATION   Essential hypertension   Dementia [facility])   Discharge Instructions:  Allergies as of 12/03/2023       Reactions   Codeine    Guaifenesin    Zinc Sulfate [zinc]         Medication List     STOP taking these medications    aspirin EC 81 MG tablet   B-12 2500 MCG Tabs   CERTAVITE SENIOR PO   Janumet 50-1000 MG tablet Generic drug: sitaGLIPtin-metformin   Macrobid 100 MG capsule Generic drug: nitrofurantoin (macrocrystal-monohydrate)   RA Mineral Oil liquid Generic drug: mineral oil   Vitamin D3 50 MCG (2000 UT) Tabs       TAKE these medications    acetaminophen  500 MG tablet Commonly known as: TYLENOL  Take 500-1,000 mg by mouth every 6 (six) hours as needed for mild pain (pain score 1-3).   donepezil  5 MG tablet Commonly known as: ARICEPT  Take 5 mg by mouth in the morning.   levothyroxine 50 MCG tablet Commonly known as: SYNTHROID Take 25 mcg by mouth daily before breakfast.  linagliptin 5 MG Tabs tablet Commonly known as: Tradjenta Take 1 tablet (5 mg total) by mouth daily.   melatonin 5 MG Tabs Take 5 mg by mouth at bedtime.   memantine  10 MG tablet Commonly known as: Namenda  Take 1 tablet (10 mg total) by mouth 2 (two) times daily.   morphine CONCENTRATE 10 mg / 0.5 ml concentrated solution Place 0.25 mLs (5 mg total) under the tongue every 4 (four) hours as needed for severe pain (pain score 7-10) or shortness of breath.   multivitamin capsule Take 1  capsule by mouth daily.   QUEtiapine 25 MG tablet Commonly known as: SEROQUEL Take 1 tablet (25 mg total) by mouth at bedtime.   sertraline 50 MG tablet Commonly known as: ZOLOFT Take 50 mg by mouth daily.   SYSTANE OP Apply 1 drop to eye in the morning and at bedtime.        Allergies  Allergen Reactions   Codeine    Guaifenesin    Zinc Sulfate [Zinc]    Allergies as of 12/03/2023       Reactions   Codeine    Guaifenesin    Zinc Sulfate [zinc]         Medication List     STOP taking these medications    aspirin EC 81 MG tablet   B-12 2500 MCG Tabs   CERTAVITE SENIOR PO   Janumet 50-1000 MG tablet Generic drug: sitaGLIPtin-metformin   Macrobid 100 MG capsule Generic drug: nitrofurantoin (macrocrystal-monohydrate)   RA Mineral Oil liquid Generic drug: mineral oil   Vitamin D3 50 MCG (2000 UT) Tabs       TAKE these medications    acetaminophen  500 MG tablet Commonly known as: TYLENOL  Take 500-1,000 mg by mouth every 6 (six) hours as needed for mild pain (pain score 1-3).   donepezil  5 MG tablet Commonly known as: ARICEPT  Take 5 mg by mouth in the morning.   levothyroxine 50 MCG tablet Commonly known as: SYNTHROID Take 25 mcg by mouth daily before breakfast.   linagliptin 5 MG Tabs tablet Commonly known as: Tradjenta Take 1 tablet (5 mg total) by mouth daily.   melatonin 5 MG Tabs Take 5 mg by mouth at bedtime.   memantine  10 MG tablet Commonly known as: Namenda  Take 1 tablet (10 mg total) by mouth 2 (two) times daily.   morphine CONCENTRATE 10 mg / 0.5 ml concentrated solution Place 0.25 mLs (5 mg total) under the tongue every 4 (four) hours as needed for severe pain (pain score 7-10) or shortness of breath.   multivitamin capsule Take 1 capsule by mouth daily.   QUEtiapine 25 MG tablet Commonly known as: SEROQUEL Take 1 tablet (25 mg total) by mouth at bedtime.   sertraline 50 MG tablet Commonly known as: ZOLOFT Take 50 mg by  mouth daily.   SYSTANE OP Apply 1 drop to eye in the morning and at bedtime.        Procedures/Studies: CT CHEST ABDOMEN PELVIS W CONTRAST Result Date: 11/30/2023 CLINICAL DATA:  Sepsis EXAM: CT CHEST, ABDOMEN, AND PELVIS WITH CONTRAST TECHNIQUE: Multidetector CT imaging of the chest, abdomen and pelvis was performed following the standard protocol during bolus administration of intravenous contrast. RADIATION DOSE REDUCTION: This exam was performed according to the departmental dose-optimization program which includes automated exposure control, adjustment of the mA and/or kV according to patient size and/or use of iterative reconstruction technique. CONTRAST:  80mL OMNIPAQUE IOHEXOL 300 MG/ML  SOLN COMPARISON:  CT  abdomen pelvis 12/14/2008 FINDINGS: CT CHEST FINDINGS Cardiovascular: Normal heart size. No significant pericardial effusion. The thoracic aorta is normal in caliber. At least moderate calcified and noncalcified atherosclerotic plaque of the thoracic aorta. Four-vessel coronary artery calcifications. Aortic valve leaflet calcification. Mediastinum/Nodes: No enlarged mediastinal, hilar, or axillary lymph nodes. Thyroid  gland, trachea, and esophagus demonstrate no significant findings. Lungs/Pleura: Slightly limited evaluation due to motion artifact. Bibasilar atelectasis. No focal consolidation. No pulmonary nodule. No pulmonary mass. No pleural effusion. No pneumothorax. Musculoskeletal: No chest wall abnormality. No suspicious lytic or blastic osseous lesions. No acute displaced fracture. CT ABDOMEN PELVIS FINDINGS Hepatobiliary: No focal liver abnormality. Calcified gallstone noted within the gallbladder lumen. No gallbladder wall thickening or pericholecystic fluid. No biliary dilatation. Pancreas: No focal lesion. Normal pancreatic contour. No surrounding inflammatory changes. No main pancreatic ductal dilatation. Spleen: Normal in size without focal abnormality. Adrenals/Urinary Tract: No  adrenal nodule bilaterally. Bilateral kidneys enhance symmetrically. No hydronephrosis. No hydroureter. The urinary bladder is unremarkable. Stomach/Bowel: Stomach is within normal limits. No evidence of bowel wall thickening or dilatation. Colonic diverticulosis. Appendix appears normal. Vascular/Lymphatic: No abdominal aorta or iliac aneurysm. Severe atherosclerotic plaque of the aorta and its branches. No abdominal, pelvic, or inguinal lymphadenopathy. Reproductive: Prostate is unremarkable. Other: No intraperitoneal free fluid. No intraperitoneal free gas. No organized fluid collection. Musculoskeletal: No abdominal wall hernia or abnormality. No suspicious lytic or blastic osseous lesions. No acute displaced fracture. Grade 1 anterolisthesis of L5 on S1 with associated bilateral L5 pars interarticularis defects. IMPRESSION: 1. No acute intrathoracic abnormality. 2. Cholelithiasis with no CT evidence of acute cholecystitis. 3. Colonic diverticulosis with no acute diverticulitis. 4. Aortic Atherosclerosis (ICD10-I70.0) including four-vessel coronary calcification and aortic valve leaflet calcifications-correlate for aortic stenosis. Electronically Signed   By: Morgane  Naveau M.D.   On: 11/30/2023 13:46   DG Chest Port 1 View Result Date: 11/30/2023 CLINICAL DATA:  Fall. EXAM: PORTABLE CHEST 1 VIEW COMPARISON:  02/26/2020 FINDINGS: The lungs are clear without focal pneumonia, edema, pneumothorax or pleural effusion. Calcified granuloma noted left lower lobe is seen on CT 07/06/2020. Cardiopericardial silhouette is at upper limits of normal for size. No acute bony abnormality. Telemetry leads overlie the chest. IMPRESSION: No active disease. Electronically Signed   By: Donnal Fusi M.D.   On: 11/30/2023 11:51   DG Pelvis Portable Result Date: 11/30/2023 CLINICAL DATA:  Unwitnessed fall. EXAM: PORTABLE PELVIS 1-2 VIEWS COMPARISON:  None Available. FINDINGS: Bones are diffusely demineralized. SI joints and  symphysis pubis unremarkable. No evidence for an acute fracture. No evidence for hip dislocation. IMPRESSION: Negative. Electronically Signed   By: Donnal Fusi M.D.   On: 11/30/2023 11:50   CT Head Wo Contrast Result Date: 11/30/2023 EXAM: CT HEAD AND CERVICAL SPINE 11/30/2023 11:32:55 AM TECHNIQUE: CT of the head and cervical spine was performed without the administration of intravenous contrast. Multiplanar reformatted images are provided for review. Automated exposure control, iterative reconstruction, and/or weight based adjustment of the mA/kV was utilized to reduce the radiation dose to as low as reasonably achievable. COMPARISON: Head CT dated 02/26/2020 and cervical spine MRI dated 07/01/2011. CLINICAL HISTORY: Head trauma, minor (Age >= 65y). Trauma; Best possible images; Dementia; refused to lay on back or to keep arms down; Could not follow instructions; BIB [facility] for unwitnessed fall. States pt was found in the corner of anothers' room on the floor. No visible abrasions. Hx of dementia, diabetes (CBG 243 per EMS). Takes ASA daily. States pt was last seen at [facility] @  71. FINDINGS: CT HEAD BRAIN AND VENTRICLES: No acute intracranial hemorrhage. No mass effect or midline shift. No abnormal extra-axial fluid collection. No hydrocephalus. Unchanged chronic encephalomalacia of the anterior right temporal lobe and right insula. Gray-white differentiation is otherwise maintained. Stable background of moderate-to-severe chronic small vessel disease. ORBITS: No acute abnormality. SINUSES AND MASTOIDS: No acute abnormality. SOFT TISSUES AND SKULL: No acute skull fracture. No acute soft tissue abnormality. CT CERVICAL SPINE BONES AND ALIGNMENT: No acute fracture or traumatic malalignment. DEGENERATIVE CHANGES: Interval progression of multilevel disc height loss and degenerative endplate changes with mild spinal canal stenosis at C5-6 and C6-7. SOFT TISSUES: No prevertebral soft tissue swelling.  VASCULATURE: Atherosclerotic calcifications of the carotid bulbs. IMPRESSION: 1. No acute intracranial abnormality. 2. No acute fracture or traumatic malalignment of the cervical spine. 3. Interval progression of multilevel disc height loss and degenerative endplate changes with mild spinal canal stenosis at C5-6 and C6-7. Electronically signed by: Audra Blend MD 11/30/2023 11:42 AM EDT RP Workstation: [id number]   CT Cervical Spine Wo Contrast Result Date: 11/30/2023 EXAM: CT HEAD AND CERVICAL SPINE 11/30/2023 11:32:55 AM TECHNIQUE: CT of the head and cervical spine was performed without the administration of intravenous contrast. Multiplanar reformatted images are provided for review. Automated exposure control, iterative reconstruction, and/or weight based adjustment of the mA/kV was utilized to reduce the radiation dose to as low as reasonably achievable. COMPARISON: Head CT dated 02/26/2020 and cervical spine MRI dated 07/01/2011. CLINICAL HISTORY: Head trauma, minor (Age >= 67y). Trauma; Best possible images; Dementia; refused to lay on back or to keep arms down; Could not follow instructions; BIB [facility] for unwitnessed fall. States pt was found in the corner of anothers' room on the floor. No visible abrasions. Hx of dementia, diabetes (CBG 243 per EMS). Takes ASA daily. States pt was last seen at [facility] @ 0945. FINDINGS: CT HEAD BRAIN AND VENTRICLES: No acute intracranial hemorrhage. No mass effect or midline shift. No abnormal extra-axial fluid collection. No hydrocephalus. Unchanged chronic encephalomalacia of the anterior right temporal lobe and right insula. Gray-white differentiation is otherwise maintained. Stable background of moderate-to-severe chronic small vessel disease. ORBITS: No acute abnormality. SINUSES AND MASTOIDS: No acute abnormality. SOFT TISSUES AND SKULL: No acute skull fracture. No acute soft tissue abnormality. CT CERVICAL SPINE BONES AND ALIGNMENT: No acute fracture or  traumatic malalignment. DEGENERATIVE CHANGES: Interval progression of multilevel disc height loss and degenerative endplate changes with mild spinal canal stenosis at C5-6 and C6-7. SOFT TISSUES: No prevertebral soft tissue swelling. VASCULATURE: Atherosclerotic calcifications of the carotid bulbs. IMPRESSION: 1. No acute intracranial abnormality. 2. No acute fracture or traumatic malalignment of the cervical spine. 3. Interval progression of multilevel disc height loss and degenerative endplate changes with mild spinal canal stenosis at C5-6 and C6-7. Electronically signed by: Audra Blend MD 11/30/2023 11:42 AM EDT RP Workstation: [id number]     Subjective: Pt comfortable, NAD.    Discharge Exam: Vitals:   12/02/23 0219 12/02/23 0623  BP: 131/79 120/70  Pulse: 96 92  Resp:  20  Temp: (!) 97.5 F (36.4 C) (!) 97.5 F (36.4 C)  SpO2: 97% 100%   Vitals:   12/01/23 1818 12/01/23 2139 12/02/23 0219 12/02/23 0623  BP: 136/89 111/72 131/79 120/70  Pulse: (!) 102 (!) 108 96 92  Resp: 19 20  20   Temp: 98.4 F (36.9 C) 98.1 F (36.7 C) (!) 97.5 F (36.4 C) (!) 97.5 F (36.4 C)  TempSrc: Oral Oral Oral Oral  SpO2: 99%  99% 97% 100%  Weight:      Height:       General: Pt is alert, awake, not in acute distress Cardiovascular: normal S1/S2 +, no rubs, no gallops Respiratory: CTA bilaterally, no wheezing, no rhonchi Abdominal: Soft, NT, ND, bowel sounds + Extremities: no edema, no cyanosis   The results of significant diagnostics from this hospitalization (including imaging, microbiology, ancillary and laboratory) are listed below for reference.     Microbiology: Recent Results (from the past 240 hours)  Resp panel by RT-PCR (RSV, Flu A&B, Covid) Anterior Nasal Swab     Status: None   Collection Time: 11/30/23 10:48 AM   Specimen: Anterior Nasal Swab  Result Value Ref Range Status   SARS Coronavirus 2 by RT PCR NEGATIVE NEGATIVE Final    Comment: (NOTE) SARS-CoV-2 target nucleic  acids are NOT DETECTED.  The SARS-CoV-2 RNA is generally detectable in upper respiratory specimens during the acute phase of infection. The lowest concentration of SARS-CoV-2 viral copies this assay can detect is 138 copies/mL. A negative result does not preclude SARS-Cov-2 infection and should not be used as the sole basis for treatment or other patient management decisions. A negative result may occur with  improper specimen collection/handling, submission of specimen other than nasopharyngeal swab, presence of viral mutation(s) within the areas targeted by this assay, and inadequate number of viral copies(<138 copies/mL). A negative result must be combined with clinical observations, patient history, and epidemiological information. The expected result is Negative.  Fact Sheet for Patients:  BloggerCourse.com  Fact Sheet for Healthcare Providers:  SeriousBroker.it  This test is no t yet approved or cleared by the [place]  FDA and  has been authorized for detection and/or diagnosis of SARS-CoV-2 by FDA under an Emergency Use Authorization (EUA). This EUA will remain  in effect (meaning this test can be used) for the duration of the COVID-19 declaration under Section 564(b)(1) of the Act, 21 U.S.C.section 360bbb-3(b)(1), unless the authorization is terminated  or revoked sooner.       Influenza A by PCR NEGATIVE NEGATIVE Final   Influenza B by PCR NEGATIVE NEGATIVE Final    Comment: (NOTE) The Xpert Xpress SARS-CoV-2/FLU/RSV plus assay is intended as an aid in the diagnosis of influenza from Nasopharyngeal swab specimens and should not be used as a sole basis for treatment. Nasal washings and aspirates are unacceptable for Xpert Xpress SARS-CoV-2/FLU/RSV testing.  Fact Sheet for Patients: BloggerCourse.com  Fact Sheet for Healthcare Providers: SeriousBroker.it  This  test is not yet approved or cleared by the [place]  FDA and has been authorized for detection and/or diagnosis of SARS-CoV-2 by FDA under an Emergency Use Authorization (EUA). This EUA will remain in effect (meaning this test can be used) for the duration of the COVID-19 declaration under Section 564(b)(1) of the Act, 21 U.S.C. section 360bbb-3(b)(1), unless the authorization is terminated or revoked.     Resp Syncytial Virus by PCR NEGATIVE NEGATIVE Final    Comment: (NOTE) Fact Sheet for Patients: BloggerCourse.com  Fact Sheet for Healthcare Providers: SeriousBroker.it  This test is not yet approved or cleared by the [place]  FDA and has been authorized for detection and/or diagnosis of SARS-CoV-2 by FDA under an Emergency Use Authorization (EUA). This EUA will remain in effect (meaning this test can be used) for the duration of the COVID-19 declaration under Section 564(b)(1) of the Act, 21 U.S.C. section 360bbb-3(b)(1), unless the authorization is terminated or revoked.  Performed at [facility], [address],  [place], [place] [address]   Blood Culture (routine x 2)     Status: Abnormal   Collection Time: 11/30/23 11:53 AM   Specimen: BLOOD  Result Value Ref Range Status   Specimen Description   Final    BLOOD BLOOD LEFT ARM Performed at [facility], [address], [place], [place] [address]    Special Requests   Final    BOTTLES DRAWN AEROBIC AND ANAEROBIC Blood Culture adequate volume Performed at [facility], [address] [place], [place] [address]    Culture  Setup Time   Final    AEROBIC BOTTLE ONLY GRAM POSITIVE COCCI Gram Stain Report Called to,Read Back By and Verified With: A SHELTON AT 0951 12/01/23 BY A WILSON CRITICAL RESULT CALLED TO, READ BACK BY AND VERIFIED WITH: PHARMD STEVEN HURTH ON 12/01/23 @ 1343 BY DRT Performed at [facility], [address] [place], [place] [address]     Culture STREPTOCOCCUS MUTANS (A)  Final   Report Status 12/03/2023 FINAL  Final   Organism ID, Bacteria STREPTOCOCCUS MUTANS  Final      Susceptibility   Streptococcus mutans - MIC*    PENICILLIN <=0.06 SENSITIVE Sensitive     CEFTRIAXONE <=0.12 SENSITIVE Sensitive     ERYTHROMYCIN <=0.12 SENSITIVE Sensitive     LEVOFLOXACIN 1 SENSITIVE Sensitive     VANCOMYCIN 1 SENSITIVE Sensitive     * STREPTOCOCCUS MUTANS  Blood Culture ID Panel (Reflexed)     Status: Abnormal   Collection Time: 11/30/23 11:53 AM  Result Value Ref Range Status   Enterococcus faecalis NOT DETECTED NOT DETECTED Final   Enterococcus Faecium NOT DETECTED NOT DETECTED Final   Listeria monocytogenes NOT DETECTED NOT DETECTED Final   Staphylococcus species NOT DETECTED NOT DETECTED Final   Staphylococcus aureus (BCID) NOT DETECTED NOT DETECTED Final   Staphylococcus epidermidis NOT DETECTED NOT DETECTED Final   Staphylococcus lugdunensis NOT DETECTED NOT DETECTED Final   Streptococcus species DETECTED (A) NOT DETECTED Final    Comment: Not Enterococcus species, Streptococcus agalactiae, Streptococcus pyogenes, or Streptococcus pneumoniae. CRITICAL RESULT CALLED TO, READ BACK BY AND VERIFIED WITH: PHARMD STEVEN HURTH ON 12/01/23 @ 1343 BY DRT    Streptococcus agalactiae NOT DETECTED NOT DETECTED Final   Streptococcus pneumoniae NOT DETECTED NOT DETECTED Final   Streptococcus pyogenes NOT DETECTED NOT DETECTED Final   A.calcoaceticus-baumannii NOT DETECTED NOT DETECTED Final   Bacteroides fragilis NOT DETECTED NOT DETECTED Final   Enterobacterales NOT DETECTED NOT DETECTED Final   Enterobacter cloacae complex NOT DETECTED NOT DETECTED Final   Escherichia coli NOT DETECTED NOT DETECTED Final   Klebsiella aerogenes NOT DETECTED NOT DETECTED Final   Klebsiella oxytoca NOT DETECTED NOT DETECTED Final   Klebsiella pneumoniae NOT DETECTED NOT DETECTED Final   Proteus species NOT DETECTED NOT DETECTED Final   Salmonella  species NOT DETECTED NOT DETECTED Final   Serratia marcescens NOT DETECTED NOT DETECTED Final   Haemophilus influenzae NOT DETECTED NOT DETECTED Final   Neisseria meningitidis NOT DETECTED NOT DETECTED Final   Pseudomonas aeruginosa NOT DETECTED NOT DETECTED Final   Stenotrophomonas maltophilia NOT DETECTED NOT DETECTED Final   Candida albicans NOT DETECTED NOT DETECTED Final   Candida auris NOT DETECTED NOT DETECTED Final   Candida glabrata NOT DETECTED NOT DETECTED Final   Candida krusei NOT DETECTED NOT DETECTED Final   Candida parapsilosis NOT DETECTED NOT DETECTED Final   Candida tropicalis NOT DETECTED NOT DETECTED Final   Cryptococcus neoformans/gattii NOT DETECTED NOT DETECTED Final  Comment: Performed at [facility], [address], [place], [place] [address]  Blood Culture (routine x 2)     Status: None (Preliminary result)   Collection Time: 11/30/23  2:25 PM   Specimen: BLOOD  Result Value Ref Range Status   Specimen Description BLOOD BLOOD RIGHT ARM  Final   Special Requests   Final    BOTTLES DRAWN AEROBIC AND ANAEROBIC Blood Culture adequate volume   Culture   Final    NO GROWTH 3 DAYS Performed at [facility], [address], [place], [place] [address]    Report Status PENDING  Incomplete  MRSA Next Gen by PCR, Nasal     Status: None   Collection Time: 11/30/23  3:45 PM   Specimen: Nasal Mucosa; Nasal Swab  Result Value Ref Range Status   MRSA by PCR Next Gen NOT DETECTED NOT DETECTED Final    Comment: (NOTE) The GeneXpert MRSA Assay (FDA approved for NASAL specimens only), is one component of a comprehensive MRSA colonization surveillance program. It is not intended to diagnose MRSA infection nor to guide or monitor treatment for MRSA infections. Test performance is not FDA approved in patients less than 79 years old. Performed at [facility], [address], [place], [place] [address]      Labs: BNP (last 3 results) No results for input(s): "BNP"  in the last 8760 hours. Basic Metabolic Panel: Recent Labs  Lab 11/30/23 1153 12/01/23 0340 12/02/23 0519  NA 134* 137 132*  K 4.4 4.1 3.3*  CL 100 107 103  CO2 20* 23 22  GLUCOSE 179* 119* 85  BUN 36* 28* 21  CREATININE 1.94* 1.51* 1.34*  CALCIUM 9.3 8.5* 8.3*  MG  --  1.6* 1.8   Liver Function Tests: Recent Labs  Lab 11/30/23 1153  AST 38  ALT 27  ALKPHOS 71  BILITOT 0.4  PROT 7.0  ALBUMIN 2.6*   Recent Labs  Lab 11/30/23 1153  LIPASE 37   No results for input(s): "AMMONIA" in the last 168 hours. CBC: Recent Labs  Lab 11/30/23 1153 12/01/23 0340 12/02/23 0519  WBC 14.4* 9.1 8.8  NEUTROABS 12.1*  --   --   HGB 9.5* 8.5* 8.9*  HCT 29.1* 26.5* 27.4*  MCV 89.8 90.4 90.4  PLT 228 160 212   Cardiac Enzymes: Recent Labs  Lab 11/30/23 1518  CKTOTAL 112   BNP: Invalid input(s): "POCBNP" CBG: Recent Labs  Lab 12/01/23 2142 12/02/23 0730 12/02/23 1113 12/02/23 1729 12/03/23 0828  GLUCAP 103* 86 108* 86 114*   D-Dimer No results for input(s): "DDIMER" in the last 72 hours. Hgb A1c Recent Labs    11/30/23 1547  HGBA1C 7.7*   Lipid Profile No results for input(s): "CHOL", "HDL", "LDLCALC", "TRIG", "CHOLHDL", "LDLDIRECT" in the last 72 hours. Thyroid  function studies Recent Labs    11/30/23 1519  TSH 4.099   Anemia work up No results for input(s): "VITAMINB12", "FOLATE", "FERRITIN", "TIBC", "IRON", "RETICCTPCT" in the last 72 hours. Urinalysis    Component Value Date/Time   COLORURINE YELLOW 11/30/2023 1048   APPEARANCEUR HAZY (A) 11/30/2023 1048   LABSPEC 1.019 11/30/2023 1048   PHURINE 5.0 11/30/2023 1048   GLUCOSEU NEGATIVE 11/30/2023 1048   HGBUR NEGATIVE 11/30/2023 1048   BILIRUBINUR NEGATIVE 11/30/2023 1048   KETONESUR NEGATIVE 11/30/2023 1048   PROTEINUR 30 (A) 11/30/2023 1048   UROBILINOGEN 0.2 02/27/2010 1354   NITRITE NEGATIVE 11/30/2023 1048   LEUKOCYTESUR NEGATIVE 11/30/2023 1048   Sepsis Labs  Recent Labs  Lab  11/30/23 1153 12/01/23 0340 12/02/23 0519  WBC 14.4* 9.1 8.8   Microbiology Recent Results (from the past 240 hours)  Resp panel by RT-PCR (RSV, Flu A&B, Covid) Anterior Nasal Swab     Status: None   Collection Time: 11/30/23 10:48 AM   Specimen: Anterior Nasal Swab  Result Value Ref Range Status   SARS Coronavirus 2 by RT PCR NEGATIVE NEGATIVE Final    Comment: (NOTE) SARS-CoV-2 target nucleic acids are NOT DETECTED.  The SARS-CoV-2 RNA is generally detectable in upper respiratory specimens during the acute phase of infection. The lowest concentration of SARS-CoV-2 viral copies this assay can detect is 138 copies/mL. A negative result does not preclude SARS-Cov-2 infection and should not be used as the sole basis for treatment or other patient management decisions. A negative result may occur with  improper specimen collection/handling, submission of specimen other than nasopharyngeal swab, presence of viral mutation(s) within the areas targeted by this assay, and inadequate number of viral copies(<138 copies/mL). A negative result must be combined with clinical observations, patient history, and epidemiological information. The expected result is Negative.  Fact Sheet for Patients:  BloggerCourse.com  Fact Sheet for Healthcare Providers:  SeriousBroker.it  This test is no t yet approved or cleared by the [place]  FDA and  has been authorized for detection and/or diagnosis of SARS-CoV-2 by FDA under an Emergency Use Authorization (EUA). This EUA will remain  in effect (meaning this test can be used) for the duration of the COVID-19 declaration under Section 564(b)(1) of the Act, 21 U.S.C.section 360bbb-3(b)(1), unless the authorization is terminated  or revoked sooner.       Influenza A by PCR NEGATIVE NEGATIVE Final   Influenza B by PCR NEGATIVE NEGATIVE Final    Comment: (NOTE) The Xpert Xpress SARS-CoV-2/FLU/RSV  plus assay is intended as an aid in the diagnosis of influenza from Nasopharyngeal swab specimens and should not be used as a sole basis for treatment. Nasal washings and aspirates are unacceptable for Xpert Xpress SARS-CoV-2/FLU/RSV testing.  Fact Sheet for Patients: BloggerCourse.com  Fact Sheet for Healthcare Providers: SeriousBroker.it  This test is not yet approved or cleared by the [place]  FDA and has been authorized for detection and/or diagnosis of SARS-CoV-2 by FDA under an Emergency Use Authorization (EUA). This EUA will remain in effect (meaning this test can be used) for the duration of the COVID-19 declaration under Section 564(b)(1) of the Act, 21 U.S.C. section 360bbb-3(b)(1), unless the authorization is terminated or revoked.     Resp Syncytial Virus by PCR NEGATIVE NEGATIVE Final    Comment: (NOTE) Fact Sheet for Patients: BloggerCourse.com  Fact Sheet for Healthcare Providers: SeriousBroker.it  This test is not yet approved or cleared by the [place]  FDA and has been authorized for detection and/or diagnosis of SARS-CoV-2 by FDA under an Emergency Use Authorization (EUA). This EUA will remain in effect (meaning this test can be used) for the duration of the COVID-19 declaration under Section 564(b)(1) of the Act, 21 U.S.C. section 360bbb-3(b)(1), unless the authorization is terminated or revoked.  Performed at [facility], [address], [place], [place] [address]   Blood Culture (routine x 2)     Status: Abnormal   Collection Time: 11/30/23 11:53 AM   Specimen: BLOOD  Result Value Ref Range Status   Specimen Description   Final    BLOOD BLOOD LEFT ARM Performed at [facility], [address], [place], [place] [address]  Special Requests   Final    BOTTLES DRAWN AEROBIC AND ANAEROBIC Blood Culture adequate volume Performed at [facility], [address], [place], [place] [address]    Culture  Setup Time   Final    AEROBIC BOTTLE ONLY GRAM POSITIVE COCCI Gram Stain Report Called to,Read Back By and Verified With: A SHELTON AT 0951 12/01/23 BY A WILSON CRITICAL RESULT CALLED TO, READ BACK BY AND VERIFIED WITH: PHARMD STEVEN HURTH ON 12/01/23 @ 1343 BY DRT Performed at [facility], [address] [place], [place] [address]    Culture STREPTOCOCCUS MUTANS (A)  Final   Report Status 12/03/2023 FINAL  Final   Organism ID, Bacteria STREPTOCOCCUS MUTANS  Final      Susceptibility   Streptococcus mutans - MIC*    PENICILLIN <=0.06 SENSITIVE Sensitive     CEFTRIAXONE <=0.12 SENSITIVE Sensitive     ERYTHROMYCIN <=0.12 SENSITIVE Sensitive     LEVOFLOXACIN 1 SENSITIVE Sensitive     VANCOMYCIN 1 SENSITIVE Sensitive     * STREPTOCOCCUS MUTANS  Blood Culture ID Panel (Reflexed)     Status: Abnormal   Collection Time: 11/30/23 11:53 AM  Result Value Ref Range Status   Enterococcus faecalis NOT DETECTED NOT DETECTED Final   Enterococcus Faecium NOT DETECTED NOT DETECTED Final   Listeria monocytogenes NOT DETECTED NOT DETECTED Final   Staphylococcus species NOT DETECTED NOT DETECTED Final   Staphylococcus aureus (BCID) NOT DETECTED NOT DETECTED Final   Staphylococcus epidermidis NOT DETECTED NOT DETECTED Final   Staphylococcus lugdunensis NOT DETECTED NOT DETECTED Final   Streptococcus species DETECTED (A) NOT DETECTED Final    Comment: Not Enterococcus species, Streptococcus agalactiae, Streptococcus pyogenes, or Streptococcus pneumoniae. CRITICAL RESULT CALLED TO, READ BACK BY AND VERIFIED WITH: PHARMD STEVEN HURTH ON 12/01/23 @ 1343 BY DRT    Streptococcus agalactiae NOT DETECTED NOT DETECTED Final   Streptococcus pneumoniae NOT DETECTED NOT DETECTED Final   Streptococcus pyogenes NOT DETECTED NOT DETECTED Final   A.calcoaceticus-baumannii NOT DETECTED NOT DETECTED Final   Bacteroides fragilis NOT DETECTED NOT DETECTED  Final   Enterobacterales NOT DETECTED NOT DETECTED Final   Enterobacter cloacae complex NOT DETECTED NOT DETECTED Final   Escherichia coli NOT DETECTED NOT DETECTED Final   Klebsiella aerogenes NOT DETECTED NOT DETECTED Final   Klebsiella oxytoca NOT DETECTED NOT DETECTED Final   Klebsiella pneumoniae NOT DETECTED NOT DETECTED Final   Proteus species NOT DETECTED NOT DETECTED Final   Salmonella species NOT DETECTED NOT DETECTED Final   Serratia marcescens NOT DETECTED NOT DETECTED Final   Haemophilus influenzae NOT DETECTED NOT DETECTED Final   Neisseria meningitidis NOT DETECTED NOT DETECTED Final   Pseudomonas aeruginosa NOT DETECTED NOT DETECTED Final   Stenotrophomonas maltophilia NOT DETECTED NOT DETECTED Final   Candida albicans NOT DETECTED NOT DETECTED Final   Candida auris NOT DETECTED NOT DETECTED Final   Candida glabrata NOT DETECTED NOT DETECTED Final   Candida krusei NOT DETECTED NOT DETECTED Final   Candida parapsilosis NOT DETECTED NOT DETECTED Final   Candida tropicalis NOT DETECTED NOT DETECTED Final   Cryptococcus neoformans/gattii NOT DETECTED NOT DETECTED Final    Comment: Performed at [facility], [address] [place], [place] [address]  Blood Culture (routine x 2)     Status: None (Preliminary result)   Collection Time: 11/30/23  2:25 PM   Specimen: BLOOD  Result Value Ref Range Status   Specimen Description BLOOD BLOOD RIGHT ARM  Final  Special Requests   Final    BOTTLES DRAWN AEROBIC AND ANAEROBIC Blood Culture adequate volume   Culture   Final    NO GROWTH 3 DAYS Performed at [facility], [address] [place], [place] [address]    Report Status PENDING  Incomplete  MRSA Next Gen by PCR, Nasal     Status: None   Collection Time: 11/30/23  3:45 PM   Specimen: Nasal Mucosa; Nasal Swab  Result Value Ref Range Status   MRSA by PCR Next Gen NOT DETECTED NOT DETECTED Final    Comment: (NOTE) The GeneXpert MRSA Assay (FDA approved for NASAL  specimens only), is one component of a comprehensive MRSA colonization surveillance program. It is not intended to diagnose MRSA infection nor to guide or monitor treatment for MRSA infections. Test performance is not FDA approved in patients less than 11 years old. Performed at [facility], [address], [place], [place] [address]    Time coordinating discharge: 40 mins  SIGNED:  Faustino Hook, MD  [facility] 12/03/2023, 2:23 PM How to contact the [facility] Attending or Consulting provider 7A - 7P or covering provider during after hours 7P -7A, for this patient?  Check the care team in [facility] and look for a) attending/consulting TRH provider listed and b) the TRH team listed Log into [email or web address] and use [place]'s universal password to access. If you do not have the password, please contact the hospital operator. Locate the TRH provider you are looking for under [facility] and page to a number that you can be directly reached. If you still have difficulty reaching the provider, please page the [facility] (Director on Call) for the Hospitalists listed on amion for assistance.

## 2023-12-03 NOTE — TOC Progression Note (Signed)
 Transition of Care [facility]) - Progression Note    Patient Details  Name: William Fisher MRN: [id number] Date of Birth: [birth year]  Transition of Care [facility]) CM/SW Contact  Cyndie Dredge, [place] Phone Number: 12/03/2023, 4:08 PM  Clinical Narrative:     Writer has been speaking with [facility] Dolores on and off today regarding assessment that was done at 10AM. [facility] Dolores confirmed that they were able to accept patient back. Spoke with son and updated him. Son stated to hold off on EMS so he can call [facility] since he has not heard back from them. TOC to follow.  Expected Discharge Plan:  (to be determined)- [facility]- with hospice  Barriers to Discharge: Continued Medical Work up  Expected Discharge Plan and Services In-house Referral: Clinical Social Work     Living arrangements for the past 2 months: Assisted Living Facility Expected Discharge Date: 12/03/23                                     Social Determinants of Health (SDOH) Interventions SDOH Screenings   Food Insecurity: No Food Insecurity (11/30/2023)  Housing: Low Risk  (11/30/2023)  Transportation Needs: No Transportation Needs (11/30/2023)  Utilities: Not At Risk (11/30/2023)  Social Connections: Unknown (11/30/2023)  Tobacco Use: Low Risk  (11/30/2023)    Readmission Risk Interventions     No data to display

## 2023-12-03 NOTE — Progress Notes (Signed)
 Patient resting quietly safety observations in place

## 2023-12-03 NOTE — Progress Notes (Signed)
 Pt has had good day. Required no medications for pain or for anxiety/acting out. Pt did refuse all oral meds today but ate well for lunch and took a few bites of supper. Pt was up OOB to [facility] with assistance today for BM. Remains confused but very pleasant and easily directable. Report has been called to [employer], EMS transport has been arranged.

## 2023-12-03 NOTE — NC FL2 (Signed)
 [place]  MEDICAID FL2 LEVEL OF CARE FORM     IDENTIFICATION  Patient Name: William Fisher Birthdate: [birth year] Sex: male Admission Date (Current Location): 11/30/2023  [facility] and [place] Number:  [employer] and Address:  [facility],  [address] [name] [address]      Provider Number: [id number]  Attending Physician Name and Address:  Rayfield Cairo, MD  Relative Name and Phone Number:       Current Level of Care: Hospital Recommended Level of Care: Assisted Living Facility [facility]) Prior Approval Number:    Date Approved/Denied:   PASRR Number: [id number]  Discharge Plan: Other (Comment) (ALF- Memory Care)    Current Diagnoses: Patient Active Problem List   Diagnosis Date Noted   SIRS (systemic inflammatory response syndrome) (HCC) 11/30/2023   Dementia (HCC) 11/22/2020   HOH (hard of hearing) 11/22/2020   Acute metabolic encephalopathy 02/26/2020   Pneumonia due to COVID-19 virus 02/26/2020   Essential hypertension 02/26/2020   Memory change 07/13/2014   DIVERTICULITIS-COLON 03/01/2009   HYPERTENSION 09/14/2007   Type II or unspecified type diabetes mellitus without mention of complication, not stated as uncontrolled 09/11/2007   PAROXYSMAL ATRIAL FIBRILLATION 09/11/2007   PULMONARY NODULE 08/17/2007    Orientation RESPIRATION BLADDER Height & Weight     Self  Normal Incontinent Weight: 154 lb 12.2 oz (70.2 kg) Height:  5\' 10"  (177.8 cm)  BEHAVIORAL SYMPTOMS/MOOD NEUROLOGICAL BOWEL NUTRITION STATUS      Incontinent Diet (Heart healthy/carb modified)  AMBULATORY STATUS COMMUNICATION OF NEEDS Skin   Extensive Assist Verbally Skin abrasions                       Personal Care Assistance Level of Assistance  Bathing, Feeding, Dressing Bathing Assistance: Maximum assistance Feeding assistance: Limited assistance Dressing Assistance: Maximum assistance     Functional Limitations Info  Sight, Hearing, Speech  Sight Info: Adequate Hearing Info: Adequate Speech Info: Adequate    SPECIAL CARE FACTORS FREQUENCY                       Contractures Contractures Info: Not present    Additional Factors Info  Code Status, Allergies Code Status Info: DNR-Limited Allergies Info: Codeine Guafenesin, Zinc Sulfate Psychotropic Info: Zoloft         Current Medications (12/03/2023):  This is the current hospital active medication list Current Facility-Administered Medications  Medication Dose Route Frequency Provider Last Rate Last Admin   acetaminophen  (TYLENOL ) tablet 650 mg  650 mg Oral Q6H PRN Shah, Pratik D, DO   650 mg at 12/01/23 2229   Or   acetaminophen  (TYLENOL ) suppository 650 mg  650 mg Rectal Q6H PRN Mason Sole, Pratik D, DO       antiseptic oral rinse (BIOTENE) solution 15 mL  15 mL Topical PRN Parker, Alicia C, NP       artificial tears ophthalmic solution 1 drop  1 drop Both Eyes QID PRN Parker, Alicia C, NP       Chlorhexidine Gluconate Cloth 2 % PADS 6 each  6 each Topical Q0600 Doreene Gammon D, DO   6 each at 12/03/23 5621   donepezil  (ARICEPT ) tablet 5 mg  5 mg Oral q AM Shah, Pratik D, DO   5 mg at 12/02/23 0625   fentaNYL (SUBLIMAZE) injection 12.5 mcg  12.5 mcg Intravenous Q2H PRN Parker, Alicia C, NP       glycopyrrolate (ROBINUL) tablet 1 mg  1 mg Oral Q4H PRN Parker, Alicia C, NP       Or   glycopyrrolate (ROBINUL) injection 0.2 mg  0.2 mg Subcutaneous Q4H PRN Parker, Alicia C, NP       Or   glycopyrrolate (ROBINUL) injection 0.2 mg  0.2 mg Intravenous Q4H PRN Parker, Alicia C, NP       haloperidol  lactate (HALDOL ) injection 2 mg  2 mg Intravenous Q6H PRN Parker, Alicia C, NP       levothyroxine (SYNTHROID) tablet 50 mcg  50 mcg Oral QAC breakfast Shah, Pratik D, DO   50 mcg at 12/02/23 2130   memantine  (NAMENDA ) tablet 10 mg  10 mg Oral BID Shah, Pratik D, DO   10 mg at 12/02/23 2108   ondansetron  (ZOFRAN ) tablet 4 mg  4 mg Oral Q6H PRN Shah, Pratik D, DO       Or    ondansetron  (ZOFRAN ) injection 4 mg  4 mg Intravenous Q6H PRN Mason Sole, Pratik D, DO       QUEtiapine (SEROQUEL) tablet 25 mg  25 mg Oral QHS Parker, Alicia C, NP   25 mg at 12/02/23 2108   sertraline (ZOLOFT) tablet 50 mg  50 mg Oral Daily Mason Sole, Pratik D, DO   50 mg at 12/02/23 0911     Discharge Medications: Please see discharge summary for a list of discharge medications.  Relevant Imaging Results:  Relevant Lab Results:   Additional Information Pt SSN:[id number]  Cyndie Dredge, LCSWA

## 2023-12-03 NOTE — Progress Notes (Signed)
   12/03/23 1722  TOC Discharge Assessment  Final next level of care Memory [facility])  Once discharged, how will the patient get to their discharge location? Ambulance  Ambulance company providing transportation Other (Comment) [facility])  Has discharge transport plan been identified? Yes  Barriers to Discharge Barriers Resolved  Patient states their goals for this hospitalization and ongoing recovery are: n/a  [facility] Agency Other - See comment [facility] [employer]  Date [facility] Agency Contacted 12/03/23   Pt to dc to [facility]. Pt's son has been updated. [employer] has been notified. Nurse has called report. EMS has been notified of transport. TOC signing off.

## 2023-12-03 NOTE — Progress Notes (Signed)
   12/03/23 1625  Medical Necessity for Transport Certificate --- IF THIS TRANSPORT IS ROUND TRIP OR SCHEDULED AND REPEATED, A PHYSICIAN MUST COMPLETE THIS FORM  Transport from: (Location) [facility]  Transport to (Location) Other (Comment) [facility])  Did the patient arrive from a Skilled Nursing Facility, Assisted Living Facility or Group Home? Yes  Care Facility Name Other (enter name of facility below)  Care Facility Name [facility]  Is this the closest appropriate facility? Yes  Date of Transport Service 12/03/23  Name of Transporting Agency [facility]  Round Trip Transport? No  Reason for Transport Discharge  Is this a hospice patient? Yes  Is this transport related to the patient's terminal illness? No  Describe the Medical Condition SIRS  Q1 Are ALL the following "true"? 1. Patient unable to get up from bed without assistance  AND  2. Unable to ambulate  AND  3. Unable to sit in a chair, including wheelchair. Yes  Q2 Could the patient be transported safely by other means of transportation (I.E., wheelchair van)? No  Electronic Signature Robinette Chou, RNCM  Credentials CM  Date Signed 12/03/23  Print Form Print

## 2023-12-03 NOTE — Progress Notes (Signed)
 Mobility Specialist Progress Note:    12/03/23 1035  Mobility  Activity Transferred to/from BSC;Stood at bedside  Level of Assistance Moderate assist, patient does 50-74%  Assistive Device BSC  Distance Ambulated (ft) 5 ft  Range of Motion/Exercises Active;All extremities  Activity Response Tolerated well  Mobility Referral No  Mobility visit 1 Mobility  Mobility Specialist Start Time (ACUTE ONLY) 1020  Mobility Specialist Stop Time (ACUTE ONLY) 1035  Mobility Specialist Time Calculation (min) (ACUTE ONLY) 15 min   Pt received in bed, staff requesting assistance to transfer. Required ModA to stand and transfer with hand-held assist and BSC. Tolerated well, asx throughout. Returned supine, staff and family in room. All needs met.   Glinda Lapping Mobility Specialist Please contact via Special educational needs teacher or  Rehab office at [phone]

## 2023-12-03 NOTE — [place] FL2 (Signed)
 William Fisher  MEDICAID FL2 LEVEL OF CARE FORM     IDENTIFICATION  Patient Name: William Fisher Birthdate: 18-Mar-1932 Sex: male Admission Date (Current Location): 11/30/2023  William Fisher  Relative Name and Phone Number:       Current Level of Care: Hospital Recommended Level of Care: Assisted Living Facility [facility] Care) Prior Approval Number:    Date Approved/Denied:   PASRR Number: [id number]  Discharge Plan: Other (Comment) (ALF- Memory Care)    Current Diagnoses: Patient Active Problem List   Diagnosis Date Noted   SIRS (systemic inflammatory response syndrome) (HCC) 11/30/2023   Dementia (HCC) 11/22/2020   HOH (hard of hearing) 11/22/2020   Acute metabolic encephalopathy 02/26/2020   Pneumonia due to COVID-19 virus 02/26/2020   Essential hypertension 02/26/2020   Memory change 07/13/2014   DIVERTICULITIS-COLON 03/01/2009   HYPERTENSION 09/14/2007   Type II or unspecified type diabetes mellitus without mention of complication, not stated as uncontrolled 09/11/2007   PAROXYSMAL ATRIAL FIBRILLATION 09/11/2007   PULMONARY NODULE 08/17/2007    Orientation RESPIRATION BLADDER Height & Weight     Self  Normal Incontinent Weight: 154 lb 12.2 oz (70.2 kg) Height:  5\' 10"  (177.8 cm)  BEHAVIORAL SYMPTOMS/MOOD NEUROLOGICAL BOWEL NUTRITION STATUS      Incontinent Diet (Heart healthy/carb modified)  AMBULATORY STATUS COMMUNICATION OF NEEDS Skin   Extensive Assist Verbally Skin abrasions                       Personal Care Assistance Level of Assistance  Bathing, Feeding, Dressing Bathing Assistance: Maximum assistance Feeding assistance: Limited assistance Dressing Assistance: Maximum assistance     Functional Limitations Info  Sight, Hearing, Speech  Sight Info: Adequate Hearing Info: Adequate Speech Info: Adequate    SPECIAL CARE FACTORS FREQUENCY                       Contractures Contractures Info: Not present    Additional Factors Info  Code Status, Allergies Code Status Info: DNR-Limited Allergies Info: Codeine Guafenesin, Zinc Sulfate Psychotropic Info: Zoloft         Current Medications (12/03/2023):  This is the current hospital active medication list Current Facility-Administered Medications  Medication Dose Route Frequency Provider Last Rate Last Admin   acetaminophen  (TYLENOL ) tablet 650 mg  650 mg Oral Q6H PRN Shah, Pratik D, DO   650 mg at 12/01/23 2229   Or   acetaminophen  (TYLENOL ) suppository 650 mg  650 mg Rectal Q6H PRN Mason Sole, Pratik D, DO       antiseptic oral rinse (BIOTENE) solution 15 mL  15 mL Topical PRN Parker, Alicia C, NP       artificial tears ophthalmic solution 1 drop  1 drop Both Eyes QID PRN Parker, Alicia C, NP       Chlorhexidine Gluconate Cloth 2 % PADS 6 each  6 each Topical Q0600 Doreene Gammon D, DO   6 each at 12/03/23 7846   donepezil  (ARICEPT ) tablet 5 mg  5 mg Oral q AM Shah, Pratik D, DO   5 mg at 12/02/23 0625   glycopyrrolate (ROBINUL) tablet 1 mg  1 mg Oral Q4H PRN Parker, Alicia C, NP       Or   glycopyrrolate (ROBINUL)  injection 0.2 mg  0.2 mg Subcutaneous Q4H PRN Parker, Alicia C, NP       Or   glycopyrrolate (ROBINUL) injection 0.2 mg  0.2 mg Intravenous Q4H PRN Parker, Alicia C, NP       haloperidol  (HALDOL ) 2 MG/ML solution 2 mg  2 mg Oral Q8H PRN Parker, Alicia C, NP       levothyroxine (SYNTHROID) tablet 50 mcg  50 mcg Oral QAC breakfast Shah, Pratik D, DO   50 mcg at 12/02/23 2353   memantine  (NAMENDA ) tablet 10 mg  10 mg Oral BID Shah, Pratik D, DO   10 mg at 12/02/23 2108   morphine CONCENTRATE 10 mg / 0.5 ml oral solution 5 mg  5 mg Sublingual Q4H PRN Parker, Alicia C, NP       ondansetron  (ZOFRAN ) tablet 4 mg  4 mg Oral Q6H PRN Mason Sole, Pratik D, DO       Or    ondansetron  (ZOFRAN ) injection 4 mg  4 mg Intravenous Q6H PRN Mason Sole, Pratik D, DO       QUEtiapine (SEROQUEL) tablet 25 mg  25 mg Oral QHS Parker, Alicia C, NP   25 mg at 12/02/23 2108   sertraline (ZOLOFT) tablet 50 mg  50 mg Oral Daily Mason Sole, Pratik D, DO   50 mg at 12/02/23 0911     Discharge Medications: Please see discharge summary for a list of discharge medications.  Allergies as of 12/03/2023       Reactions   Codeine    Guaifenesin    Zinc Sulfate [zinc]         Medication List     STOP taking these medications    aspirin EC 81 MG tablet   B-12 2500 MCG Tabs   CERTAVITE SENIOR PO   Janumet 50-1000 MG tablet Generic drug: sitaGLIPtin-metformin   Macrobid 100 MG capsule Generic drug: nitrofurantoin (macrocrystal-monohydrate)   RA Mineral Oil liquid Generic drug: mineral oil   Vitamin D3 50 MCG (2000 UT) Tabs       TAKE these medications    acetaminophen  500 MG tablet Commonly known as: TYLENOL  Take 500-1,000 mg by mouth every 6 (six) hours as needed for mild pain (pain score 1-3).   donepezil  5 MG tablet Commonly known as: ARICEPT  Take 5 mg by mouth in the morning.   levothyroxine 50 MCG tablet Commonly known as: SYNTHROID Take 25 mcg by mouth daily before breakfast.   linagliptin 5 MG Tabs tablet Commonly known as: Tradjenta Take 1 tablet (5 mg total) by mouth daily.   melatonin 5 MG Tabs Take 5 mg by mouth at bedtime.   memantine  10 MG tablet Commonly known as: Namenda  Take 1 tablet (10 mg total) by mouth 2 (two) times daily.   morphine CONCENTRATE 10 mg / 0.5 ml concentrated solution Place 0.25 mLs (5 mg total) under the tongue every 4 (four) hours as needed for severe pain (pain score 7-10) or shortness of breath.   multivitamin capsule Take 1 capsule by mouth daily.   QUEtiapine 25 MG tablet Commonly known as: SEROQUEL Take 1 tablet (25 mg total) by mouth at bedtime.   sertraline 50 MG tablet Commonly known as: ZOLOFT Take 50 mg  by mouth daily.   SYSTANE OP Apply 1 drop to eye in the morning and at bedtime.         Relevant Imaging Results:  Relevant Lab Results:   Additional Information Pt SSN:[id number]  Cyndie Dredge, LCSWA

## 2023-12-05 LAB — CULTURE, BLOOD (ROUTINE X 2): Special Requests: ADEQUATE

## 2024-01-06 DIAGNOSIS — M79675 Pain in left toe(s): Secondary | ICD-10-CM | POA: Diagnosis not present

## 2024-01-06 DIAGNOSIS — L600 Ingrowing nail: Secondary | ICD-10-CM | POA: Diagnosis not present

## 2024-01-08 DIAGNOSIS — R4182 Altered mental status, unspecified: Secondary | ICD-10-CM | POA: Diagnosis not present

## 2024-01-08 DIAGNOSIS — N390 Urinary tract infection, site not specified: Secondary | ICD-10-CM | POA: Diagnosis not present

## 2024-03-01 DEATH — deceased
